# Patient Record
Sex: Male | Born: 1955
Health system: Southern US, Community
[De-identification: ages and names within clinical notes are randomized; demographics above are authoritative.]

## PROBLEM LIST (undated history)

## (undated) DIAGNOSIS — E785 Hyperlipidemia, unspecified: Secondary | ICD-10-CM

## (undated) DIAGNOSIS — I739 Peripheral vascular disease, unspecified: Secondary | ICD-10-CM

## (undated) DIAGNOSIS — E0590 Thyrotoxicosis, unspecified without thyrotoxic crisis or storm: Secondary | ICD-10-CM

## (undated) DIAGNOSIS — I4891 Unspecified atrial fibrillation: Secondary | ICD-10-CM

## (undated) DIAGNOSIS — N189 Chronic kidney disease, unspecified: Secondary | ICD-10-CM

## (undated) HISTORY — PX: OTHER SURGICAL HISTORY: SHX169

## (undated) HISTORY — PX: PERICARDIAL FLUID DRAINAGE: SHX5100

## (undated) HISTORY — PX: BOWEL RESECTION: SHX1257

## (undated) HISTORY — PX: COLOSTOMY: SHX63

## (undated) HISTORY — PX: ABDOMINAL AORTIC ANEURYSM REPAIR: SUR1152

## (undated) HISTORY — PX: GASTROSTOMY: SHX151

## (undated) HISTORY — PX: ABOVE KNEE LEG AMPUTATION: SUR20

---

## 2019-10-06 DIAGNOSIS — I714 Abdominal aortic aneurysm, without rupture, unspecified: Secondary | ICD-10-CM | POA: Insufficient documentation

## 2019-10-06 DIAGNOSIS — N184 Chronic kidney disease, stage 4 (severe): Secondary | ICD-10-CM | POA: Insufficient documentation

## 2019-10-10 DIAGNOSIS — I743 Embolism and thrombosis of arteries of the lower extremities: Secondary | ICD-10-CM | POA: Insufficient documentation

## 2019-10-23 DIAGNOSIS — Z89612 Acquired absence of left leg above knee: Secondary | ICD-10-CM | POA: Insufficient documentation

## 2020-01-30 ENCOUNTER — Other Ambulatory Visit: Payer: Self-pay

## 2020-01-30 ENCOUNTER — Encounter (HOSPITAL_COMMUNITY): Payer: Self-pay

## 2020-01-30 DIAGNOSIS — L97419 Non-pressure chronic ulcer of right heel and midfoot with unspecified severity: Secondary | ICD-10-CM | POA: Diagnosis present

## 2020-01-30 DIAGNOSIS — N184 Chronic kidney disease, stage 4 (severe): Secondary | ICD-10-CM | POA: Diagnosis present

## 2020-01-30 DIAGNOSIS — I9589 Other hypotension: Secondary | ICD-10-CM | POA: Diagnosis present

## 2020-01-30 DIAGNOSIS — N170 Acute kidney failure with tubular necrosis: Principal | ICD-10-CM | POA: Diagnosis present

## 2020-01-30 DIAGNOSIS — Z87891 Personal history of nicotine dependence: Secondary | ICD-10-CM

## 2020-01-30 DIAGNOSIS — I4891 Unspecified atrial fibrillation: Secondary | ICD-10-CM | POA: Diagnosis present

## 2020-01-30 DIAGNOSIS — I96 Gangrene, not elsewhere classified: Secondary | ICD-10-CM | POA: Diagnosis present

## 2020-01-30 DIAGNOSIS — E875 Hyperkalemia: Secondary | ICD-10-CM | POA: Diagnosis present

## 2020-01-30 DIAGNOSIS — Y846 Urinary catheterization as the cause of abnormal reaction of the patient, or of later complication, without mention of misadventure at the time of the procedure: Secondary | ICD-10-CM | POA: Diagnosis present

## 2020-01-30 DIAGNOSIS — E860 Dehydration: Secondary | ICD-10-CM | POA: Diagnosis present

## 2020-01-30 DIAGNOSIS — Z20822 Contact with and (suspected) exposure to covid-19: Secondary | ICD-10-CM | POA: Diagnosis present

## 2020-01-30 DIAGNOSIS — Z66 Do not resuscitate: Secondary | ICD-10-CM | POA: Diagnosis present

## 2020-01-30 DIAGNOSIS — T83518A Infection and inflammatory reaction due to other urinary catheter, initial encounter: Secondary | ICD-10-CM | POA: Diagnosis present

## 2020-01-30 DIAGNOSIS — E785 Hyperlipidemia, unspecified: Secondary | ICD-10-CM | POA: Diagnosis present

## 2020-01-30 DIAGNOSIS — Z79899 Other long term (current) drug therapy: Secondary | ICD-10-CM

## 2020-01-30 DIAGNOSIS — Z933 Colostomy status: Secondary | ICD-10-CM

## 2020-01-30 DIAGNOSIS — R64 Cachexia: Secondary | ICD-10-CM | POA: Diagnosis present

## 2020-01-30 DIAGNOSIS — D638 Anemia in other chronic diseases classified elsewhere: Secondary | ICD-10-CM | POA: Diagnosis present

## 2020-01-30 DIAGNOSIS — E871 Hypo-osmolality and hyponatremia: Secondary | ICD-10-CM | POA: Diagnosis present

## 2020-01-30 DIAGNOSIS — L89322 Pressure ulcer of left buttock, stage 2: Secondary | ICD-10-CM | POA: Diagnosis present

## 2020-01-30 DIAGNOSIS — E43 Unspecified severe protein-calorie malnutrition: Secondary | ICD-10-CM | POA: Diagnosis present

## 2020-01-30 DIAGNOSIS — E872 Acidosis: Secondary | ICD-10-CM | POA: Diagnosis present

## 2020-01-30 DIAGNOSIS — L89312 Pressure ulcer of right buttock, stage 2: Secondary | ICD-10-CM | POA: Diagnosis present

## 2020-01-30 DIAGNOSIS — I739 Peripheral vascular disease, unspecified: Secondary | ICD-10-CM | POA: Diagnosis present

## 2020-01-30 DIAGNOSIS — I129 Hypertensive chronic kidney disease with stage 1 through stage 4 chronic kidney disease, or unspecified chronic kidney disease: Secondary | ICD-10-CM | POA: Diagnosis present

## 2020-01-30 DIAGNOSIS — Z681 Body mass index (BMI) 19 or less, adult: Secondary | ICD-10-CM

## 2020-01-30 DIAGNOSIS — N390 Urinary tract infection, site not specified: Secondary | ICD-10-CM | POA: Diagnosis present

## 2020-01-30 DIAGNOSIS — Z89612 Acquired absence of left leg above knee: Secondary | ICD-10-CM

## 2020-01-30 NOTE — ED Triage Notes (Signed)
Pt BIB EMS from [facility]. Pt had a urinary catheter x4 months which was removed today buy urologist. Per facility pt has not voided since the catheter was removed. Pt denies pain and no obvious distention.

## 2020-01-31 ENCOUNTER — Inpatient Hospital Stay (HOSPITAL_COMMUNITY)
Admission: EM | Admit: 2020-01-31 | Discharge: 2020-02-06 | DRG: 682 | Disposition: A | Discharge status: Skilled Nursing Facility | Payer: BLUE CROSS/BLUE SHIELD | Source: Skilled Nursing Facility | Attending: Internal Medicine | Admitting: Internal Medicine

## 2020-01-31 ENCOUNTER — Encounter (HOSPITAL_COMMUNITY): Payer: Self-pay | Admitting: Emergency Medicine

## 2020-01-31 ENCOUNTER — Emergency Department (HOSPITAL_COMMUNITY): Payer: BLUE CROSS/BLUE SHIELD

## 2020-01-31 DIAGNOSIS — N170 Acute kidney failure with tubular necrosis: Secondary | ICD-10-CM | POA: Diagnosis present

## 2020-01-31 DIAGNOSIS — S31109D Unspecified open wound of abdominal wall, unspecified quadrant without penetration into peritoneal cavity, subsequent encounter: Secondary | ICD-10-CM | POA: Diagnosis not present

## 2020-01-31 DIAGNOSIS — Y846 Urinary catheterization as the cause of abnormal reaction of the patient, or of later complication, without mention of misadventure at the time of the procedure: Secondary | ICD-10-CM | POA: Diagnosis present

## 2020-01-31 DIAGNOSIS — Z79899 Other long term (current) drug therapy: Secondary | ICD-10-CM | POA: Diagnosis not present

## 2020-01-31 DIAGNOSIS — I4891 Unspecified atrial fibrillation: Secondary | ICD-10-CM | POA: Diagnosis present

## 2020-01-31 DIAGNOSIS — Z89612 Acquired absence of left leg above knee: Secondary | ICD-10-CM | POA: Diagnosis not present

## 2020-01-31 DIAGNOSIS — E43 Unspecified severe protein-calorie malnutrition: Secondary | ICD-10-CM | POA: Insufficient documentation

## 2020-01-31 DIAGNOSIS — N184 Chronic kidney disease, stage 4 (severe): Secondary | ICD-10-CM | POA: Diagnosis present

## 2020-01-31 DIAGNOSIS — R64 Cachexia: Secondary | ICD-10-CM | POA: Diagnosis present

## 2020-01-31 DIAGNOSIS — N179 Acute kidney failure, unspecified: Secondary | ICD-10-CM | POA: Diagnosis not present

## 2020-01-31 DIAGNOSIS — E872 Acidosis: Secondary | ICD-10-CM | POA: Diagnosis present

## 2020-01-31 DIAGNOSIS — Z933 Colostomy status: Secondary | ICD-10-CM | POA: Diagnosis not present

## 2020-01-31 DIAGNOSIS — R319 Hematuria, unspecified: Secondary | ICD-10-CM | POA: Diagnosis not present

## 2020-01-31 DIAGNOSIS — Z681 Body mass index (BMI) 19 or less, adult: Secondary | ICD-10-CM | POA: Diagnosis not present

## 2020-01-31 DIAGNOSIS — S3120XD Unspecified open wound of penis, subsequent encounter: Secondary | ICD-10-CM

## 2020-01-31 DIAGNOSIS — E871 Hypo-osmolality and hyponatremia: Secondary | ICD-10-CM | POA: Diagnosis present

## 2020-01-31 DIAGNOSIS — E860 Dehydration: Secondary | ICD-10-CM | POA: Diagnosis present

## 2020-01-31 DIAGNOSIS — Z932 Ileostomy status: Secondary | ICD-10-CM

## 2020-01-31 DIAGNOSIS — N19 Unspecified kidney failure: Secondary | ICD-10-CM

## 2020-01-31 DIAGNOSIS — E875 Hyperkalemia: Secondary | ICD-10-CM

## 2020-01-31 DIAGNOSIS — N390 Urinary tract infection, site not specified: Secondary | ICD-10-CM

## 2020-01-31 DIAGNOSIS — Z66 Do not resuscitate: Secondary | ICD-10-CM | POA: Diagnosis present

## 2020-01-31 DIAGNOSIS — I9589 Other hypotension: Secondary | ICD-10-CM | POA: Diagnosis present

## 2020-01-31 DIAGNOSIS — Z87891 Personal history of nicotine dependence: Secondary | ICD-10-CM | POA: Diagnosis not present

## 2020-01-31 DIAGNOSIS — T83518A Infection and inflammatory reaction due to other urinary catheter, initial encounter: Secondary | ICD-10-CM | POA: Diagnosis present

## 2020-01-31 DIAGNOSIS — L97419 Non-pressure chronic ulcer of right heel and midfoot with unspecified severity: Secondary | ICD-10-CM | POA: Diagnosis present

## 2020-01-31 DIAGNOSIS — Z20822 Contact with and (suspected) exposure to covid-19: Secondary | ICD-10-CM | POA: Diagnosis present

## 2020-01-31 DIAGNOSIS — L8990 Pressure ulcer of unspecified site, unspecified stage: Secondary | ICD-10-CM | POA: Insufficient documentation

## 2020-01-31 DIAGNOSIS — D649 Anemia, unspecified: Secondary | ICD-10-CM

## 2020-01-31 DIAGNOSIS — E785 Hyperlipidemia, unspecified: Secondary | ICD-10-CM | POA: Diagnosis present

## 2020-01-31 DIAGNOSIS — I96 Gangrene, not elsewhere classified: Secondary | ICD-10-CM

## 2020-01-31 DIAGNOSIS — I129 Hypertensive chronic kidney disease with stage 1 through stage 4 chronic kidney disease, or unspecified chronic kidney disease: Secondary | ICD-10-CM | POA: Diagnosis present

## 2020-01-31 DIAGNOSIS — R34 Anuria and oliguria: Secondary | ICD-10-CM | POA: Diagnosis not present

## 2020-01-31 DIAGNOSIS — I739 Peripheral vascular disease, unspecified: Secondary | ICD-10-CM | POA: Diagnosis present

## 2020-01-31 DIAGNOSIS — S3120XA Unspecified open wound of penis, initial encounter: Secondary | ICD-10-CM | POA: Diagnosis present

## 2020-01-31 HISTORY — DX: Thyrotoxicosis, unspecified without thyrotoxic crisis or storm: E05.90

## 2020-01-31 HISTORY — DX: Chronic kidney disease, unspecified: N18.9

## 2020-01-31 HISTORY — DX: Peripheral vascular disease, unspecified: I73.9

## 2020-01-31 HISTORY — DX: Hyperlipidemia, unspecified: E78.5

## 2020-01-31 HISTORY — DX: Unspecified atrial fibrillation: I48.91

## 2020-01-31 LAB — URINALYSIS, ROUTINE W REFLEX MICROSCOPIC
Bilirubin Urine: NEGATIVE
Glucose, UA: NEGATIVE mg/dL
Ketones, ur: NEGATIVE mg/dL
Nitrite: NEGATIVE
Protein, ur: 100 mg/dL — AB
RBC / HPF: >50 RBC/hpf — ABNORMAL HIGH (ref 0–5)
Specific Gravity, Urine: 1.012 (ref 1.005–1.030)
WBC, UA: >50 WBC/hpf — ABNORMAL HIGH (ref 0–5)
pH: 7 (ref 5.0–8.0)

## 2020-01-31 LAB — CBC WITH DIFFERENTIAL/PLATELET
Abs Immature Granulocytes: 0.11 10*3/uL — ABNORMAL HIGH (ref 0.00–0.07)
Basophils Absolute: 0.1 10*3/uL (ref 0.0–0.1)
Basophils Relative: 1 %
Eosinophils Absolute: 0.4 10*3/uL (ref 0.0–0.5)
Eosinophils Relative: 4 %
HCT: 33.4 % — ABNORMAL LOW (ref 39.0–52.0)
Hemoglobin: 10.2 g/dL — ABNORMAL LOW (ref 13.0–17.0)
Immature Granulocytes: 1 %
Lymphocytes Relative: 12 %
Lymphs Abs: 1.1 10*3/uL (ref 0.7–4.0)
MCH: 27.2 pg (ref 26.0–34.0)
MCHC: 30.5 g/dL (ref 30.0–36.0)
MCV: 89.1 fL (ref 80.0–100.0)
Monocytes Absolute: 0.5 10*3/uL (ref 0.1–1.0)
Monocytes Relative: 6 %
Neutro Abs: 6.7 10*3/uL (ref 1.7–7.7)
Neutrophils Relative %: 76 %
Platelets: 238 10*3/uL (ref 150–400)
RBC: 3.75 MIL/uL — ABNORMAL LOW (ref 4.22–5.81)
RDW: 15.9 % — ABNORMAL HIGH (ref 11.5–15.5)
WBC: 8.8 10*3/uL (ref 4.0–10.5)
nRBC: 0 % (ref 0.0–0.2)

## 2020-01-31 LAB — BASIC METABOLIC PANEL
Anion gap: 16 — ABNORMAL HIGH (ref 5–15)
Anion gap: 16 — ABNORMAL HIGH (ref 5–15)
BUN: 108 mg/dL — ABNORMAL HIGH (ref 8–23)
BUN: 123 mg/dL — ABNORMAL HIGH (ref 8–23)
CO2: 18 mmol/L — ABNORMAL LOW (ref 22–32)
CO2: 20 mmol/L — ABNORMAL LOW (ref 22–32)
Calcium: 10.5 mg/dL — ABNORMAL HIGH (ref 8.9–10.3)
Calcium: 10.8 mg/dL — ABNORMAL HIGH (ref 8.9–10.3)
Chloride: 93 mmol/L — ABNORMAL LOW (ref 98–111)
Chloride: 93 mmol/L — ABNORMAL LOW (ref 98–111)
Creatinine, Ser: 10.99 mg/dL — ABNORMAL HIGH (ref 0.61–1.24)
Creatinine, Ser: 10.34 mg/dL — ABNORMAL HIGH (ref 0.61–1.24)
GFR, Estimated: 5 mL/min — ABNORMAL LOW (ref >60)
GFR, Estimated: 5 mL/min — ABNORMAL LOW (ref >60)
Glucose, Bld: 76 mg/dL (ref 70–99)
Glucose, Bld: 59 mg/dL — ABNORMAL LOW (ref 70–99)
Potassium: 6.8 mmol/L (ref 3.5–5.1)
Potassium: 6.1 mmol/L — ABNORMAL HIGH (ref 3.5–5.1)
Sodium: 127 mmol/L — ABNORMAL LOW (ref 135–145)
Sodium: 129 mmol/L — ABNORMAL LOW (ref 135–145)

## 2020-01-31 LAB — CBG MONITORING, ED: Glucose-Capillary: 67 mg/dL — ABNORMAL LOW (ref 70–99)

## 2020-01-31 LAB — RENAL FUNCTION PANEL
Albumin: 2.9 g/dL — ABNORMAL LOW (ref 3.5–5.0)
Anion gap: 14 (ref 5–15)
BUN: 119 mg/dL — ABNORMAL HIGH (ref 8–23)
CO2: 20 mmol/L — ABNORMAL LOW (ref 22–32)
Calcium: 10.1 mg/dL (ref 8.9–10.3)
Chloride: 95 mmol/L — ABNORMAL LOW (ref 98–111)
Creatinine, Ser: 10.34 mg/dL — ABNORMAL HIGH (ref 0.61–1.24)
GFR, Estimated: 5 mL/min — ABNORMAL LOW (ref >60)
Glucose, Bld: 60 mg/dL — ABNORMAL LOW (ref 70–99)
Phosphorus: 5.6 mg/dL — ABNORMAL HIGH (ref 2.5–4.6)
Potassium: 6.5 mmol/L (ref 3.5–5.1)
Sodium: 129 mmol/L — ABNORMAL LOW (ref 135–145)

## 2020-01-31 LAB — HIV ANTIBODY (ROUTINE TESTING W REFLEX): HIV Screen 4th Generation wRfx: NONREACTIVE

## 2020-01-31 LAB — CK: Total CK: 38 U/L — ABNORMAL LOW (ref 49–397)

## 2020-01-31 LAB — SARS CORONAVIRUS 2 BY RT PCR (HOSPITAL ORDER, PERFORMED IN ~~LOC~~ HOSPITAL LAB): SARS Coronavirus 2: NEGATIVE

## 2020-01-31 MED ORDER — ONDANSETRON HCL 4 MG PO TABS
4.0000 mg | ORAL_TABLET | Freq: Four times a day (QID) | ORAL | Status: DC | PRN
  Administered 2020-02-01 – 2020-02-02 (×2): 4 mg via ORAL
  Filled 2020-01-31 (×2): qty 1

## 2020-01-31 MED ORDER — SODIUM CHLORIDE 0.9 % IV SOLN: INTRAVENOUS | Status: DC

## 2020-01-31 MED ORDER — SODIUM CHLORIDE 0.9 % IV SOLN
1.0000 g | Freq: Once | INTRAVENOUS | Status: AC
  Administered 2020-01-31: 1 g via INTRAVENOUS
  Filled 2020-01-31: qty 1

## 2020-01-31 MED ORDER — ZINC OXIDE 40 % EX OINT
TOPICAL_OINTMENT | Freq: Two times a day (BID) | CUTANEOUS | Status: DC
  Filled 2020-01-31: qty 57

## 2020-01-31 MED ORDER — INSULIN ASPART 100 UNIT/ML IV SOLN
5.0000 [IU] | Freq: Once | INTRAVENOUS | Status: AC
  Administered 2020-01-31: 5 [IU] via INTRAVENOUS

## 2020-01-31 MED ORDER — SODIUM CHLORIDE 0.9 % IV SOLN: 1.0000 g | INTRAVENOUS | Status: DC

## 2020-01-31 MED ORDER — DEXTROSE 50 % IV SOLN
50.0000 mL | Freq: Once | INTRAVENOUS | Status: AC
  Administered 2020-01-31: 50 mL via INTRAVENOUS
  Filled 2020-01-31: qty 50

## 2020-01-31 MED ORDER — ONDANSETRON HCL 4 MG/2ML IJ SOLN
4.0000 mg | Freq: Four times a day (QID) | INTRAMUSCULAR | Status: DC | PRN
  Administered 2020-02-01 – 2020-02-04 (×3): 4 mg via INTRAVENOUS
  Filled 2020-01-31 (×3): qty 2

## 2020-01-31 MED ORDER — ACETAMINOPHEN 325 MG PO TABS
650.0000 mg | ORAL_TABLET | Freq: Four times a day (QID) | ORAL | Status: DC | PRN
  Administered 2020-01-31 – 2020-02-06 (×6): 650 mg via ORAL
  Filled 2020-01-31 (×6): qty 2

## 2020-01-31 MED ORDER — ACETAMINOPHEN 650 MG RE SUPP
650.0000 mg | Freq: Four times a day (QID) | RECTAL | Status: DC | PRN
  Filled 2020-01-31: qty 1

## 2020-01-31 MED ORDER — INSULIN ASPART 100 UNIT/ML IV SOLN: 10.0000 [IU] | Freq: Once | INTRAVENOUS | Status: DC

## 2020-01-31 MED ORDER — INSULIN ASPART 100 UNIT/ML IV SOLN
10.0000 [IU] | Freq: Once | INTRAVENOUS | Status: AC
  Administered 2020-01-31: 10 [IU] via INTRAVENOUS
  Filled 2020-01-31: qty 0.1

## 2020-01-31 MED ORDER — SODIUM ZIRCONIUM CYCLOSILICATE 10 G PO PACK
10.0000 g | PACK | Freq: Once | ORAL | Status: AC
  Administered 2020-01-31: 10 g via ORAL
  Filled 2020-01-31: qty 1

## 2020-01-31 MED ORDER — DEXTROSE 5 % IV SOLN
500.0000 mg | INTRAVENOUS | Status: DC
  Administered 2020-02-01 – 2020-02-02 (×2): 500 mg via INTRAVENOUS
  Filled 2020-01-31 (×4): qty 0.5

## 2020-01-31 MED ORDER — CHLORHEXIDINE GLUCONATE CLOTH 2 % EX PADS
6.0000 | MEDICATED_PAD | Freq: Every day | CUTANEOUS | Status: DC
  Administered 2020-01-31 – 2020-02-06 (×7): 6 via TOPICAL

## 2020-01-31 MED ORDER — SODIUM CHLORIDE 0.9 % IV BOLUS
1500.0000 mL | Freq: Once | INTRAVENOUS | Status: AC
  Administered 2020-01-31: 1500 mL via INTRAVENOUS

## 2020-01-31 MED ORDER — CALCIUM GLUCONATE-NACL 1-0.675 GM/50ML-% IV SOLN
1.0000 g | Freq: Once | INTRAVENOUS | Status: AC
  Administered 2020-01-31: 1000 mg via INTRAVENOUS
  Filled 2020-01-31: qty 50

## 2020-01-31 MED ORDER — SODIUM ZIRCONIUM CYCLOSILICATE 10 G PO PACK
10.0000 g | PACK | Freq: Two times a day (BID) | ORAL | Status: AC
  Administered 2020-01-31 – 2020-02-01 (×3): 10 g via ORAL
  Filled 2020-01-31 (×3): qty 1

## 2020-01-31 MED ORDER — SODIUM BICARBONATE 8.4 % IV SOLN
50.0000 meq | Freq: Once | INTRAVENOUS | Status: AC
  Administered 2020-01-31: 50 meq via INTRAVENOUS
  Filled 2020-01-31: qty 50

## 2020-01-31 NOTE — ED Notes (Addendum)
MD Paged for lab values, [place] also notified

## 2020-01-31 NOTE — ED Notes (Signed)
Pt son "[name]" at [phone]. Would like update with results and a call at discharge. Pt gives permission.

## 2020-01-31 NOTE — Progress Notes (Addendum)
Patient seen and examined, admitted earlier this morning by Dr. Alcario Drought, please see his H&P for details, briefly patient is a 65 year old male with history of hypertension, PAD, tobacco abuse who was admitted to [facility] in mid September 2021 for repair of abdominal aortic aneurysm 9/17 this led to numerous complications including, embolization of aortic thrombus to the left lower extremity eventually requiring left above-knee amputation. -Also complicated by acute kidney injury, rhabdomyolysis requiring CRRT from 9/18 started intermittent hemodialysis on 9/23. -10/12 had cardiac arrest due to tamponade requiring pericardial drain placement -Developed ischemic bowel, emergent OR underwent small bowel resection with open abdomen, went back to the OR on 10/17 had ileostomy placed. -On 10/19 had a Foley placed developed necrosis of foreskin with slough, required blunt dissection to place Foley catheter -On 10/24 was extubated -11/1 had tunneled HD catheter placement -Then had gradual improvement in his kidney function and was transitioned off hemodialysis, creatinine was in the 3 range at discharge Eventually discharged to [facility] for rehab on 11/15 with a Foley catheter -Seen by urology in follow-up 1/20 and had Foley catheter removed after he passed a voiding trial and was discharged back to [facility], patient was unable to void 6 hours later and was sent to our ED. -In the emergency room he was noted to have BUN of 108 with a creatinine of 10.9, potassium of 6.8, renal ultrasound did not show hydronephrosis,, did have a Foley catheter placed with 200 cc urine drained, also noted to have necrotic changes in his right foot/toes. -Nephrology was consulted and he was transferred to [facility] from [place], ER.  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 Hyperkalemia -Baseline creatinine around 3 at the time of discharge -Creatinine 10.9 on admission with K of 6.8 -Lokelma ordered, repeat labs pending,  will give insulin and dextrose as well -Clinically appears dry, start normal saline at 100 mL/h, monitor urine output closely, I wonder if this is due to high output ostomy, monitor output -Renal ultrasound without hydronephrosis, CK is 93 -Nephrology consulting -Foley catheter placed in the ED overnight, will continue this, monitor I/o closely  Necrotic toes with wounds, serosanguineous discharge -weak DP pulse noted -Followed by vascular surgery at [facility], will request to VVS input  Abdominal wall wound Penis wound -Underwent small bowel resection for ischemia, R iIeostomy -Wound care consult, follow-up with VVS  History of A. Fib -Noted during recent hospitalization -In sinus rhythm now, not on anticoagulation at discharge I wonder if this was secondary to tamponade or some bleeding complications -Follow-up with cardiology at [place] associated UTI -Catheter was just removed prior to this admission, UA is abnormal, continue cefepime, follow-up urine culture  History of cardiac arrest, pericardial tamponade -Requiring pericardial window and drain  DNR  Domenic Polite, MD

## 2020-01-31 NOTE — Progress Notes (Signed)
Pharmacy Antibiotic Note  Curtis Solis is a 65 y.o. male admitted on 01/31/2020 with CAUTI; catheter was removed yesterday after being placed 22moprior.  Pharmacy has been consulted for cefepime dosing.  Of note, pt w/ acute on CKD; had been on temporary HD during previous admission but not yet on permanent HD.  Plan: Cefepime 1g IV x1 followed by '500mg'$  IV Q24H; f/u whether HD to start during admission.  Height: '5\' 11"'$  (180.3 cm) Weight: 68 kg (150 lb) IBW/kg (Calculated) : 75.3  Temp (24hrs), Avg:97.6 F (36.4 C), Min:97.5 F (36.4 C), Max:97.6 F (36.4 C)  Recent Labs  Lab 01/31/20 0236  WBC 8.8  CREATININE 10.99*    Estimated Creatinine Clearance: 6.7 mL/min (A) (by C-G formula based on SCr of 10.99 mg/dL (H)).    No Known Allergies   Thank you for allowing pharmacy to be a part of this patient's care.  VWynona Neat PharmD, BCPS  01/31/2020 6:44 AM

## 2020-01-31 NOTE — Consult Note (Addendum)
Hospital Consult    Reason for Consult: Evaluation of right foot Requesting Physician:  ED MRN #:  [id number]  History of Present Illness: This is a 65 y.o. male status post open AAA and bilateral common iliac artery aneurysm repair with aorta bi-iliac bypass graft in September of last year at [facility].  Postoperative course was complicated by acute renal failure, mesenteric ischemia, sepsis, and distal embolization to the left lower extremity ultimately requiring left above-knee amputation.  He is being seen in consultation for evaluation of right foot wounds.  He denies any pain to the toes of his right foot.  He does have pain on his right heel and has been working on avoiding pressure.  He states the toes and heel are slowly improving.  He has been admitted to the hospital due to renal failure and hyperkalemia.  Past Medical History:  Diagnosis Date  . A-fib [facility])    During hospital stay  . CKD (chronic kidney disease)   . Hyperlipidemia   . Hyperthyroidism   . PVD (peripheral vascular disease) ([place])     Past Surgical History:  Procedure Laterality Date  . ABDOMINAL AORTIC ANEURYSM REPAIR    . ABOVE KNEE LEG AMPUTATION Left   . BOWEL RESECTION     for dead bowel, s/p open abdomen  . COLOSTOMY    . GASTROSTOMY    . left leg amputation    . PERICARDIAL FLUID DRAINAGE     Placed for tamponade and removed during the [facility] hospitalization    No Known Allergies  Prior to Admission medications   Medication Sig Start Date End Date Taking? Authorizing Provider  atorvastatin (LIPITOR) 40 MG tablet Take 40 mg by mouth daily.   Yes [provider]  calcium carbonate (TUMS - DOSED IN MG ELEMENTAL CALCIUM) 500 MG chewable tablet Chew 1 tablet by mouth every 8 (eight) hours as needed for indigestion or heartburn.   Yes [provider]  diphenoxylate-atropine (LOMOTIL) 2.5-0.025 MG tablet Take 1 tablet by mouth every 6 (six) hours. 6 AM, 12 noon, 6 PM and 12 AM    Yes [provider]  gabapentin (NEURONTIN) 100 MG capsule Take 100 mg by mouth at bedtime.   Yes [provider]  hydrOXYzine (ATARAX/VISTARIL) 10 MG tablet Take 10 mg by mouth every 6 (six) hours as needed for anxiety.   Yes [provider]  ipratropium-albuterol (DUONEB) 0.5-2.5 (3) MG/3ML SOLN Take 3 mLs by nebulization every 6 (six) hours as needed (wheezing/ cough).   Yes [provider]  loperamide (IMODIUM A-D) 2 MG tablet Take 2 mg by mouth 4 (four) times daily as needed for diarrhea or loose stools.   Yes [provider]  metoprolol tartrate (LOPRESSOR) 25 MG tablet Take 12.5 mg by mouth 2 (two) times daily. 9 AM and 9 PM   Yes [provider]  OXYCODONE HCL PO Take 5 mg by mouth every 4 (four) hours as needed (pain).   Yes [provider]  pantoprazole (PROTONIX) 20 MG tablet Take 20 mg by mouth daily.   Yes [provider]  QUEtiapine (SEROQUEL) 25 MG tablet Take 12.5 mg by mouth at bedtime.   Yes [provider]  simethicone (MYLICON) 80 MG chewable tablet Chew 80 mg by mouth every 8 (eight) hours as needed for flatulence.   Yes [provider]  sodium bicarbonate 650 MG tablet Take 650 mg by mouth daily.   Yes [provider]  traMADol Veatrice Bourbon) 50  MG tablet Take 50 mg by mouth every 8 (eight) hours. 6 AM, 2 PM, 10 PM   Yes [provider]    Social History   Socioeconomic History  . Marital status: Married    Spouse name: Not on file  . Number of children: Not on file  . Years of education: Not on file  . Highest education level: Not on file  Occupational History  . Not on file  Tobacco Use  . Smoking status: Former Smoker    Quit date: 09/26/2019    Years since quitting: 0.3  . Smokeless tobacco: Not on file  Substance and Sexual Activity  . Alcohol use: Yes  . Drug use: Not on file  . Sexual activity: Not on file  Other Topics Concern  . Not on file  Social  History Narrative  . Not on file   Social Determinants of Health   Financial Resource Strain: Not on file  Food Insecurity: Not on file  Transportation Needs: Not on file  Physical Activity: Not on file  Stress: Not on file  Social Connections: Not on file  Intimate Partner Violence: Not on file     No family history on file.  ROS: Otherwise negative unless mentioned in HPI  Physical Examination  Vitals:   01/31/20 1215 01/31/20 1245  BP: (!) 91/59 102/60  Pulse:  76  Resp: 13 11  Temp:    SpO2:  100%   Body mass index is 20.92 kg/m.  General:  WDWN in NAD Gait: Not observed HENT: WNL, normocephalic Pulmonary: normal non-labored breathing, without Rales, rhonchi,  wheezing Cardiac: regular Abdomen:  soft, NT/ND, no masses Skin: without rashes Vascular Exam/Pulses: Palpable right DP and PT pulse Extremities: Left AKA well-healed; right thigh skin graft donor sites appear clean with healthy wound bed; gangrenous toes right foot and right heel ulceration with thick drainage Musculoskeletal: no muscle wasting or atrophy  Neurologic: A&O X 3;  No focal weakness or paresthesias are detected; speech is fluent/normal Psychiatric:  The pt has Normal affect. Lymph:  Unremarkable  CBC    Component Value Date/Time   WBC 8.8 01/31/2020 0236   RBC 3.75 (L) 01/31/2020 0236   HGB 10.2 (L) 01/31/2020 0236   HCT 33.4 (L) 01/31/2020 0236   PLT 238 01/31/2020 0236   MCV 89.1 01/31/2020 0236   MCH 27.2 01/31/2020 0236   MCHC 30.5 01/31/2020 0236   RDW 15.9 (H) 01/31/2020 0236   LYMPHSABS 1.1 01/31/2020 0236   MONOABS 0.5 01/31/2020 0236   EOSABS 0.4 01/31/2020 0236   BASOSABS 0.1 01/31/2020 0236    BMET    Component Value Date/Time   NA 129 (L) 01/31/2020 0923   K 6.1 (H) 01/31/2020 0923   CL 93 (L) 01/31/2020 0923   CO2 20 (L) 01/31/2020 0923   GLUCOSE 59 (L) 01/31/2020 0923   BUN 123 (H) 01/31/2020 0923   CREATININE 10.34 (H) 01/31/2020 0923   CALCIUM 10.8 (H)  01/31/2020 0923   GFRNONAA 5 (L) 01/31/2020 0923    COAGS: No results found for: INR, PROTIME   ASSESSMENT/PLAN: This is a 65 y.o. male is 4 months status post open AAA and bilateral common iliac artery aneurysm repair with complicated postoperative course.  Vascular was consulted for evaluation of right foot wounds  -Right lower extremity is well-perfused with palpable DP and PT pulses -Patient will require aggressive wound care.  Wound care will include offloading right heel; twice daily cleansing with soap and water of  right foot and toes of right foot followed by Betadine paint and gauze separating each toe which should be changed daily -Patient is aware he is at risk for partial foot amputation however we will attempt to get through current hospitalization with aggressive wound care and have patient follow up with his surgeon in [facility] as an outpatient.  Dagoberto Ligas PA-C [facility] [phone]  I have interviewed the patient and examined the patient. I agree with the findings by the PA.  He has palpable pedal pulses in the right foot.  As above we just need to keep the wounds dry and the toes separated.  Will allow this to demarcate and he will ultimately likely require transmetatarsal amputation.  He should follow-up in [facility] once he is discharged here.  Vascular surgery will be available as needed.  Gae Gallop, MD [phone]

## 2020-01-31 NOTE — ED Notes (Signed)
500 NS bag done 1000 now switched over

## 2020-01-31 NOTE — Consult Note (Addendum)
[facility] Nurse Consult Note: Patient receiving care in [place] [facility] today from [facility] Reason for Consult: Multiple wounds Wound type:  #1 Right ischium MASD/Friction/Sheer Pink, moist measures 1 cm x 1.2 cm  #2 Left ischium MASD/Friction/Sheer pink, moist 1 cm x 2.6 cm  #3 Right Heel Stage 3 related to pressure and ischemia, dry scaly skin, bleeding measures 3 cm x 4 cm. #4 Abdominal surgical wound has had skin grafts, brown, with scattered pink friable areas and brownish/yellow tissue.  #5 Right thigh skin graft donor site, pink moist #6 Right foot and toes, thickened scaly skin with necrotic toes,open wounds pink red with serosanguinous drainage, likely related to inadequate arterial blood flow to the foot. Vascular consult recommended.  #7 Healing wound on the tip of the urethral meatus of the penis Pressure Injury POA: Yes Dressing procedure/placement/frequency: #1 Clean the entire sacral region with no rinse cleaner, pat dry then apply Desitin to the area. Apply each shift or PRN soiling #2 Clean right foot thoroughly with soap and water, rinse, pat dry. Apply Aquacel Advantage Kellie Simmering # [phone]) to the toes by weaving in and out of each toe, place one piece of Aquacel on top of the toes and one piece under the toes, secure with Kerlix. Change daily. Place right foot in Prevalon heel lift boot Kellie Simmering # [phone]) #3 Apply Xeroform gauze Kellie Simmering # [phone]) over the heel, secure with Kerlix. Change daily #4 Gently clean the right thigh wound with soap and water, pat dry. Place Xeroform gauze over the wound, secure with ABD pad and Medipore tape. Change daily #5 Gently clean the abdominal wound with NS, pat dry. Apply Xeroform over the wound, secure with ABD pad and Medipore tape. Change daily.  Order and place patient on standard size air mattress.  [place] Nurse ostomy consult note This is not a new ostomy.  Stoma type/location: Right ileostomy Stomal assessment/size: 1 1/8" stoma just above skin  level.  Peristomal assessment: Pink, moist Output: thin brown Ostomy pouching: 1pc. 2 1/4" flat Kellie Simmering # [phone]) with barrier ring Kellie Simmering # [phone])  Education provided: None  Monitor the wound area(s) for worsening of condition such as: Signs/symptoms of infection, increase in size, development of or worsening of odor, development of pain, or increased pain at the affected locations.   Notify the medical team if any of these develop.  Thank you for the consult. [place] nurse will not follow at this time.   Please re-consult the [place] team if needed.  Cathlean Marseilles Tamala Julian, MSN, RN, [place], Lysle Pearl, [facility] Wound Treatment Associate Pager [phone]

## 2020-01-31 NOTE — ED Notes (Signed)
Pt given urinal and encouraged to try to void

## 2020-01-31 NOTE — ED Provider Notes (Signed)
[facility] DEPT Provider Note: Georgena Spurling, MD, FACEP  CSN: [id number] MRN: [id number] ARRIVAL: 01/30/20 at 2241 ROOM: 019C/019C   CHIEF COMPLAINT  Urinary Retention and Acute Renal Failure   HISTORY OF PRESENT ILLNESS  01/31/20 1:25 AM Curtis Solis is a 65 y.o. male who had an indwelling catheter for 4 months which was removed by his urologist in [facility] yesterday.  He was able to spontaneously void 150 mL after removal of the catheter.  Per his living facility he has not voided for 6 hours since the catheter was removed, and his urologist note indicates that if this occurred he was to be sent to the ED.  The patient denies pain or need to void and he has no obvious bladder distention.  Urologist note indicates he has a chronic open wound of the penis.   Apparently urology was consulted during hospitalization (beginning September 16, 2019) for repair of abdominal aortic aneurysm with bilateral common iliac aneurysms.  His hospital course included multiple complications including placement of colostomy, gastrostomy, and skin graft over the abdominal wound (right thigh donor site).  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  He was on temporary dialysis but is not currently on dialysis.  His only acute complaint is moderate pain in his right foot, which is bandaged; pain is worse with movement or palpation:    His son states he has had decreased appetite and oral intake since his hospital course and has lost weight.   Past Medical History:  Diagnosis Date   A-fib [facility])    During hospital stay   CKD (chronic kidney disease)    Hyperlipidemia    Hyperthyroidism    PVD (peripheral vascular disease) ([place])     Past Surgical History:  Procedure Laterality Date   ABDOMINAL AORTIC ANEURYSM REPAIR     ABOVE KNEE LEG AMPUTATION Left    BOWEL RESECTION     for dead bowel, s/p open abdomen   COLOSTOMY     GASTROSTOMY     left leg  amputation     PERICARDIAL FLUID DRAINAGE     Placed for tamponade and removed during the [facility] hospitalization    No family history on file.  Social History   Tobacco Use   Smoking status: Former Smoker    Quit date: 09/26/2019    Years since quitting: 0.3  Substance Use Topics   Alcohol use: Yes    Prior to Admission medications   Medication Sig Start Date End Date Taking? Authorizing Provider  atorvastatin (LIPITOR) 40 MG tablet Take 40 mg by mouth daily.   Yes [provider]  calcium carbonate (TUMS - DOSED IN MG ELEMENTAL CALCIUM) 500 MG chewable tablet Chew 1 tablet by mouth every 8 (eight) hours as needed for indigestion or heartburn.   Yes [provider]  diphenoxylate-atropine (LOMOTIL) 2.5-0.025 MG tablet Take 1 tablet by mouth every 6 (six) hours. 6 AM, 12 noon, 6 PM and 12 AM   Yes [provider]  gabapentin (NEURONTIN) 100 MG capsule Take 100 mg by mouth at bedtime.   Yes [provider]  hydrOXYzine (ATARAX/VISTARIL) 10 MG tablet Take 10 mg by mouth every 6 (six) hours as needed for anxiety.   Yes [provider]  ipratropium-albuterol (DUONEB) 0.5-2.5 (3) MG/3ML SOLN Take 3 mLs by nebulization every 6 (six) hours as needed (wheezing/ cough).   Yes [provider]  loperamide (IMODIUM A-D) 2 MG  tablet Take 2 mg by mouth 4 (four) times daily as needed for diarrhea or loose stools.   Yes [provider]  metoprolol tartrate (LOPRESSOR) 25 MG tablet Take 12.5 mg by mouth 2 (two) times daily. 9 AM and 9 PM   Yes [provider]  OXYCODONE HCL PO Take 5 mg by mouth every 4 (four) hours as needed (pain).   Yes [provider]  pantoprazole (PROTONIX) 20 MG tablet Take 20 mg by mouth daily.   Yes [provider]  QUEtiapine (SEROQUEL) 25 MG tablet Take 12.5 mg by mouth at bedtime.   Yes [provider]  simethicone (MYLICON) 80 MG chewable tablet Chew 80 mg by mouth every 8  (eight) hours as needed for flatulence.   Yes [provider]  sodium bicarbonate 650 MG tablet Take 650 mg by mouth daily.   Yes [provider]  traMADol (ULTRAM) 50 MG tablet Take 50 mg by mouth every 8 (eight) hours. 6 AM, 2 PM, 10 PM   Yes [provider]    Allergies Patient has no known allergies.   REVIEW OF SYSTEMS  Negative except as noted here or in the History of Present Illness.   PHYSICAL EXAMINATION  Initial Vital Signs Blood pressure 124/63, pulse 84, temperature 97.6 F (36.4 C), temperature source Oral, resp. rate 16, SpO2 100 %.  Examination General: Well-developed, cachectic male in no acute distress; appearance consistent with age of record HENT: normocephalic; atraumatic Eyes: pupils equal, round and reactive to light; extraocular muscles intact; arcus senilis Neck: supple Heart: regular rate and rhythm Lungs: clear to auscultation bilaterally Abdomen: soft; nondistended; bowel sounds present; gastrostomy present in the left upper quadrant; colostomy present in the right lower quadrant; chronically healing wound of mid abdomen with skin graft:    GU: Tanner V male, uncircumcised; healing wound of urethral meatus and glans penis; no tenderness or distention of bladder, bedside bladder scan shows about 115 mL of retained urine Extremities: Left AKA; healing skin graft donor site right anterior thigh without signs of infection:    Necrosis of toes of right foot with thickened, scaly skin and superficial ulcer of heel, DP pulse +1:      Neurologic: Awake, alert and oriented; motor function intact in all extremities and symmetric; no facial droop Skin: Warm and dry Psychiatric: Normal mood and affect   RESULTS  Summary of this visit's results, reviewed and interpreted by myself:   EKG Interpretation  Date/Time:  Friday January 31 2020 04:01:38 EST Ventricular Rate:  67 PR Interval:    QRS Duration: 135 QT  Interval:  406 QTC Calculation: 429 R Axis:   -32 Text Interpretation: Sinus rhythm Left bundle branch block Artifact in lead(s) I II III aVR aVL aVF V1 V5 No previous ECGs available Confirmed by Shanon Rosser [phone]) on 01/31/2020 4:15:37 AM      Laboratory Studies: Results for orders placed or performed during the hospital encounter of 01/31/20 (from the past 24 hour(s))  CBC with Differential/Platelet     Status: Abnormal   Collection Time: 01/31/20  2:36 AM  Result Value Ref Range   WBC 8.8 4.0 - 10.5 K/uL   RBC 3.75 (L) 4.22 - 5.81 MIL/uL   Hemoglobin 10.2 (L) 13.0 - 17.0 g/dL   HCT 33.4 (L) 39.0 - 52.0 %   MCV 89.1 80.0 - 100.0 fL   MCH 27.2 26.0 - 34.0 pg   MCHC 30.5 30.0 - 36.0 g/dL   RDW 15.9 (  H) 11.5 - 15.5 %   Platelets 238 150 - 400 K/uL   nRBC 0.0 0.0 - 0.2 %   Neutrophils Relative % 76 %   Neutro Abs 6.7 1.7 - 7.7 K/uL   Lymphocytes Relative 12 %   Lymphs Abs 1.1 0.7 - 4.0 K/uL   Monocytes Relative 6 %   Monocytes Absolute 0.5 0.1 - 1.0 K/uL   Eosinophils Relative 4 %   Eosinophils Absolute 0.4 0.0 - 0.5 K/uL   Basophils Relative 1 %   Basophils Absolute 0.1 0.0 - 0.1 K/uL   Immature Granulocytes 1 %   Abs Immature Granulocytes 0.11 (H) 0.00 - 0.07 K/uL  Basic metabolic panel     Status: Abnormal   Collection Time: 01/31/20  2:36 AM  Result Value Ref Range   Sodium 127 (L) 135 - 145 mmol/L   Potassium 6.8 (HH) 3.5 - 5.1 mmol/L   Chloride 93 (L) 98 - 111 mmol/L   CO2 18 (L) 22 - 32 mmol/L   Glucose, Bld 76 70 - 99 mg/dL   BUN 108 (H) 8 - 23 mg/dL   Creatinine, Ser 10.99 (H) 0.61 - 1.24 mg/dL   Calcium 10.5 (H) 8.9 - 10.3 mg/dL   GFR, Estimated 5 (L) >60 mL/min   Anion gap 16 (H) 5 - 15  CK     Status: Abnormal   Collection Time: 01/31/20  2:36 AM  Result Value Ref Range   Total CK 38 (L) 49 - 397 U/L  SARS Coronavirus 2 by RT PCR (hospital order, performed in [place] hospital lab) Nasopharyngeal Nasopharyngeal Swab     Status: None   Collection Time:  01/31/20  3:45 AM   Specimen: Nasopharyngeal Swab  Result Value Ref Range   SARS Coronavirus 2 NEGATIVE NEGATIVE  Urinalysis, Routine w reflex microscopic Urine, Catheterized     Status: Abnormal   Collection Time: 01/31/20  5:39 AM  Result Value Ref Range   Color, Urine YELLOW YELLOW   APPearance CLOUDY (A) CLEAR   Specific Gravity, Urine 1.012 1.005 - 1.030   pH 7.0 5.0 - 8.0   Glucose, UA NEGATIVE NEGATIVE mg/dL   Hgb urine dipstick LARGE (A) NEGATIVE   Bilirubin Urine NEGATIVE NEGATIVE   Ketones, ur NEGATIVE NEGATIVE mg/dL   Protein, ur 100 (A) NEGATIVE mg/dL   Nitrite NEGATIVE NEGATIVE   Leukocytes,Ua LARGE (A) NEGATIVE   RBC / HPF >50 (H) 0 - 5 RBC/hpf   WBC, UA >50 (H) 0 - 5 WBC/hpf   Bacteria, UA MANY (A) NONE SEEN   WBC Clumps PRESENT    Hyaline Casts, UA PRESENT    Imaging Studies: US RENAL  Result Date: 01/31/2020 CLINICAL DATA:  Initial evaluation for a KI, evaluate for obstructive uropathy/urinary retention. EXAM: RENAL / URINARY TRACT ULTRASOUND COMPLETE COMPARISON:  None available. FINDINGS: Right Kidney: Renal measurements: 10.6 x 4.6 x 5.2 cm = volume: 134 mL. Mildly increased echogenicity within the renal parenchyma. No nephrolithiasis or hydronephrosis. 3.2 cm simple cyst present at the upper pole. Left Kidney: Renal measurements: 11.5 x 4.5 x 4.4 cm = volume: 117 mL. Increased echogenicity within the renal parenchyma. No nephrolithiasis or hydronephrosis. 2.3 cm benign appearing cyst present at the upper pole. Additional 2.6 cm simple cyst present at the lower pole. Bladder: Diffuse irregularity of the bladder wall is seen. While this could reflect trabeculation related incomplete distension, a possible focal bladder mass is difficult to exclude (image 56). Prevoid volume measures 181 cc. Bilateral ureteral  jets are visualized. Other: None. IMPRESSION: 1. Increased echogenicity within the renal parenchyma, consistent with medical renal disease. 2. No hydronephrosis. 3.  Multifocal irregularity of the bladder wall. While this finding may reflect trabeculation related to incomplete distension, a possible focal bladder mass is difficult to exclude based on this appearance. Further assessment with dedicated cross-sectional imaging/CT urogram suggested for further evaluation. 4. Few scattered benign appearing renal cysts as above. Electronically Signed   By: Jeannine Boga M.D.   On: 01/31/2020 06:05   DG Foot Complete Right  Result Date: 01/31/2020 CLINICAL DATA:  Necrosis EXAM: RIGHT FOOT COMPLETE - 3+ VIEW COMPARISON:  None. FINDINGS: Hallux valgus. Degenerative change in the first MTP joint. Prior bunionectomy. Negative for fracture. No acute skeletal abnormality. Mild calcaneal spurring at the Achilles insertion. IMPRESSION: No acute abnormality. Electronically Signed   By: Franchot Gallo M.D.   On: 01/31/2020 02:47    ED COURSE and MDM  Nursing notes, initial and subsequent vitals signs, including pulse oximetry, reviewed and interpreted by myself.  Vitals:   01/31/20 0300 01/31/20 0408 01/31/20 0500 01/31/20 0635  BP: (!) 120/94 (!) 127/94 105/69   Pulse: 86 88 88   Resp: '16 11 14   '$ Temp:    (!) 97.5 F (36.4 C)  TempSrc:    Oral  SpO2: 98% 100% 100% 100%   Medications  acetaminophen (TYLENOL) tablet 650 mg (has no administration in time range)    Or  acetaminophen (TYLENOL) suppository 650 mg (has no administration in time range)  ondansetron (ZOFRAN) tablet 4 mg (has no administration in time range)    Or  ondansetron (ZOFRAN) injection 4 mg (has no administration in time range)  sodium zirconium cyclosilicate (LOKELMA) packet 10 g (10 g Oral Given 01/31/20 0411)  calcium gluconate 1 g/ 50 mL sodium chloride IVPB (0 g Intravenous Stopped 01/31/20 0502)  insulin aspart (novoLOG) injection 10 Units (10 Units Intravenous Given 01/31/20 0402)  dextrose 50 % solution 50 mL (50 mLs Intravenous Given 01/31/20 0403)  sodium bicarbonate injection 50 mEq  (50 mEq Intravenous Given 01/31/20 0351)   3:44 AM The patient is clearly in renal failure. This is likely the cause of his anuria as opposed to obstruction as his bladder is decompressed and he has no sensation of urgency to urinate. We will give Lokelma as well as other IV medications for acute treatment of hyperkalemia and have admitted with anticipation of dialysis.  5:22 AM Dr. Alcario Drought in to see patient.  Renal ultrasound obtained showing no hydronephrosis and only about 185 mL of urine in the bladder.  I do not believe his acute renal failure is due to obstruction.  Foley catheter placed by nursing staff.  Only about 50 mL of urine obtained.  This was sent for urinalysis.   5:36 AM Dr. Dayna Barker, EDP at [facility] accepts for ED to ED transfer.  Dr. Marval Regal consulted for nephrology as patient will need emergent dialysis.  6:33 AM Urinalysis shows hematuria and pyuria consistent with a urinary tract infection.  Dr. Alcario Drought will order Rocephin.  Urine sent for culture.  [facility] en [facility] to [facility].  CK is only 38, not consistent with rhabdomyolysis despite necrotic-appearing foot.  PROCEDURES  Procedures   CRITICAL CARE Performed by: Karen Chafe Kidus Delman Total critical care time: 60 minutes Critical care time was exclusive of separately billable procedures and treating other patients. Critical care was necessary to treat or prevent imminent or life-threatening deterioration. Critical care was time spent  personally by me on the following activities: development of treatment plan with patient and/or surrogate as well as nursing, discussions with consultants, evaluation of patient's response to treatment, examination of patient, obtaining history from patient or surrogate, ordering and performing treatments and interventions, ordering and review of laboratory studies, ordering and review of radiographic studies, pulse oximetry and re-evaluation of patient's condition.   ED DIAGNOSES      ICD-10-CM   1. Acute renal failure with oliguria (HCC)  N17.9    R34   2. Hyperkalemia  E87.5   3. Hyponatremia  E87.1   4. Necrotic toes ([place])  I96   5. Normocytic anemia  D64.9   6. Acute kidney failure (HCC)  N17.9 US RENAL    US RENAL  7. Urinary tract infection with hematuria, site unspecified  N39.0    R31.9        Ramona Ruark, Jenny Reichmann, MD 01/31/20 [phone]

## 2020-01-31 NOTE — ED Triage Notes (Addendum)
Pt arrived via [facility] from [facility] with complaints from a triple a repair. This surgery has caused the pt to have circulatory issues. The circulatory issue has cause the pt to have a necrotic right foot that is actively bleeding. Pt has a left bka.Pt has a ostomy and g tube, but can take medications orally. Pt also has several grafts to his stomach.  Pt is alert and orientated x 4. Pt is a new dialysis pt, never has been dialyzed.Pt has had a high potassium of 6.8,creatinine of 10.Karen Chafe reports pts vitals have been stable.

## 2020-01-31 NOTE — ED Notes (Addendum)
Nephrologist in room. Mouth swabbed with sponge

## 2020-01-31 NOTE — ED Notes (Signed)
Date and time results received: 01/31/20 0326 (use smartphrase ".now" to insert current time)  Test: Potassium  Critical Value: 6.8  Name of Provider Notified: [place], MD   Orders Received? Or Actions Taken?: Orders Received - See Orders for details

## 2020-01-31 NOTE — ED Notes (Addendum)
Plced in hospital; bed. Foley bag replaced and urine charted

## 2020-01-31 NOTE — ED Notes (Signed)
[facility] present for pt transfer.

## 2020-01-31 NOTE — ED Notes (Signed)
Wound care team  In room now

## 2020-01-31 NOTE — ED Notes (Signed)
200 cc return from foley insertion

## 2020-01-31 NOTE — ED Notes (Signed)
Visually checked on Pt. He awoke when I came into the room, Normal rbretating pattern. Vitals WDl

## 2020-01-31 NOTE — ED Notes (Signed)
Portable Equipment paged @ 0817-per MD paged by Thomasine Klutts-ordered air mattress bed.

## 2020-01-31 NOTE — ED Notes (Signed)
Sent msg and paged attending related to new lab values

## 2020-01-31 NOTE — ED Notes (Signed)
Spoke to attending on phone and she is aware of lab values

## 2020-01-31 NOTE — ED Notes (Signed)
Supplies ordered @ 0842-per MD ordered by Lysle Yero-Aquacel (5), Rings(5), Ostomy Pouch(5), Prevalon Boot (1).

## 2020-01-31 NOTE — H&P (Addendum)
History and Physical    Curtis Solis [id number] DOB: [birth year] DOA: 01/31/2020  PCP: [name], MD  Patient coming from: Home  I have personally briefly reviewed patient's old medical records in [place]  Chief Complaint: Poor urine output  HPI: Curtis Solis is a 65 y.o. male with medical history significant of HTN, prior smoking.  Pt recently had admission to [facility] in mid Sept 2021 for repair of 5.8cm AAA.  Unfortunately this ended up being a long 2 month admission with numerous complications including: 1) Ischemic L limb s/p L AKA 2) Ischemic bowel s/p resection(s) and ostomy creation-> open abdomen -> skin graft 3) cardiac arrest due to tamponade, pericardial drain placement and removal during admission. 4) acute kidney failure -> CRRT -> iHD -> renal recovery somewhat with discharge creat of ~3. 5) urinary retention with foley catheter in place up until removal yesterday. 6) ischemia of R foot   Rundown of admission is as follows:  9/17 - Open AAA repair c/b ischemic limb/AKI 2/2 rhabdo 9/18 - MRI for decreased BLE motor- negative for acute process; Nephrology consult for AKI--CRRT started 9/20 - afib w/ RVR 9/23 - started on iHD 9/30 - OR for L AKA 10/3 - suspected sepsis, hemodynamic instability, hypoxia, on pressors, covid swab, CRRT, intubated, cardiology consult. Bronch cultures sent- positive for MRSA 10/07: C. Diff detected on sample- rectal treatment initiated. 10/11 - back in a flutter 10/12 - cardiac arrest due to tamponade, pericardial drain placed,  Emergent OR with general surgery for small bowel resection for ischemic bowel, open abdomen  10/14 - OR takeback, abdomen left open, palliative consulted 10/15: pericardial drain removed 10/17 - OR with general surgery, ileostomy, abthera placement  10/19: Urology to bedside for foley placement, foreskin with necrosis and slough, blunt dissection to place foley and remove necrotic tissue,  purulent drainage expelled. CT completed to r/o necrotizing infection- negative, no further interventions needed. 10/20 - abdominal fascial closure with mesh and wound vac, G tube placement 10/23- DNR/DNI per family 10/24 - extubated 10/27 - Started iHD 11/01 - Successful placement of tunneled iHD line. 11/4-fever to 38.5x2, started empirically on daptomycin, ceftazidime, and Flagyl. Chest x-ray negative, UA negative, blood cultures drawn no growth to date. Venous duplex negative for DVT. Anti-E was identified by blood bank immunohematoloy and all blood products MUST be crossmatched for E-antigen negative units 11/5: new RUQ pain, RUQUS showing biliary sludge w/ gallbladder wall thickening, CT w/out contrast leak. Gen Surg consulted 11/6: Had a scan ordered given equivocal right upper quadrant ultrasound, negative for acute cholecystitis. Abd pain resolved 11/7: Stopped broad-spectrum antibiotics 11/11: fever x 1, workup again negative for acute process including repeat respiratory panel.  11/12 CT AP completed,to eval bowel with contrast through G-tube and rectal tube: findings Of no evidence of extraluminal leakage of contrast from the bowel loops. Gallbladder thickening with pericholecystic fluid is likely reactive. There is no evidence of cholelithiasis. Prophylactic antibiotics x 5 days completed 11/15  At time of recent clinic follow up on 12/29, pt eating by mouth, no longer using the feeding tube, had sacral wound but this was improving, penile wound was healing though foley remained in place, was having R foot pain though.  Yesterday pt went to urologist at [facility] and finally had foley catheter removed.  He was able to urinate out 150cc today.  Doesn't feel any abd pain, distention, or need to use the bathroom but due to low UOP he was told he should present to  the ED.  Pt denies fevers, chills, abd pain, N/V/D.  Open wound of penis is healing.  Does have moderate pain of R foot,  worse with movement or palpation.  ED Course: Unfortunately pt is in AKF with creat of 10.99 (up from 2-3 range in mid Nov on discharge from [facility], visible in care everywhere), BUN 108, K 6.8.  Does NOT appear to be obstructive uropathy: Bladder scan just 110cc Renal US: 180cc, no hydro Finally: put back in foley, just 200cc UOP.  Also has necrotic toes of foot (see below pictures).  AKF does NOT appear to be rhabdo either this time around (CK only 38).  Pt given calcium, dextrose, insulin, bicarb, lokelma.  Nephro consulted and pt being transferred emergently to [facility].   Review of Systems: As per HPI, otherwise all review of systems negative.  Past Medical History:  Diagnosis Date  . A-fib [facility])    During hospital stay  . CKD (chronic kidney disease)   . Hyperlipidemia   . Hyperthyroidism   . PVD (peripheral vascular disease) ([place])     Past Surgical History:  Procedure Laterality Date  . ABDOMINAL AORTIC ANEURYSM REPAIR    . ABOVE KNEE LEG AMPUTATION Left   . BOWEL RESECTION     for dead bowel, s/p open abdomen  . COLOSTOMY    . GASTROSTOMY    . left leg amputation    . PERICARDIAL FLUID DRAINAGE     Placed for tamponade and removed during the [facility] hospitalization     reports that he quit smoking about 4 months ago. He does not have any smokeless tobacco history on file. He reports current alcohol use. No history on file for drug use.  No Known Allergies  No family history on file. 2 sons are alive and healthy.  No sick contacts.  Prior to Admission medications   Medication Sig Start Date End Date Taking? Authorizing Provider  atorvastatin (LIPITOR) 40 MG tablet Take 40 mg by mouth daily.   Yes [provider]  calcium carbonate (TUMS - DOSED IN MG ELEMENTAL CALCIUM) 500 MG chewable tablet Chew 1 tablet by mouth every 8 (eight) hours as needed for indigestion or heartburn.   Yes [provider]  diphenoxylate-atropine (LOMOTIL) 2.5-0.025 MG tablet  Take 1 tablet by mouth every 6 (six) hours. 6 AM, 12 noon, 6 PM and 12 AM   Yes [provider]  gabapentin (NEURONTIN) 100 MG capsule Take 100 mg by mouth at bedtime.   Yes [provider]  hydrOXYzine (ATARAX/VISTARIL) 10 MG tablet Take 10 mg by mouth every 6 (six) hours as needed for anxiety.   Yes [provider]  ipratropium-albuterol (DUONEB) 0.5-2.5 (3) MG/3ML SOLN Take 3 mLs by nebulization every 6 (six) hours as needed (wheezing/ cough).   Yes [provider]  loperamide (IMODIUM A-D) 2 MG tablet Take 2 mg by mouth 4 (four) times daily as needed for diarrhea or loose stools.   Yes [provider]  metoprolol tartrate (LOPRESSOR) 25 MG tablet Take 12.5 mg by mouth 2 (two) times daily. 9 AM and 9 PM   Yes [provider]  OXYCODONE HCL PO Take 5 mg by mouth every 4 (four) hours as needed (pain).   Yes [provider]  pantoprazole (PROTONIX) 20 MG tablet Take 20 mg by mouth daily.   Yes [provider]  QUEtiapine (SEROQUEL) 25 MG tablet Take 12.5 mg by mouth at bedtime.   Yes [provider]  simethicone (MYLICON) 80 MG chewable tablet Chew 80 mg by mouth every 8 (eight) hours as needed for flatulence.   Yes [provider]  sodium bicarbonate 650 MG tablet Take 650 mg by mouth daily.   Yes [provider]  traMADol (ULTRAM) 50 MG tablet Take 50 mg by mouth every 8 (eight) hours. 6 AM, 2 PM, 10 PM   Yes [provider]    Physical Exam: Vitals:   01/31/20 0136 01/31/20 0300 01/31/20 0408 01/31/20 0500  BP: 103/69 (!) 120/94 (!) 127/94 105/69  Pulse: 85 86 88 88  Resp: '16 16 11 14  '$ Temp:      TempSrc:      SpO2: 100% 98% 100% 100%    Constitutional: NAD, calm, comfortable Eyes: PERRL, lids and conjunctivae normal ENMT: Mucous membranes are moist. Posterior pharynx clear of any exudate or lesions.Normal dentition.  Neck: normal, supple, no masses, no thyromegaly Respiratory:  clear to auscultation bilaterally, no wheezing, no crackles. Normal respiratory effort. No accessory muscle use.  Cardiovascular: Regular rate and rhythm, no murmurs / rubs / gallops. No extremity edema. 2+ pedal pulses. No carotid bruits.  Abdomen: no tenderness, no masses palpated. No hepatosplenomegaly. Bowel sounds positive. See pictures below Musculoskeletal: Left AKA; healing skin graft donor site right anterior thigh without signs of infection Skin:    Necrosis of toes of right foot with thickened, scaly skin and superficial ulcer of heel, DP pulse +1:      Neurologic: CN 2-12 grossly intact. Sensation intact, DTR normal. Strength 5/5 in all 4.  Psychiatric: Normal judgment and insight. Alert and oriented x 3. Normal mood.  GU: Tanner V male, uncircumcised; healing wound of urethral meatus and glans penis; no tenderness or distention of bladder, bedside bladder scan shows about 115 mL of retained urine.  Renal US shows no hydronephrosis of either kidney and ~163m retained urine.    Labs on Admission: I have personally reviewed following labs and imaging studies  CBC: Recent Labs  Lab 01/31/20 0236  WBC 8.8  NEUTROABS 6.7  HGB 10.2*  HCT 33.4*  MCV 89.1  PLT 299991111  Basic Metabolic Panel: Recent Labs  Lab 01/31/20 0236  NA 127*  K 6.8*  CL 93*  CO2 18*  GLUCOSE 76  BUN 108*  CREATININE 10.99*  CALCIUM 10.5*   GFR: CrCl cannot be calculated (Unknown ideal weight.). Liver Function Tests: No results for input(s): AST, ALT, ALKPHOS, BILITOT, PROT, ALBUMIN in the last 168 hours. No results for input(s): LIPASE, AMYLASE in the last 168 hours. No results for input(s): AMMONIA in the last 168 hours. Coagulation Profile: No results for input(s): INR, PROTIME in the last 168 hours. Cardiac Enzymes: Recent Labs  Lab 01/31/20 0236  CKTOTAL 38*   BNP (last 3 results) No results for input(s): PROBNP in the last 8760 hours. HbA1C: No results for input(s): HGBA1C  in the last 72 hours. CBG: No results for input(s): GLUCAP in the last 168 hours. Lipid Profile: No results for input(s): CHOL, HDL, LDLCALC, TRIG, CHOLHDL, LDLDIRECT in the last 72 hours. Thyroid Function Tests: No results for input(s): TSH, T4TOTAL, FREET4, T3FREE, THYROIDAB in the last 72 hours. Anemia Panel: No results for input(s): VITAMINB12, FOLATE, FERRITIN, TIBC, IRON, RETICCTPCT in the last 72 hours. Urine analysis:    Component Value Date/Time   COLORURINE YELLOW 01/31/2020 0539   APPEARANCEUR CLOUDY (A) 01/31/2020 0539   LABSPEC 1.012 01/31/2020 0539   PHURINE 7.0 01/31/2020 0539   GLUCOSEU NEGATIVE  01/31/2020 0539   HGBUR LARGE (A) 01/31/2020 0539   BILIRUBINUR NEGATIVE 01/31/2020 [place] 01/31/2020 0539   PROTEINUR 100 (A) 01/31/2020 0539   NITRITE NEGATIVE 01/31/2020 0539   LEUKOCYTESUR LARGE (A) 01/31/2020 0539    Radiological Exams on Admission: US RENAL  Result Date: 01/31/2020 CLINICAL DATA:  Initial evaluation for a KI, evaluate for obstructive uropathy/urinary retention. EXAM: RENAL / URINARY TRACT ULTRASOUND COMPLETE COMPARISON:  None available. FINDINGS: Right Kidney: Renal measurements: 10.6 x 4.6 x 5.2 cm = volume: 134 mL. Mildly increased echogenicity within the renal parenchyma. No nephrolithiasis or hydronephrosis. 3.2 cm simple cyst present at the upper pole. Left Kidney: Renal measurements: 11.5 x 4.5 x 4.4 cm = volume: 117 mL. Increased echogenicity within the renal parenchyma. No nephrolithiasis or hydronephrosis. 2.3 cm benign appearing cyst present at the upper pole. Additional 2.6 cm simple cyst present at the lower pole. Bladder: Diffuse irregularity of the bladder wall is seen. While this could reflect trabeculation related incomplete distension, a possible focal bladder mass is difficult to exclude (image 56). Prevoid volume measures 181 cc. Bilateral ureteral jets are visualized. Other: None. IMPRESSION: 1. Increased echogenicity  within the renal parenchyma, consistent with medical renal disease. 2. No hydronephrosis. 3. Multifocal irregularity of the bladder wall. While this finding may reflect trabeculation related to incomplete distension, a possible focal bladder mass is difficult to exclude based on this appearance. Further assessment with dedicated cross-sectional imaging/CT urogram suggested for further evaluation. 4. Few scattered benign appearing renal cysts as above. Electronically Signed   By: Jeannine Boga M.D.   On: 01/31/2020 06:05   DG Foot Complete Right  Result Date: 01/31/2020 CLINICAL DATA:  Necrosis EXAM: RIGHT FOOT COMPLETE - 3+ VIEW COMPARISON:  None. FINDINGS: Hallux valgus. Degenerative change in the first MTP joint. Prior bunionectomy. Negative for fracture. No acute skeletal abnormality. Mild calcaneal spurring at the Achilles insertion. IMPRESSION: No acute abnormality. Electronically Signed   By: Franchot Gallo M.D.   On: 01/31/2020 02:47    EKG: Independently reviewed.  Assessment/Plan Principal Problem:   Acute kidney failure (HCC) Active Problems:   Hyperkalemia   Uremia   Necrotic toes (HCC)   Colostomy present (HCC)   Open abdominal wall wound, subsequent encounter   Open wound of penis    1. AKF - with hyperkalemia and uremia 1. Does not appear to be obstructive uropathy: 1. Foley only 200cc UOP after placement 2. No hydro on [place] prior to foley placement and only ~180 estimated in bladder prior to placement 3. US shows "medical renal disease" 2. Has gotten temporarizing measures for K here in ED 1. Repeat BMP ordered for 0700 2. Tele monitor 3. Pt being transferred emergently to [facility] for nephro consult and possible dialysis. 4. EDP spoke with Dr. Arty Baumgartner who will let his day team know. 5. Let pt and family know that he was going to [facility], and why. 6. Keeping pt NPO and no anticoagulants for possible vas-cath placement. 2. Necrotic toes - 1. Does have 1+ DP pulse in  that foot 2. Call vascular surgery / ortho surgery in AM 3. Wound care consult 4. Issue on the "back-burner" for the moment though as issue #1 takes priority. 3. Abd wall wound, penis wound - 1. Wound care consult 2. May or may not want to get plastic involved, but above issues take precedence obviously. 4. H/o A.Fib - during hospital stay 1. Sounds like he was supposed to be started on eliquis based  on Vasc surg office note 12/29 2. But doesn't look like he is right now based on home meds. 3. Still needs to f/u with cards 4. In NSR at the moment though 5. CAUTI - 1. UA suggestive of UTI 2. Empiric cefepime 3. Culture pending.  DVT prophylaxis: None for the moment - Needs vas cath, LLE amputation, and RLE doesn't look like its got great vascular status Code Status: DNR - confirmed with son [name] Family Communication: Spoke with son [name] on phone: [phone] Disposition Plan: Home after: 1) renal issues / dialysis need addressed, 2) foot necrosis addressed, 3) abdominal wound addressed Consults called: Dr. Arty Baumgartner  Admission status: Admit to inpatient  Severity of Illness: The appropriate patient status for this patient is INPATIENT. Inpatient status is judged to be reasonable and necessary in order to provide the required intensity of service to ensure the patient's safety. The patient's presenting symptoms, physical exam findings, and initial radiographic and laboratory data in the context of their chronic comorbidities is felt to place them at high risk for further clinical deterioration. Furthermore, it is not anticipated that the patient will be medically stable for discharge from the hospital within 2 midnights of admission. The following factors support the patient status of inpatient.   IP status due to AKF with creat of 10.99! BUN 108, life threatening hyperkalemia of 6.8.   * I certify that at the point of admission it is my clinical judgment that the patient will  require inpatient hospital care spanning beyond 2 midnights from the point of admission due to high intensity of service, high risk for further deterioration and high frequency of surveillance required.*    Berthe Oley M. DO [facility]  How to contact the [facility] Attending or Consulting provider [place] or covering provider during after hours [place], for this patient?  1. Check the care team in [facility] and look for a) attending/consulting [facility] provider listed and b) the [facility] team listed 2. Log into [email or web address]  Am[employer] Physician Scheduling and messaging for groups and whole hospitals  On call and physician scheduling software for group practices, residents, hospitalists and other medical providers for call, clinic, rotation and shift schedules. On[employer] is a hospital-wide system for scheduling doctors and paging doctors on call. EasyPlot is for scientific plotting and data analysis.  [email or web address]  and use [place]'s universal password to access. If you do not have the password, please contact the hospital operator.  3. Locate the [facility] provider you are looking for under [facility] and page to a number that you can be directly reached. 4. If you still have difficulty reaching the provider, please page the [facility] (Director on Call) for the Hospitalists listed on amion for assistance.  01/31/2020, 6:10 AM

## 2020-01-31 NOTE — Consult Note (Addendum)
[name]  HISTORY AND PHYSICAL  Curtis Solis is an 65 y.o. male.    Chief Complaint: Poor urine output HPI: Curtis Solis is a 65 year old man living with HLD, HTN, urinary retention and penile wound with removal of indwelling catheter 1/20, left ischemic limb s/p left AKA since 09/2019, recent admission at neighboring healthcare system for repair of 5.8 cm AAA and had multiple complications including acute kidney failure, and now living with CKD stage 4. Possibly mild CKD leading up to aforementioned admission.  He was on HD and iHD during his stay and Cr at discharge was 2-3. Patient was discharged to [facility] He report not eating or drinking well the last 2 weeks. He has been taking Zofran daily to help with nausea. He endorses some burning with urination the past week.  He medication is managed for him at facility. He was told at his urology appointment to come to ED if he had low urine output. He has an ostomy bag and denies any recent problems with it.  He denies any fevers, chills, or use of NSAIDS.  PMH: Past Medical History:  Diagnosis Date  . A-fib [facility])    During hospital stay  . CKD (chronic kidney disease)   . Hyperlipidemia   . Hyperthyroidism   . PVD (peripheral vascular disease) (HCC)    PSH: Past Surgical History:  Procedure Laterality Date  . ABDOMINAL AORTIC ANEURYSM REPAIR    . ABOVE KNEE LEG AMPUTATION Left   . BOWEL RESECTION     for dead bowel, s/p open abdomen  . COLOSTOMY    . GASTROSTOMY    . left leg amputation    . PERICARDIAL FLUID DRAINAGE     Placed for tamponade and removed during the [facility] hospitalization    Past Medical History:  Diagnosis Date  . A-fib [facility])    During hospital stay  . CKD (chronic kidney disease)   . Hyperlipidemia   . Hyperthyroidism   . PVD (peripheral vascular disease) (HCC)     Medications:  I have reviewed the patient's current medications.  (Not in a hospital  admission)   ALLERGIES:  No Known Allergies  FAM HX: No family history on file.  Social History:   reports that he quit smoking about 4 months ago. He does not have any smokeless tobacco history on file. He reports current alcohol use. No history on file for drug use.  ROS: Complete ROS negative if not mentioned in HPI.  Blood pressure 112/66, pulse (!) 103, temperature (!) 97.5 F (36.4 C), temperature source Oral, resp. rate 13, height '5\' 11"'$  (1.803 m), weight 68 kg, SpO2 100 %. PHYSICAL EXAM:   General: NAD, ill appearing and cachetic HE:  EOMI, Conjunctivae normal ENT: No congestion, no rhinorrhea, no exudate or erythema, dry oral mucosa   Cardiovascular: Normal rate, regular rhythm.  No murmurs, rubs, or gallops Pulmonary : Effort normal, breath sounds normal. No wheezes, rales, or rhonchi Abdominal: positive bowel sounds , ostomy bag, pink stoma     Musculoskeletal: no swelling , deformity, injury ,or tenderness in extremities, Skin: Dry flakey , see pictures in chart of multiple wounds and necrosis of right toe    Psychiatric/Behavioral:  normal mood, normal behavior   Results for orders placed or performed during the hospital encounter of 01/31/20 (from the past 48 hour(s))  CBC with Differential/Platelet     Status: Abnormal   Collection Time: 01/31/20  2:36 AM  Result Value Ref Range  WBC 8.8 4.0 - 10.5 K/uL   RBC 3.75 (L) 4.22 - 5.81 MIL/uL   Hemoglobin 10.2 (L) 13.0 - 17.0 g/dL   HCT 33.4 (L) 39.0 - 52.0 %   MCV 89.1 80.0 - 100.0 fL   MCH 27.2 26.0 - 34.0 pg   MCHC 30.5 30.0 - 36.0 g/dL   RDW 15.9 (H) 11.5 - 15.5 %   Platelets 238 150 - 400 K/uL   nRBC 0.0 0.0 - 0.2 %   Neutrophils Relative % 76 %   Neutro Abs 6.7 1.7 - 7.7 K/uL   Lymphocytes Relative 12 %   Lymphs Abs 1.1 0.7 - 4.0 K/uL   Monocytes Relative 6 %   Monocytes Absolute 0.5 0.1 - 1.0 K/uL   Eosinophils Relative 4 %   Eosinophils Absolute 0.4 0.0 - 0.5 K/uL   Basophils Relative 1 %    Basophils Absolute 0.1 0.0 - 0.1 K/uL   Immature Granulocytes 1 %   Abs Immature Granulocytes 0.11 (H) 0.00 - 0.07 K/uL    Comment: Performed at [facility], [place] [address] [place], [place] [id number]  Basic metabolic panel     Status: Abnormal   Collection Time: 01/31/20  2:36 AM  Result Value Ref Range   Sodium 127 (L) 135 - 145 mmol/L   Potassium 6.8 (HH) 3.5 - 5.1 mmol/L    Comment: NO VISIBLE HEMOLYSIS CRITICAL RESULT CALLED TO, READ BACK BY AND VERIFIED WITH: JEREMY LYNCH @ 0327 ON 01/31/20 C VARNER    Chloride 93 (L) 98 - 111 mmol/L   CO2 18 (L) 22 - 32 mmol/L   Glucose, Bld 76 70 - 99 mg/dL    Comment: Glucose reference range applies only to samples taken after fasting for at least 8 hours.   BUN 108 (H) 8 - 23 mg/dL    Comment: RESULTS CONFIRMED BY MANUAL DILUTION   Creatinine, Ser 10.99 (H) 0.61 - 1.24 mg/dL   Calcium 10.5 (H) 8.9 - 10.3 mg/dL   GFR, Estimated 5 (L) >60 mL/min    Comment: (NOTE) Calculated using the CKD-EPI Creatinine Equation (2021)    Anion gap 16 (H) 5 - 15    Comment: Performed at [facility], [place] [address], [place], [place] [address]  CK     Status: Abnormal   Collection Time: 01/31/20  2:36 AM  Result Value Ref Range   Total CK 38 (L) 49 - 397 U/L    Comment: Performed at [facility], [place] [address] [place], [place] [address]  SARS Coronavirus 2 by RT PCR (hospital order, performed in [facility] hospital lab) Nasopharyngeal Nasopharyngeal Swab     Status: None   Collection Time: 01/31/20  3:45 AM   Specimen: Nasopharyngeal Swab  Result Value Ref Range   SARS Coronavirus 2 NEGATIVE NEGATIVE    Comment: (NOTE) SARS-CoV-2 target nucleic acids are NOT DETECTED.  The SARS-CoV-2 RNA is generally detectable in upper and lower respiratory specimens during the acute phase of infection. The lowest concentration of SARS-CoV-2 viral copies this assay can detect is 250 copies / mL. A negative  result does not preclude SARS-CoV-2 infection and should not be used as the sole basis for treatment or other patient management decisions.  A negative result may occur with improper specimen collection / handling, submission of specimen other than nasopharyngeal swab, presence of viral mutation(s) within the areas targeted by this assay, and inadequate number of viral copies (<250 copies / mL). A  negative result must be combined with clinical observations, patient history, and epidemiological information.  Fact Sheet for Patients:   StrictlyIdeas.no  Fact Sheet for Healthcare Providers: BankingDealers.co.za  This test is not yet approved or  cleared by the [place] FDA and has been authorized for detection and/or diagnosis of SARS-CoV-2 by FDA under an Emergency Use Authorization (EUA).  This EUA will remain in effect (meaning this test can be used) for the duration of the COVID-19 declaration under Section 564(b)(1) of the Act, 21 U.S.C. section 360bbb-3(b)(1), unless the authorization is terminated or revoked sooner.  Performed at [facility], [place] [address], [place], [place] [address]   Urinalysis, Routine w reflex microscopic Urine, Catheterized     Status: Abnormal   Collection Time: 01/31/20  5:39 AM  Result Value Ref Range   Color, Urine YELLOW YELLOW   APPearance CLOUDY (A) CLEAR   Specific Gravity, Urine 1.012 1.005 - 1.030   pH 7.0 5.0 - 8.0   Glucose, UA NEGATIVE NEGATIVE mg/dL   Hgb urine dipstick LARGE (A) NEGATIVE   Bilirubin Urine NEGATIVE NEGATIVE   Ketones, ur NEGATIVE NEGATIVE mg/dL   Protein, ur 100 (A) NEGATIVE mg/dL   Nitrite NEGATIVE NEGATIVE   Leukocytes,Ua LARGE (A) NEGATIVE   RBC / HPF >50 (H) 0 - 5 RBC/hpf   WBC, UA >50 (H) 0 - 5 WBC/hpf   Bacteria, UA MANY (A) NONE SEEN   WBC Clumps PRESENT    Hyaline Casts, UA PRESENT     Comment: Performed at [facility], [place] [address] [place], [place] [id number]  Basic metabolic panel     Status: Abnormal   Collection Time: 01/31/20  9:23 AM  Result Value Ref Range   Sodium 129 (L) 135 - 145 mmol/L   Potassium 6.1 (H) 3.5 - 5.1 mmol/L   Chloride 93 (L) 98 - 111 mmol/L   CO2 20 (L) 22 - 32 mmol/L   Glucose, Bld 59 (L) 70 - 99 mg/dL    Comment: Glucose reference range applies only to samples taken after fasting for at least 8 hours.   BUN 123 (H) 8 - 23 mg/dL   Creatinine, Ser 10.34 (H) 0.61 - 1.24 mg/dL   Calcium 10.8 (H) 8.9 - 10.3 mg/dL   GFR, Estimated 5 (L) >60 mL/min    Comment: (NOTE) Calculated using the CKD-EPI Creatinine Equation (2021)    Anion gap 16 (H) 5 - 15    Comment: Performed at [place] [address], [place],  [address]    US RENAL  Result Date: 01/31/2020 CLINICAL DATA:  Initial evaluation for a KI, evaluate for obstructive uropathy/urinary retention. EXAM: RENAL / URINARY TRACT ULTRASOUND COMPLETE COMPARISON:  None available. FINDINGS: Right Kidney: Renal measurements: 10.6 x 4.6 x 5.2 cm = volume: 134 mL. Mildly increased echogenicity within the renal parenchyma. No nephrolithiasis or hydronephrosis. 3.2 cm simple cyst present at the upper pole. Left Kidney: Renal measurements: 11.5 x 4.5 x 4.4 cm = volume: 117 mL. Increased echogenicity within the renal parenchyma. No nephrolithiasis or hydronephrosis. 2.3 cm benign appearing cyst present at the upper pole. Additional 2.6 cm simple cyst present at the lower pole. Bladder: Diffuse irregularity of the bladder wall is seen. While this could reflect trabeculation related incomplete distension, a possible focal bladder mass is difficult to exclude (image 56). Prevoid volume measures 181 cc. Bilateral ureteral jets are visualized. Other: None. IMPRESSION: 1. Increased echogenicity within the renal parenchyma, consistent  with medical renal disease. 2. No hydronephrosis. 3. Multifocal irregularity of the bladder  wall. While this finding may reflect trabeculation related to incomplete distension, a possible focal bladder mass is difficult to exclude based on this appearance. Further assessment with dedicated cross-sectional imaging/CT urogram suggested for further evaluation. 4. Few scattered benign appearing renal cysts as above. Electronically Signed   By: Jeannine Boga M.D.   On: 01/31/2020 06:05   DG Foot Complete Right  Result Date: 01/31/2020 CLINICAL DATA:  Necrosis EXAM: RIGHT FOOT COMPLETE - 3+ VIEW COMPARISON:  None. FINDINGS: Hallux valgus. Degenerative change in the first MTP joint. Prior bunionectomy. Negative for fracture. No acute skeletal abnormality. Mild calcaneal spurring at the Achilles insertion. IMPRESSION: No acute abnormality. Electronically Signed   By: Franchot Gallo M.D.   On: 01/31/2020 02:47    Assessment/Plan  AKI on CKD stage 4: -nausea vomiting and decreased intake. Suggest prerenal injury on CKD recommend giving IV fluids and trending renal function for improvement. CK nl - No sign of obstruction on renal ultrasound, follow up imaging recommended for a possible bladder mass vs nl trabeculation.  -  new foley has  Been placed, 200 cc out on exam. - Avoid nephrotoxins including NSAIDs. Maintain strict Input and Output monitoring including daily standing weights if feasible. Will continue to monitor clinically with labs and daily exams and intervene as indicated. - No emergent HD at this time.   CAUTI - old catheter removed 1/20 - Agree with treatment of UTI given symptoms and UA.  - Cefepime being renally dosed by pharmacy  Hyponatremia Mild , chronic.  Suspect hypovolemic hyponatremia.  - If sodium decreases with initial fluids, hold off and reevaluate    Hyperkalemia - EKG reviewed , stable and has been given temporizing measures. One dose of Lokelma - recommend Lokelma  '10mg'$  TID  - Trend BMP   Anemia - Expect in setting of chronic illness - Hgb 10.2,  normocytic, 11/6 - iron low, TIBC low, Ferritin very high.  - Trend    See attending attestation for official recommendations  Lorene Dy  IM PGY2 01/31/2020, 11:07 AM

## 2020-02-01 ENCOUNTER — Encounter (HOSPITAL_COMMUNITY): Payer: Self-pay | Admitting: Internal Medicine

## 2020-02-01 LAB — COMPREHENSIVE METABOLIC PANEL
ALT: 12 U/L (ref 0–44)
AST: 12 U/L — ABNORMAL LOW (ref 15–41)
Albumin: 2.5 g/dL — ABNORMAL LOW (ref 3.5–5.0)
Alkaline Phosphatase: 75 U/L (ref 38–126)
Anion gap: 14 (ref 5–15)
BUN: 105 mg/dL — ABNORMAL HIGH (ref 8–23)
CO2: 18 mmol/L — ABNORMAL LOW (ref 22–32)
Calcium: 9.3 mg/dL (ref 8.9–10.3)
Chloride: 102 mmol/L (ref 98–111)
Creatinine, Ser: 8.95 mg/dL — ABNORMAL HIGH (ref 0.61–1.24)
GFR, Estimated: 6 mL/min — ABNORMAL LOW (ref >60)
Glucose, Bld: 69 mg/dL — ABNORMAL LOW (ref 70–99)
Potassium: 5.1 mmol/L (ref 3.5–5.1)
Sodium: 134 mmol/L — ABNORMAL LOW (ref 135–145)
Total Bilirubin: 0.9 mg/dL (ref 0.3–1.2)
Total Protein: 6.6 g/dL (ref 6.5–8.1)

## 2020-02-01 LAB — CBC
HCT: 27.2 % — ABNORMAL LOW (ref 39.0–52.0)
Hemoglobin: 8.4 g/dL — ABNORMAL LOW (ref 13.0–17.0)
MCH: 27.4 pg (ref 26.0–34.0)
MCHC: 30.9 g/dL (ref 30.0–36.0)
MCV: 88.6 fL (ref 80.0–100.0)
Platelets: 218 10*3/uL (ref 150–400)
RBC: 3.07 MIL/uL — ABNORMAL LOW (ref 4.22–5.81)
RDW: 15.9 % — ABNORMAL HIGH (ref 11.5–15.5)
WBC: 7.4 10*3/uL (ref 4.0–10.5)
nRBC: 0 % (ref 0.0–0.2)

## 2020-02-01 LAB — GLUCOSE, CAPILLARY: Glucose-Capillary: 78 mg/dL (ref 70–99)

## 2020-02-01 LAB — URINE CULTURE

## 2020-02-01 MED ORDER — VITAMIN C 250 MG PO TABS
250.0000 mg | ORAL_TABLET | Freq: Two times a day (BID) | ORAL | Status: DC
  Administered 2020-02-01 – 2020-02-06 (×9): 250 mg via ORAL
  Filled 2020-02-01 (×12): qty 1

## 2020-02-01 MED ORDER — DIPHENOXYLATE-ATROPINE 2.5-0.025 MG PO TABS
1.0000 | ORAL_TABLET | Freq: Four times a day (QID) | ORAL | Status: DC
  Administered 2020-02-01 – 2020-02-06 (×22): 1 via ORAL
  Filled 2020-02-01 (×22): qty 1

## 2020-02-01 MED ORDER — HEPARIN SODIUM (PORCINE) 5000 UNIT/ML IJ SOLN
5000.0000 [IU] | Freq: Three times a day (TID) | INTRAMUSCULAR | Status: DC
  Administered 2020-02-01 – 2020-02-06 (×16): 5000 [IU] via SUBCUTANEOUS
  Filled 2020-02-01 (×15): qty 1

## 2020-02-01 MED ORDER — ADULT MULTIVITAMIN W/MINERALS CH
1.0000 | ORAL_TABLET | Freq: Every day | ORAL | Status: DC
  Administered 2020-02-02 – 2020-02-06 (×5): 1 via ORAL
  Filled 2020-02-01 (×5): qty 1

## 2020-02-01 MED ORDER — SODIUM BICARBONATE 650 MG PO TABS: 650.0000 mg | ORAL_TABLET | Freq: Every day | ORAL | Status: DC

## 2020-02-01 MED ORDER — NEPRO/CARBSTEADY PO LIQD
237.0000 mL | Freq: Three times a day (TID) | ORAL | Status: DC
  Administered 2020-02-02 – 2020-02-03 (×4): 237 mL via ORAL
  Filled 2020-02-01: qty 237

## 2020-02-01 MED ORDER — DIPHENOXYLATE-ATROPINE 2.5-0.025 MG/5ML PO LIQD: 5.0000 mL | Freq: Four times a day (QID) | ORAL | Status: DC

## 2020-02-01 MED ORDER — SODIUM BICARBONATE 650 MG PO TABS
650.0000 mg | ORAL_TABLET | Freq: Two times a day (BID) | ORAL | Status: DC
  Administered 2020-02-01 – 2020-02-02 (×2): 650 mg via ORAL
  Filled 2020-02-01 (×2): qty 1

## 2020-02-01 NOTE — Progress Notes (Addendum)
PROGRESS NOTE    Curtis Solis  [id number] DOB: Jul 06, 1955 DOA: 01/31/2020 PCP: [name], MD  Brief Narrative: 65 year old male with history of hypertension, PAD, tobacco abuse who was admitted to [facility] in mid September 2021 for repair of abdominal aortic aneurysm 9/17 this led to numerous complications including, embolization of aortic thrombus to the left lower extremity eventually requiring left above-knee amputation. -Also complicated by acute kidney injury, rhabdomyolysis requiring CRRT from 9/18 started intermittent hemodialysis on 9/23. -10/12 had cardiac arrest due to tamponade requiring pericardial drain placement -then developed ischemic bowel, emergent OR 10/12 underwent small bowel resection with open abdomen, went back to the OR on 10/17 had ileostomy placed. -On 10/19 had a Foley placed developed necrosis of foreskin with slough, required blunt dissection to place Foley catheter -On 10/24 was extubated -11/1 had tunneled HD catheter placement -Then had gradual improvement in his kidney function and was transitioned off hemodialysis, creatinine was in the 3 range at discharge Eventually discharged to [place] on 11/15 with a Foley catheter. -In December went from [place] to [place] by urology in follow-up 1/20 and had Foley catheter removed after he passed a voiding trial and was discharged back to [facility], patient was unable to void 6 hours later and was sent to our ED 1/21. -In the emergency room he was noted to have BUN of 108 with a creatinine of 10.9, potassium of 6.8, renal ultrasound did not show hydronephrosis, did have a Foley catheter placed with 200 cc urine drained, also noted to have necrotic changes in his right foot/toes.  Assessment & Plan:   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 Hyperkalemia -Baseline creatinine around 3 at the time of discharge 2 months prior -Creatinine 10.9 on admission with K of 6.8 -Clinically suspected to be  prerenal from high output ileostomy, ultrasound negative for hydronephrosis, Foley catheter placed in the ED 1/21 -Improving with [facility] and aggressive hydration -Also started Lomotil, monitor ileostomy output closely -Good urine output, monitor strict I/Os -Appreciate nephrology input -Labs in a.m.  Necrotic toes with wounds, serosanguineous discharge -Has palpable dorsalis pedis and posterior tibial pulses, appreciate vascular input, recommended to keep the wounds dry and toes separated, ultimately this will demarcate and likely require transmetatarsal amputation -Recommended vascular follow-up in [facility] -Continue wound care  Small bowel ischemia, s/p small bowel resection Terminal ileostomy -Concern for high output and poor absorption -Continue Lomotil 4 times daily, add Imodium as needed  Abdominal wall wound Penis wound -Underwent small bowel resection for ischemia, R iIeostomy -Wound care consult  History of A. Fib -Noted during recent hospitalization -In sinus rhythm now, not on anticoagulation at discharge (chart summary reviewed) I wonder if this was secondary to tamponade or some bleeding complications -Follow-up with cardiology at [place] associated UTI -Catheter was just removed prior to this admission, UA is abnormal, continue cefepime day 2/3 -Urine culture with multiple species, discontinue antibiotics tomorrow  History of cardiac arrest, pericardial tamponade -Requiring pericardial window and drain  DVT prophylaxis: Add heparin subcutaneous Code Status: DNR Family Communication: Updated patient's son [name] 1/21 Disposition Plan:  Status is: Inpatient  Remains inpatient appropriate because:Inpatient level of care appropriate due to severity of illness   Dispo: The patient is from: SNF              Anticipated d/c is to: SNF              Anticipated d/c date is: > 3 days  Patient currently is not medically stable to  d/c.   Difficult to place patient No   Consultants:   Nephrology  Vascular surgery   Procedures:   Antimicrobials:    Subjective: -Feels better today, overall improving  Objective: Vitals:   02/01/20 0000 02/01/20 0436 02/01/20 0900 02/01/20 1205  BP: 130/77 120/70  125/74  Pulse: 94 90  86  Resp: '10 11  15  '$ Temp: 98.6 F (37 C) 98.3 F (36.8 C) 98.3 F (36.8 C) 98 F (36.7 C)  TempSrc: Oral Oral Oral Oral  SpO2: 100% 100%  100%  Weight: 54 kg     Height:        Intake/Output Summary (Last 24 hours) at 02/01/2020 1349 Last data filed at 02/01/2020 0940 Gross per 24 hour  Intake 1479.27 ml  Output 1260 ml  Net 219.27 ml   Filed Weights   01/31/20 0635 01/31/20 1700 02/01/20 0000  Weight: 68 kg 51.7 kg 54 kg    Examination:  General exam: Frail pleasant male appears much older than stated age, awake alert oriented to self and place, partly to time CVS: S1-S2, regular rate rhythm Lungs: Decreased breath sounds at the bases otherwise clear Abdomen: Soft, ileostomy with liquid stool noted, large abdominal scar healed with secondary intention, bowel sounds present Extremities: No edema Skin: No rashes on exposed skin Psychiatry: Mood & affect appropriate.   Data Reviewed:   CBC: Recent Labs  Lab 01/31/20 0236 02/01/20 0440  WBC 8.8 7.4  NEUTROABS 6.7  --   HGB 10.2* 8.4*  HCT 33.4* 27.2*  MCV 89.1 88.6  PLT 238 [id number]   Basic Metabolic Panel: Recent Labs  Lab 01/31/20 0236 01/31/20 0923 01/31/20 1451 02/01/20 0440  NA 127* 129* 129* 134*  K 6.8* 6.1* 6.5* 5.1  CL 93* 93* 95* 102  CO2 18* 20* 20* 18*  GLUCOSE 76 59* 60* 69*  BUN 108* 123* 119* 105*  CREATININE 10.99* 10.34* 10.34* 8.95*  CALCIUM 10.5* 10.8* 10.1 9.3  PHOS  --   --  5.6*  --    GFR: Estimated Creatinine Clearance: 6.5 mL/min (A) (by C-G formula based on SCr of 8.95 mg/dL (H)). Liver Function Tests: Recent Labs  Lab 01/31/20 1451 02/01/20 0440  AST  --  12*  ALT  --   12  ALKPHOS  --  75  BILITOT  --  0.9  PROT  --  6.6  ALBUMIN 2.9* 2.5*   No results for input(s): LIPASE, AMYLASE in the last 168 hours. No results for input(s): AMMONIA in the last 168 hours. Coagulation Profile: No results for input(s): INR, PROTIME in the last 168 hours. Cardiac Enzymes: Recent Labs  Lab 01/31/20 0236  CKTOTAL 38*   BNP (last 3 results) No results for input(s): PROBNP in the last 8760 hours. HbA1C: No results for input(s): HGBA1C in the last 72 hours. CBG: Recent Labs  Lab 01/31/20 1113  GLUCAP 67*   Lipid Profile: No results for input(s): CHOL, HDL, LDLCALC, TRIG, CHOLHDL, LDLDIRECT in the last 72 hours. Thyroid Function Tests: No results for input(s): TSH, T4TOTAL, FREET4, T3FREE, THYROIDAB in the last 72 hours. Anemia Panel: No results for input(s): VITAMINB12, FOLATE, FERRITIN, TIBC, IRON, RETICCTPCT in the last 72 hours. Urine analysis:    Component Value Date/Time   COLORURINE YELLOW 01/31/2020 0539   APPEARANCEUR CLOUDY (A) 01/31/2020 0539   LABSPEC 1.012 01/31/2020 0539   PHURINE 7.0 01/31/2020 0539   GLUCOSEU NEGATIVE 01/31/2020 0539   HGBUR  LARGE (A) 01/31/2020 0539   BILIRUBINUR NEGATIVE 01/31/2020 [place] 01/31/2020 0539   PROTEINUR 100 (A) 01/31/2020 0539   NITRITE NEGATIVE 01/31/2020 0539   LEUKOCYTESUR LARGE (A) 01/31/2020 0539   Sepsis Labs: '@LABRCNTIP'$ (procalcitonin:4,lacticidven:4)  ) Recent Results (from the past 240 hour(s))  SARS Coronavirus 2 by RT PCR (hospital order, performed in [facility] hospital lab) Nasopharyngeal Nasopharyngeal Swab     Status: None   Collection Time: 01/31/20  3:45 AM   Specimen: Nasopharyngeal Swab  Result Value Ref Range Status   SARS Coronavirus 2 NEGATIVE NEGATIVE Final    Comment: (NOTE) SARS-CoV-2 target nucleic acids are NOT DETECTED.  The SARS-CoV-2 RNA is generally detectable in upper and lower respiratory specimens during the acute phase of infection. The  lowest concentration of SARS-CoV-2 viral copies this assay can detect is 250 copies / mL. A negative result does not preclude SARS-CoV-2 infection and should not be used as the sole basis for treatment or other patient management decisions.  A negative result may occur with improper specimen collection / handling, submission of specimen other than nasopharyngeal swab, presence of viral mutation(s) within the areas targeted by this assay, and inadequate number of viral copies (<250 copies / mL). A negative result must be combined with clinical observations, patient history, and epidemiological information.  Fact Sheet for Patients:   StrictlyIdeas.no  Fact Sheet for Healthcare Providers: BankingDealers.co.za  This test is not yet approved or  cleared by the [place] FDA and has been authorized for detection and/or diagnosis of SARS-CoV-2 by FDA under an Emergency Use Authorization (EUA).  This EUA will remain in effect (meaning this test can be used) for the duration of the COVID-19 declaration under Section 564(b)(1) of the Act, 21 U.S.C. section 360bbb-3(b)(1), unless the authorization is terminated or revoked sooner.  Performed at [facility], [place] [address], [place], [place] [address]   Urine culture     Status: Abnormal   Collection Time: 01/31/20  6:33 AM   Specimen: Urine, Random  Result Value Ref Range Status   Specimen Description URINE, RANDOM  Final   Special Requests   Final    NONE Performed at [facility], [place] [address] [place], [place] [address]    Culture MULTIPLE SPECIES PRESENT, SUGGEST RECOLLECTION (A)  Final   Report Status 02/01/2020 FINAL  Final    Radiology Studies: US RENAL  Result Date: 01/31/2020 CLINICAL DATA:  Initial evaluation for a KI, evaluate for obstructive uropathy/urinary retention. EXAM: RENAL / URINARY TRACT ULTRASOUND COMPLETE COMPARISON:  None available.  FINDINGS: Right Kidney: Renal measurements: 10.6 x 4.6 x 5.2 cm = volume: 134 mL. Mildly increased echogenicity within the renal parenchyma. No nephrolithiasis or hydronephrosis. 3.2 cm simple cyst present at the upper pole. Left Kidney: Renal measurements: 11.5 x 4.5 x 4.4 cm = volume: 117 mL. Increased echogenicity within the renal parenchyma. No nephrolithiasis or hydronephrosis. 2.3 cm benign appearing cyst present at the upper pole. Additional 2.6 cm simple cyst present at the lower pole. Bladder: Diffuse irregularity of the bladder wall is seen. While this could reflect trabeculation related incomplete distension, a possible focal bladder mass is difficult to exclude (image 56). Prevoid volume measures 181 cc. Bilateral ureteral jets are visualized. Other: None. IMPRESSION: 1. Increased echogenicity within the renal parenchyma, consistent with medical renal disease. 2. No hydronephrosis. 3. Multifocal irregularity of the bladder wall. While this finding may reflect trabeculation related to incomplete distension, a possible focal bladder  mass is difficult to exclude based on this appearance. Further assessment with dedicated cross-sectional imaging/CT urogram suggested for further evaluation. 4. Few scattered benign appearing renal cysts as above. Electronically Signed   By: Jeannine Boga M.D.   On: 01/31/2020 06:05   DG Foot Complete Right  Result Date: 01/31/2020 CLINICAL DATA:  Necrosis EXAM: RIGHT FOOT COMPLETE - 3+ VIEW COMPARISON:  None. FINDINGS: Hallux valgus. Degenerative change in the first MTP joint. Prior bunionectomy. Negative for fracture. No acute skeletal abnormality. Mild calcaneal spurring at the Achilles insertion. IMPRESSION: No acute abnormality. Electronically Signed   By: Franchot Gallo M.D.   On: 01/31/2020 02:47        Scheduled Meds: . ceFEPime (MAXIPIME) IV  500 mg Intravenous Q24H  . Chlorhexidine Gluconate Cloth  6 each Topical Daily  . diphenoxylate-atropine  1  tablet Oral QID  . feeding supplement (NEPRO CARB STEADY)  237 mL Oral TID BM  . liver oil-zinc oxide   Topical BID  . [START ON 02/02/2020] multivitamin with minerals  1 tablet Oral Daily  . sodium bicarbonate  650 mg Oral BID  . vitamin C  250 mg Oral BID   Continuous Infusions: . sodium chloride 150 mL/hr at 02/01/20 0727     LOS: 1 day    Time spent: 29mn  PDomenic Polite MD [facility]  02/01/2020, 1:49 PM

## 2020-02-01 NOTE — Progress Notes (Addendum)
Initial Nutrition Assessment  DOCUMENTATION CODES:   Underweight  INTERVENTION:   Recommend supplemental bolus tube feeds in between meals to help pt meet his estimated needs:  Recommend Osmolite 1.5- 4 cans daily- Flush with 26m of water before and after each feed.   Recommend ProSource TF 43mTID via tube   Regimen provides 1540kcal/day, 93g/day protein and 112438may free water   Recommend Juven Fruit Punch BID via tube, each serving provides 95kcal and 2.5g of protein (amino acids glutamine and arginine)  Nepro Shake po TID, each supplement provides 425 kcal and 19 grams protein  MVI daily   Vitamin C '250mg'$  po BID   NUTRITION DIAGNOSIS:   Increased nutrient needs related to wound healing as evidenced by estimated needs.  GOAL:   Patient will meet greater than or equal to 90% of their needs  MONITOR:   PO intake,Supplement acceptance,Labs,Weight trends,Skin,I & O's  REASON FOR ASSESSMENT:   Malnutrition Screening Tool    ASSESSMENT:   64 88o. 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  Unable to reach pt by phone. [facility] with poor appetite and oral intake for the past several weeks. Pt with G-tube in place but RN reports that pt has not ever received any tube feeds at their facility (at least 1-2 months). Pt gets 200m73m free water flushes BID and  he is being offered Resource supplements. Per chart, pt has lost 80lbs(41%) since September; pt's UBW is ~199lbs. This is severe weight loss. Pt also with numerous wounds. Spoke with RN, pt ate ~50% of his breakfast this morning; pt documented to have eaten 100% of his dinner last night. RD highly suspects pt with severe malnutrition but unable to diagnose at this time as NFPE cannot be performed. Would recommend initiation of bolus tube feeds to supplement pt in between meals. Pt is not eating enough to meet his estimated needs and to support wound healing. RD will add supplements to help pt meet his estimated needs.  Medications reviewed and include: cefepime, Na bicarbonate, Na 150ml67m zofran   Labs reviewed: Na 134(L), BUN 105(H), creat 8.95(H) Hgb 8.4(L), Hct 27.2(L)  NUTRITION - FOCUSED PHYSICAL EXAM: Unable to perform at this time   Diet Order:   Diet Order            Diet renal with fluid restriction Fluid restriction: 1200 mL Fluid; Room service appropriate? Yes; Fluid consistency: Thin  Diet effective now                EDUCATION NEEDS:   Education needs have been addressed  Skin:  Skin Assessment: Reviewed RN Assessment  #1 Right ischium MASD/Friction/Sheer Pink, moist measures 1 cm x 1.2 cm  #2 Left ischium MASD/Friction/Sheer pink, moist 1 cm x 2.6 cm  #3 Right Heel Stage 3 related to pressure and ischemia, dry scaly skin, bleeding measures 3 cm x 4 cm. #4  Abdominal surgical wound has had skin grafts, brown, with scattered pink friable areas and brownish/yellow tissue.  #5 Right thigh skin graft donor site, pink moist #6 Right foot and toes, thickened scaly skin with necrotic toes,open wounds pink red with serosanguinous drainage, likely related to inadequate arterial blood flow to the foot. Vascular consult recommended.  #7 Healing wound on the tip of the urethral meatus of the penis  Last BM:  1/22- 154m via ostomy  Height:   Ht Readings from Last 1 Encounters:   01/31/20 '5\' 11"'$  (1.803 m)    Weight:   Wt Readings from Last 1 Encounters:  02/01/20 54 kg    Ideal Body Weight:  68.8 kg (adjusted for AKA)  BMI:  Body mass index is 16.6 kg/m.  Estimated Nutritional Needs:   Kcal:  1800-2100kcal/day  Protein:  90-105g/day  Fluid:  1.6-1.9L/day  CKoleen DistanceMS, RD, LDN Please [facility] and/or RD on-call/weekend/after hours pager

## 2020-02-01 NOTE — Progress Notes (Signed)
[facility] Progress Note   Assessment/ Plan:   1.  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  Renal function responding to IV fluids      - Notable labs sCr 10.3 >8.9 , BUN 119 >105, sodium 129 >134 , K 5.1, CO2 18      - Recommend continued hydration with IV fluids and trending renal function      - Avoid nephrotoxins including NSAIDs and iodinated intravenous contrast exposure unless the latter is absolutely indicated.  Prefer narcotic agents for pain control or hydromorphone, fentanyl, and methadone.  Morphine should not be used.  Avoid baclofen and avoid oral sodium phosphate and magnesium citrate based laxative/bowel preps.  Maintain strict Input and Output monitoring including daily standing weights if feasible. Will continue to monitor clinically with labs and daily exams and intervene as indicated. 2. Catheter related UTI - Symptomatically improving -On cefepime 3. Anemia of chronic illness - Hemoglobin 10.2, down to 8.4 today with IV fluids.  No signs of acute blood loss 4. Metabolic acidosis      - CO2 18 , now with PO intake     - restarted home dose of sodium bicarbonate   5. Nutrition:renal diet  6. Hyprtension: blood pressure improved with IV fluids , normotensive   Subjective:   Curtis Solis is a 65 y.o. with PMH of HTN, PAD, tobacco use disorder admited for low urine output after removal of Foley catheter and worsening kidney function on hospital day 1   Patient looks improved with IV fluids and nutrition today.  His major concern is his foot pain this morning.  He notes the bed is too short and he has ongoing foot pain after taking tylenol. He will talk to his primary team about pain on rounds and discuss different bed arrangements if possible. Denies any nausea and dysuria has improved.  We discussed improving renal function.  All questions and concerns addressed.   Objective:   BP 120/70 (BP  Location: Left Arm)   Pulse 90   Temp 98.3 F (36.8 C) (Oral)   Resp 11   Ht '5\' 11"'$  (1.803 m)   Wt 54 kg   SpO2 100%   BMI 16.60 kg/m   Physical Exam: Gen:NAD, pleasant , thin man, cachectic  CVS: Irregular regular pulse, no murmurs Resp: Clear to auscultation bilaterally, no wheezes rales Abd: Positive bowel sounds, ostomy right quadrant  Labs: BMET Recent Labs  Lab 01/31/20 0236 01/31/20 0923 01/31/20 1451 02/01/20 0440  NA 127* 129* 129* 134*  K 6.8* 6.1* 6.5* 5.1  CL 93* 93* 95* 102  CO2 18* 20* 20* 18*  GLUCOSE 76 59* 60* 69*  BUN 108* 123* 119* 105*  CREATININE 10.99* 10.34* 10.34* 8.95*  CALCIUM 10.5* 10.8* 10.1 9.3  PHOS  --   --  5.6*  --    CBC Recent Labs  Lab 01/31/20 0236 02/01/20 0440  WBC 8.8 7.4  NEUTROABS 6.7  --   HGB 10.2* 8.4*  HCT 33.4* 27.2*  MCV 89.1 88.6  PLT 238 218      Medications:    . ceFEPime (MAXIPIME) IV  500 mg Intravenous Q24H  . Chlorhexidine Gluconate Cloth  6 each Topical Daily  . diphenoxylate-atropine  1 tablet Oral QID  . liver oil-zinc oxide   Topical BID  . sodium zirconium cyclosilicate  10 g Oral BID     Tamsen Snider,  MD PGY 2, IM 02/01/2020, 7:51 AM

## 2020-02-02 LAB — CBC
HCT: 25.9 % — ABNORMAL LOW (ref 39.0–52.0)
Hemoglobin: 7.9 g/dL — ABNORMAL LOW (ref 13.0–17.0)
MCH: 27.1 pg (ref 26.0–34.0)
MCHC: 30.5 g/dL (ref 30.0–36.0)
MCV: 89 fL (ref 80.0–100.0)
Platelets: 204 10*3/uL (ref 150–400)
RBC: 2.91 MIL/uL — ABNORMAL LOW (ref 4.22–5.81)
RDW: 16 % — ABNORMAL HIGH (ref 11.5–15.5)
WBC: 6.5 10*3/uL (ref 4.0–10.5)
nRBC: 0 % (ref 0.0–0.2)

## 2020-02-02 LAB — RENAL FUNCTION PANEL
Albumin: 2.3 g/dL — ABNORMAL LOW (ref 3.5–5.0)
Anion gap: 10 (ref 5–15)
BUN: 84 mg/dL — ABNORMAL HIGH (ref 8–23)
CO2: 18 mmol/L — ABNORMAL LOW (ref 22–32)
Calcium: 9.1 mg/dL (ref 8.9–10.3)
Chloride: 108 mmol/L (ref 98–111)
Creatinine, Ser: 6.9 mg/dL — ABNORMAL HIGH (ref 0.61–1.24)
GFR, Estimated: 8 mL/min — ABNORMAL LOW (ref >60)
Glucose, Bld: 80 mg/dL (ref 70–99)
Phosphorus: 4.7 mg/dL — ABNORMAL HIGH (ref 2.5–4.6)
Potassium: 4.7 mmol/L (ref 3.5–5.1)
Sodium: 136 mmol/L (ref 135–145)

## 2020-02-02 LAB — RETICULOCYTES
Immature Retic Fract: 18.9 % — ABNORMAL HIGH (ref 2.3–15.9)
RBC.: 2.94 MIL/uL — ABNORMAL LOW (ref 4.22–5.81)
Retic Count, Absolute: 62 10*3/uL (ref 19.0–186.0)
Retic Ct Pct: 2.1 % (ref 0.4–3.1)

## 2020-02-02 LAB — FERRITIN: Ferritin: 1124 ng/mL — ABNORMAL HIGH (ref 24–336)

## 2020-02-02 LAB — IRON AND TIBC
Iron: 78 ug/dL (ref 45–182)
Saturation Ratios: 47 % — ABNORMAL HIGH (ref 17.9–39.5)
TIBC: 165 ug/dL — ABNORMAL LOW (ref 250–450)
UIBC: 87 ug/dL

## 2020-02-02 LAB — FOLATE: Folate: 8.1 ng/mL (ref >5.9)

## 2020-02-02 LAB — VITAMIN B12: Vitamin B-12: 1708 pg/mL — ABNORMAL HIGH (ref 180–914)

## 2020-02-02 MED ORDER — DEXTROSE 5 % IV SOLN
500.0000 mg | INTRAVENOUS | Status: AC
  Administered 2020-02-03: 500 mg via INTRAVENOUS
  Filled 2020-02-02 (×2): qty 0.5

## 2020-02-02 MED ORDER — SODIUM BICARBONATE 650 MG PO TABS
1300.0000 mg | ORAL_TABLET | Freq: Two times a day (BID) | ORAL | Status: DC
Start: 2020-02-02 — End: 2020-02-06
  Administered 2020-02-02 – 2020-02-06 (×8): 1300 mg via ORAL
  Filled 2020-02-02 (×8): qty 2

## 2020-02-02 MED ORDER — METOPROLOL TARTRATE 25 MG PO TABS
25.0000 mg | ORAL_TABLET | Freq: Two times a day (BID) | ORAL | Status: DC
  Administered 2020-02-02 – 2020-02-06 (×8): 25 mg via ORAL
  Filled 2020-02-02 (×9): qty 1

## 2020-02-02 NOTE — Progress Notes (Addendum)
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 -Also complicated by acute kidney injury, rhabdomyolysis requiring CRRT from 9/18 started intermittent hemodialysis on 9/23. -10/12 had cardiac arrest due to tamponade requiring pericardial drain placement -then developed ischemic bowel, emergent OR 10/12 underwent small bowel resection with open abdomen, went back to the OR on 10/17 had ileostomy placed. -On 10/19 had a Foley placed developed necrosis of foreskin with slough, required blunt dissection to place Foley catheter -On 10/24 was extubated -11/1 had tunneled HD catheter placement -Then had gradual improvement in his kidney function and was transitioned off hemodialysis, creatinine was in the 3 range at discharge Eventually discharged to [place] on 11/15 with a Foley catheter. -In December went from [place] to [place] by urology in follow-up 1/20 and had Foley catheter removed after he passed a voiding trial and was discharged back to [facility], patient was unable to void 6 hours later and was sent to our ED 1/21. -In the emergency room he was noted to have BUN of 108 with a creatinine of 10.9, potassium of 6.8, renal ultrasound did not show hydronephrosis, did have a Foley catheter placed with 200 cc urine drained, also noted to have necrotic changes in his right foot/toes.  Assessment & Plan:   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 Hyperkalemia -Baseline creatinine around 3, from 11/21, creatinine 10.9 on admission with K of 6.8 -This is prerenal from high output ileostomy, ultrasound  negative for hydronephrosis, Foley catheter placed in the ED 1/21 -Improving with [facility] and aggressive hydration -Creatinine down to 6.9 today, cut down IV fluids -Also started Lomotil, monitor ileostomy output closely -Appreciate nephrology input -Labs in a.m. -Ambulate, PT/OT  Necrotic toes with wounds, serosanguineous discharge -Has palpable dorsalis pedis and posterior tibial pulses, appreciate vascular input, recommended to keep the wounds dry and toes separated, ultimately this will demarcate and likely require transmetatarsal amputation -Recommended vascular follow-up in [facility] -Continue wound care  Small bowel ischemia, s/p small bowel resection Terminal ileostomy -Concern for high output and poor absorption, monitor ostomy output daily -Continue Lomotil 4 times daily, add Imodium as needed  Abdominal wall wound Penis wound -Underwent small bowel resection for ischemia, R iIeostomy -Wound care consult  History of A. Fib -Noted during recent hospitalization -In sinus rhythm now, not on anticoagulation at discharge (chart summary reviewed) I wonder if this was secondary to tamponade or some bleeding complications -Follow-up with cardiology at [place] associated UTI -Catheter was just removed prior to this admission, UA is abnormal, continue cefepime day 3 -Urine culture with multiple species, discontinue antibiotics today  History of cardiac arrest, pericardial tamponade -Requiring pericardial window and drain  DVT prophylaxis: heparin subcutaneous Code Status: DNR Family Communication: Updated patient's son [name] 1/21 Disposition Plan:  Status is: Inpatient  Remains inpatient appropriate because:Inpatient level of care appropriate due to severity of illness   Dispo: The patient is from: SNF              Anticipated d/c is to: SNF              Anticipated d/c date is: > 3 days  Patient currently is not medically stable to  d/c.   Difficult to place patient No   Consultants:   Nephrology  Vascular surgery   Procedures:   Antimicrobials:    Subjective: -Feels well overall, denies any complaints, oral intake is somewhat poor  Objective: Vitals:   02/01/20 2106 02/02/20 0500 02/02/20 0800 02/02/20 0904  BP:  121/67 140/79 123/82  Pulse:  91 (!) 108 (!) 104  Resp: 14 (!) 9 (!) 22   Temp: 98.1 F (36.7 C) 97.8 F (36.6 C) 98.2 F (36.8 C)   TempSrc: Oral  Oral   SpO2:  100% 98% 100%  Weight:  54.9 kg    Height:        Intake/Output Summary (Last 24 hours) at 02/02/2020 1146 Last data filed at 02/02/2020 0547 Gross per 24 hour  Intake 2661.96 ml  Output 1775 ml  Net 886.96 ml   Filed Weights   01/31/20 1700 02/01/20 0000 02/02/20 0500  Weight: 51.7 kg 54 kg 54.9 kg    Examination:  General exam: Frail pleasant male appears much older than stated age, awake alert oriented to self place and partly to time CVS: S1-S2, regular rate rhythm Lungs: Clear bilaterally Abdomen: Soft, ileostomy with liquid stool, large abdominal scar healed with secondary intention, bowel sounds present Extremities: No edema Skin: No rashes on exposed skin Psychiatry: Mood & affect appropriate.   Data Reviewed:   CBC: Recent Labs  Lab 01/31/20 0236 02/01/20 0440 02/02/20 0336  WBC 8.8 7.4 6.5  NEUTROABS 6.7  --   --   HGB 10.2* 8.4* 7.9*  HCT 33.4* 27.2* 25.9*  MCV 89.1 88.6 89.0  PLT 238 218 0000000   Basic Metabolic Panel: Recent Labs  Lab 01/31/20 0236 01/31/20 0923 01/31/20 1451 02/01/20 0440 02/02/20 0336  NA 127* 129* 129* 134* 136  K 6.8* 6.1* 6.5* 5.1 4.7  CL 93* 93* 95* 102 108  CO2 18* 20* 20* 18* 18*  GLUCOSE 76 59* 60* 69* 80  BUN 108* 123* 119* 105* 84*  CREATININE 10.99* 10.34* 10.34* 8.95* 6.90*  CALCIUM 10.5* 10.8* 10.1 9.3 9.1  PHOS  --   --  5.6*  --  4.7*   GFR: Estimated Creatinine Clearance: 8.6 mL/min (A) (by C-G formula based on SCr of 6.9 mg/dL (H)). Liver  Function Tests: Recent Labs  Lab 01/31/20 1451 02/01/20 0440 02/02/20 0336  AST  --  12*  --   ALT  --  12  --   ALKPHOS  --  75  --   BILITOT  --  0.9  --   PROT  --  6.6  --   ALBUMIN 2.9* 2.5* 2.3*   No results for input(s): LIPASE, AMYLASE in the last 168 hours. No results for input(s): AMMONIA in the last 168 hours. Coagulation Profile: No results for input(s): INR, PROTIME in the last 168 hours. Cardiac Enzymes: Recent Labs  Lab 01/31/20 0236  CKTOTAL 38*   BNP (last 3 results) No results for input(s): PROBNP in the last 8760 hours. HbA1C: No results for input(s): HGBA1C in the last 72 hours. CBG: Recent Labs  Lab 01/31/20 1113 02/01/20 2057  GLUCAP 67* 78   Lipid Profile: No results for input(s): CHOL, HDL, LDLCALC, TRIG, CHOLHDL, LDLDIRECT in the last 72 hours. Thyroid Function Tests: No results for input(s): TSH, T4TOTAL, FREET4, T3FREE, THYROIDAB in the last 72 hours. Anemia Panel: Recent Labs    02/02/20 0336 02/02/20 0809  VITAMINB12  --  1,708*  FOLATE  --  8.1  FERRITIN  --  1,124*  TIBC  --  165*  IRON  --  78  RETICCTPCT 2.1  --    Urine analysis:    Component Value Date/Time   COLORURINE YELLOW 01/31/2020 0539   APPEARANCEUR CLOUDY (A) 01/31/2020 0539   LABSPEC 1.012 01/31/2020 0539   PHURINE 7.0 01/31/2020 0539   GLUCOSEU NEGATIVE 01/31/2020 0539   HGBUR LARGE (A) 01/31/2020 0539   BILIRUBINUR NEGATIVE 01/31/2020 [place] 01/31/2020 0539   PROTEINUR 100 (A) 01/31/2020 0539   NITRITE NEGATIVE 01/31/2020 0539   LEUKOCYTESUR LARGE (A) 01/31/2020 0539   Sepsis Labs: '@LABRCNTIP'$ (procalcitonin:4,lacticidven:4)  ) Recent Results (from the past 240 hour(s))  SARS Coronavirus 2 by RT PCR (hospital order, performed in [place] hospital lab) Nasopharyngeal Nasopharyngeal Swab     Status: None   Collection Time: 01/31/20  3:45 AM   Specimen: Nasopharyngeal Swab  Result Value Ref Range Status   SARS Coronavirus 2  NEGATIVE NEGATIVE Final    Comment: (NOTE) SARS-CoV-2 target nucleic acids are NOT DETECTED.  The SARS-CoV-2 RNA is generally detectable in upper and lower respiratory specimens during the acute phase of infection. The lowest concentration of SARS-CoV-2 viral copies this assay can detect is 250 copies / mL. A negative result does not preclude SARS-CoV-2 infection and should not be used as the sole basis for treatment or other patient management decisions.  A negative result may occur with improper specimen collection / handling, submission of specimen other than nasopharyngeal swab, presence of viral mutation(s) within the areas targeted by this assay, and inadequate number of viral copies (<250 copies / mL). A negative result must be combined with clinical observations, patient history, and epidemiological information.  Fact Sheet for Patients:   StrictlyIdeas.no  Fact Sheet for Healthcare Providers: BankingDealers.co.za  This test is not yet approved or  cleared by the [place] FDA and has been authorized for detection and/or diagnosis of SARS-CoV-2 by FDA under an Emergency Use Authorization (EUA).  This EUA will remain in effect (meaning this test can be used) for the duration of the COVID-19 declaration under Section 564(b)(1) of the Act, 21 U.S.C. section 360bbb-3(b)(1), unless the authorization is terminated or revoked sooner.  Performed at [facility], [place] [address] [place], [place] [address]   Urine culture     Status: Abnormal   Collection Time: 01/31/20  6:33 AM   Specimen: Urine, Random  Result Value Ref Range Status   Specimen Description URINE, RANDOM  Final   Special Requests   Final    NONE Performed at [facility], [place] [address], [place],  [address]    Culture MULTIPLE SPECIES PRESENT, SUGGEST RECOLLECTION (A)  Final   Report Status 02/01/2020 FINAL  Final    Radiology  Studies: No results found.      Scheduled Meds: . ceFEPime (MAXIPIME) IV  500 mg Intravenous Q24H  . Chlorhexidine Gluconate Cloth  6 each Topical Daily  . diphenoxylate-atropine  1 tablet Oral QID  . feeding supplement (NEPRO CARB STEADY)  237 mL Oral TID BM  . heparin injection (subcutaneous)  5,000 Units Subcutaneous Q8H  . liver oil-zinc oxide   Topical BID  . metoprolol tartrate  25 mg Oral BID  . multivitamin with minerals  1 tablet Oral Daily  . sodium bicarbonate  1,300 mg Oral BID  . vitamin C  250 mg Oral BID   Continuous Infusions: . sodium chloride  100 mL/hr at 02/02/20 0850     LOS: 2 days    Time spent: 84mn  PDomenic Polite MD [facility]  02/02/2020, 11:46 AM

## 2020-02-02 NOTE — Progress Notes (Signed)
   VASCULAR SURGERY ASSESSMENT & PLAN:   DRY GANGRENE TOES RIGHT FOOT: The patient has a palpable dorsalis pedis and posterior tibial pulse.  With continued dry dressing changes and allow the toes to demarcate.  I suspect he will ultimately require transmetatarsal amputation.  This can be followed as an outpatient once he is discharged and he can follow-up in [facility] where his surgery was done.  Vascular surgery is following peripherally.  SUBJECTIVE:   No complaints  PHYSICAL EXAM:   Vitals:   02/01/20 1500 02/01/20 1540 02/01/20 2106 02/02/20 0500  BP: 131/69 116/81  121/67  Pulse: 88 (!) 101  91  Resp: 13 (!) 22 14 (!) 9  Temp:  98.3 F (36.8 C) 98.1 F (36.7 C) 97.8 F (36.6 C)  TempSrc:  Oral Oral   SpO2: 100% 100%  100%  Weight:    54.9 kg  Height:       The dressing on the right foot is dry. Palpable posterior tibial pulse and dorsalis pedis pulse  LABS:   Lab Results  Component Value Date   WBC 6.5 02/02/2020   HGB 7.9 (L) 02/02/2020   HCT 25.9 (L) 02/02/2020   MCV 89.0 02/02/2020   PLT 204 02/02/2020   Lab Results  Component Value Date   CREATININE 6.90 (H) 02/02/2020   CBG (last 3)  Recent Labs    01/31/20 1113 02/01/20 2057  GLUCAP 67* 78    PROBLEM LIST:    Principal Problem:   Acute kidney failure (HCC) Active Problems:   Hyperkalemia   Uremia   Necrotic toes (HCC)   Colostomy present (HCC)   Open abdominal wall wound, subsequent encounter   Open wound of penis   Acute lower UTI   Pressure injury of skin   CURRENT MEDS:   . ceFEPime (MAXIPIME) IV  500 mg Intravenous Q24H  . Chlorhexidine Gluconate Cloth  6 each Topical Daily  . diphenoxylate-atropine  1 tablet Oral QID  . feeding supplement (NEPRO CARB STEADY)  237 mL Oral TID BM  . heparin injection (subcutaneous)  5,000 Units Subcutaneous Q8H  . liver oil-zinc oxide   Topical BID  . multivitamin with minerals  1 tablet Oral Daily  . sodium bicarbonate  650 mg Oral BID  .  vitamin C  250 mg Oral BID    Deitra Mayo Office: [phone] 02/02/2020

## 2020-02-02 NOTE — Progress Notes (Signed)
[facility] NEPHROLOGY PROGRESS NOTE  Assessment/ Plan: Pt is a 65 y.o. yo male  with history of hypertension, PAD, smoker, admitted to [facility] in 09/2019 for AAA surgery complicated by embolization of aortic thrombus leading to left above-knee amputation, AKI requiring CRRT, leading to CKD with baseline creatinine level around 3, had cardiac arrest, pericardial effusion requiring pericardial drain placement, ischemic bowel taken to the OR emergently for small bowel resection, bladder obstruction requiring Foley catheter insertion which was removed by urologist on 1/20, living in rehab facility.  The patient was unable to pass urine after removal of Foley catheter therefore he was sent to the ER. In the ER he was found to have potassium level of 6.8, elevated BUN and creatinine level almost 11. Patient was transferred from [facility], ER to [facility] for further evaluation.   #Hyperkalemia: Improved after medical treatment.  On IV fluid.  Monitor lab.    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  Clinically much better.  He is nonoliguric and creatinine level trending down to 6.9 today.  Lower maintenance IV fluid.  UA with UTI on antibiotics  Kidney ultrasound ruled out obstruction however has CKD.  Strict ins and out, daily lab.  No urgent need for dialysis. Watch for renal recovery.    #Hyponatremia due to dehydration: Improved with IV fluid.  Monitor lab.  #Catheter related UTI: Follow-up culture results. Currently on cefepime.    #Metabolic acidosis: Increased oral sodium bicarbonate.   #Hypotension: BP improved with IV fluid.   Subjective: Seen and examined at bedside.  Denies nausea vomiting chest pain shortness of breath.  Urine output 1.4 L.  No new event. Objective Vital signs in last 24 hours: Vitals:   02/01/20 2106 02/02/20 0500  02/02/20 0800 02/02/20 0904  BP:  121/67 140/79 123/82  Pulse:  91 (!) 108 (!) 104  Resp: 14 (!) 9 (!) 22   Temp: 98.1 F (36.7 C) 97.8 F (36.6 C) 98.2 F (36.8 C)   TempSrc: Oral  Oral   SpO2:  100% 98% 100%  Weight:  54.9 kg    Height:       Weight change: 3.175 kg  Intake/Output Summary (Last 24 hours) at 02/02/2020 1116 Last data filed at 02/02/2020 0547 Gross per 24 hour  Intake 2661.96 ml  Output 1775 ml  Net 886.96 ml       Labs: Basic Metabolic Panel: Recent Labs  Lab 01/31/20 1451 02/01/20 0440 02/02/20 0336  NA 129* 134* 136  K 6.5* 5.1 4.7  CL 95* 102 108  CO2 20* 18* 18*  GLUCOSE 60* 69* 80  BUN 119* 105* 84*  CREATININE 10.34* 8.95* 6.90*  CALCIUM 10.1 9.3 9.1  PHOS 5.6*  --  4.7*   Liver Function Tests: Recent Labs  Lab 01/31/20 1451 02/01/20 0440 02/02/20 0336  AST  --  12*  --   ALT  --  12  --   ALKPHOS  --  75  --   BILITOT  --  0.9  --   PROT  --  6.6  --   ALBUMIN 2.9* 2.5* 2.3*   No results for input(s): LIPASE, AMYLASE in the last 168 hours. No results for input(s): AMMONIA in the last 168 hours. CBC: Recent Labs  Lab 01/31/20 0236 02/01/20 0440 02/02/20 0336  WBC 8.8 7.4 6.5  NEUTROABS 6.7  --   --  HGB 10.2* 8.4* 7.9*  HCT 33.4* 27.2* 25.9*  MCV 89.1 88.6 89.0  PLT 238 218 204   Cardiac Enzymes: Recent Labs  Lab 01/31/20 0236  CKTOTAL 38*   CBG: Recent Labs  Lab 01/31/20 1113 02/01/20 2057  GLUCAP 67* 78    Iron Studies:  Recent Labs    02/02/20 0809  IRON 78  TIBC 165*  FERRITIN 1,124*   Studies/Results: No results found.  Medications: Infusions: . sodium chloride 100 mL/hr at 02/02/20 0850    Scheduled Medications: . ceFEPime (MAXIPIME) IV  500 mg Intravenous Q24H  . Chlorhexidine Gluconate Cloth  6 each Topical Daily  . diphenoxylate-atropine  1 tablet Oral QID  . feeding supplement (NEPRO CARB STEADY)  237 mL Oral TID BM  . heparin injection (subcutaneous)  5,000 Units Subcutaneous  Q8H  . liver oil-zinc oxide   Topical BID  . metoprolol tartrate  25 mg Oral BID  . multivitamin with minerals  1 tablet Oral Daily  . sodium bicarbonate  1,300 mg Oral BID  . vitamin C  250 mg Oral BID    have reviewed scheduled and prn medications.  Physical Exam: General:NAD, comfortable Heart:RRR, s1s2 nl Lungs:clear b/l, no crackle Abdomen:soft, Non-tender, ileostomy bag present Extremities:No edema Neurology: Alert awake and following commands  Mihir Flanigan Tanna Furry 02/02/2020,11:16 AM  LOS: 2 days

## 2020-02-03 LAB — PHOSPHORUS: Phosphorus: 3.2 mg/dL (ref 2.5–4.6)

## 2020-02-03 LAB — CBC
HCT: 28 % — ABNORMAL LOW (ref 39.0–52.0)
Hemoglobin: 8.5 g/dL — ABNORMAL LOW (ref 13.0–17.0)
MCH: 27.2 pg (ref 26.0–34.0)
MCHC: 30.4 g/dL (ref 30.0–36.0)
MCV: 89.5 fL (ref 80.0–100.0)
Platelets: 212 10*3/uL (ref 150–400)
RBC: 3.13 MIL/uL — ABNORMAL LOW (ref 4.22–5.81)
RDW: 16.3 % — ABNORMAL HIGH (ref 11.5–15.5)
WBC: 8.1 10*3/uL (ref 4.0–10.5)
nRBC: 0 % (ref 0.0–0.2)

## 2020-02-03 LAB — GLUCOSE, CAPILLARY: Glucose-Capillary: 100 mg/dL — ABNORMAL HIGH (ref 70–99)

## 2020-02-03 LAB — RENAL FUNCTION PANEL
Albumin: 2.4 g/dL — ABNORMAL LOW (ref 3.5–5.0)
Anion gap: 10 (ref 5–15)
BUN: 64 mg/dL — ABNORMAL HIGH (ref 8–23)
CO2: 18 mmol/L — ABNORMAL LOW (ref 22–32)
Calcium: 9.1 mg/dL (ref 8.9–10.3)
Chloride: 110 mmol/L (ref 98–111)
Creatinine, Ser: 5.23 mg/dL — ABNORMAL HIGH (ref 0.61–1.24)
GFR, Estimated: 12 mL/min — ABNORMAL LOW (ref >60)
Glucose, Bld: 89 mg/dL (ref 70–99)
Phosphorus: 3.8 mg/dL (ref 2.5–4.6)
Potassium: 4.5 mmol/L (ref 3.5–5.1)
Sodium: 138 mmol/L (ref 135–145)

## 2020-02-03 LAB — MAGNESIUM: Magnesium: 1.1 mg/dL — ABNORMAL LOW (ref 1.7–2.4)

## 2020-02-03 MED ORDER — VITAL HIGH PROTEIN PO LIQD: 1000.0000 mL | ORAL | Status: DC

## 2020-02-03 MED ORDER — PROSOURCE TF PO LIQD
45.0000 mL | Freq: Three times a day (TID) | ORAL | Status: DC
  Administered 2020-02-03 – 2020-02-05 (×8): 45 mL
  Filled 2020-02-03 (×11): qty 45

## 2020-02-03 MED ORDER — PROSOURCE TF PO LIQD: 45.0000 mL | Freq: Two times a day (BID) | ORAL | Status: DC

## 2020-02-03 MED ORDER — OSMOLITE 1.5 CAL PO LIQD
780.0000 mL | ORAL | Status: DC
  Administered 2020-02-03 – 2020-02-05 (×3): 780 mL
  Filled 2020-02-03 (×6): qty 948

## 2020-02-03 NOTE — Progress Notes (Signed)
[facility] NEPHROLOGY PROGRESS NOTE  Assessment/ Plan: Pt is a 65 y.o. 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  The patient was unable to pass urine after removal of Foley catheter therefore he was sent to the ER. In the ER he was found to have potassium level of 6.8, elevated BUN and creatinine level almost 11. Patient was transferred from [facility], ER to [facility] for further evaluation.   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  Clinically much better.  He is nonoliguric and creatinine level trending down to 5.2 today.   D/C maintenance IV fluid and see if he can maintain hydration . Encourged him to drink, seems quite weak but can drink if cup nearby. UA with UTI s/p abx Kidney ultrasound ruled out obstruction however has CKD.  Strict ins and out, daily lab.  Watch for continued renal recovery. I think he should remain inpt for now so we can prove he can maintain hydration.  #Catheter related UTI: s/p therapy.   #Metabolic acidosis: Cont oral sodium bicarbonate.   #Hypotension: BP improved with IV fluid.   Subjective: Seen and examined at bedside.  Denies nausea vomiting chest pain shortness of breath. C/o dry mouth.  Urine output 1.15 L, net I/Os NR / 1.5.  No new event.  Objective Vital signs in last 24 hours: Vitals:   02/02/20 1130 02/02/20 1942 02/03/20  0000 02/03/20 0346  BP: 123/80 138/77 118/76 (!) 148/68  Pulse: 97 74 73 68  Resp: '16 17 13 18  '$ Temp: 98.4 F (36.9 C) 97.7 F (36.5 C) 98.5 F (36.9 C) 98.6 F (37 C)  TempSrc: Oral Oral Oral Oral  SpO2: 100% 100% 100% 100%  Weight:   53.5 kg   Height:       Weight change: -1.361 kg  Intake/Output Summary (Last 24 hours) at 02/03/2020 1107 Last data filed at 02/03/2020 0800 Gross per 24 hour  Intake 120 ml  Output 2600 ml  Net -2480 ml       Labs: Basic Metabolic Panel: Recent Labs  Lab 01/31/20 1451 02/01/20 0440 02/02/20 0336 02/03/20 0326  NA 129* 134* 136 138  K 6.5* 5.1 4.7 4.5  CL 95* 102 108 110  CO2 20* 18* 18* 18*  GLUCOSE 60* 69* 80 89  BUN 119* 105* 84* 64*  CREATININE 10.34* 8.95* 6.90* 5.23*  CALCIUM 10.1 9.3 9.1 9.1  PHOS 5.6*  --  4.7* 3.8   Liver Function Tests: Recent Labs  Lab 02/01/20 0440 02/02/20 0336 02/03/20 0326  AST 12*  --   --   ALT 12  --   --   ALKPHOS 75  --   --   BILITOT 0.9  --   --   PROT 6.6  --   --   ALBUMIN 2.5* 2.3* 2.4*   No results for input(s): LIPASE, AMYLASE in the last 168 hours. No results for input(s): AMMONIA in the last 168 hours. CBC: Recent  Labs  Lab 01/31/20 0236 02/01/20 0440 02/02/20 0336 02/03/20 0326  WBC 8.8 7.4 6.5 8.1  NEUTROABS 6.7  --   --   --   HGB 10.2* 8.4* 7.9* 8.5*  HCT 33.4* 27.2* 25.9* 28.0*  MCV 89.1 88.6 89.0 89.5  PLT 238 218 204 212   Cardiac Enzymes: Recent Labs  Lab 01/31/20 0236  CKTOTAL 38*   CBG: Recent Labs  Lab 01/31/20 1113 02/01/20 2057  GLUCAP 67* 78    Iron Studies:  Recent Labs    02/02/20 0809  IRON 78  TIBC 165*  FERRITIN 1,124*   Studies/Results: No results found.  Medications: Infusions: . sodium chloride 100 mL/hr at 02/02/20 2356    Scheduled Medications: . Chlorhexidine Gluconate Cloth  6 each Topical Daily  . diphenoxylate-atropine  1 tablet Oral QID  . feeding supplement (NEPRO CARB STEADY)  237 mL Oral TID BM  .  heparin injection (subcutaneous)  5,000 Units Subcutaneous Q8H  . liver oil-zinc oxide   Topical BID  . metoprolol tartrate  25 mg Oral BID  . multivitamin with minerals  1 tablet Oral Daily  . sodium bicarbonate  1,300 mg Oral BID  . vitamin C  250 mg Oral BID    have reviewed scheduled and prn medications.  Physical Exam: General:NAD, comfortable, sleepy ENT:  Mouth dry Heart:RRR, s1s2 nl Lungs:clear b/l, no crackle Abdomen:soft, Non-tender, ileostomy bag present Extremities:No edema Neurology: Alert awake and following commands  Justin Mend 02/03/2020,11:07 AM  LOS: 3 days

## 2020-02-03 NOTE — TOC Progression Note (Signed)
Transition of Care [facility]) - Progression Note    Patient Details  Name: Curtis Solis MRN: [id number] Date of Birth: 1955-06-04  [facility] CM/SW Contact  Zenon Mayo, RN Phone Number: 02/03/2020, 6:01 PM  Clinical Narrative:    from [facility] SNF,  On 5 liters, cret elevaqted,  s/p open AAA and bilateral common iliac artery aneurysm repair with complicated postoperative course.  Vascular consulted for evaluation of right foot wounds. TOC to follow.        Expected Discharge Plan and Services                                                 Social Determinants of Health (SDOH) Interventions    Readmission Risk Interventions No flowsheet data found.

## 2020-02-03 NOTE — Plan of Care (Signed)
°  Problem: Education: °Goal: Ability to demonstrate management of disease process will improve °Outcome: Progressing °Goal: Ability to verbalize understanding of medication therapies will improve °Outcome: Progressing °Goal: Individualized Educational Video(s) °Outcome: Progressing °  °

## 2020-02-03 NOTE — Progress Notes (Signed)
PROGRESS NOTE    Curtis Solis  [id number] DOB: 17-Sep-1955 DOA: 01/31/2020 PCP: [name], MD  Brief Narrative: 65 year old male with history of hypertension, PAD, tobacco abuse who was admitted to [facility] in mid September 2021 for repair of abdominal aortic aneurysm 9/17 this led to numerous complications including, embolization of aortic thrombus to the left lower extremity eventually requiring left above-knee amputation. -Also complicated by acute kidney injury, rhabdomyolysis requiring CRRT from 9/18 started intermittent hemodialysis on 9/23. -10/12 had cardiac arrest due to tamponade requiring pericardial drain placement -then developed ischemic bowel, emergent OR 10/12 underwent small bowel resection with open abdomen, went back to the OR on 10/17 had ileostomy placed. -On 10/19 had a Foley placed developed necrosis of foreskin with slough, required blunt dissection to place Foley catheter -On 10/24 was extubated -11/1 had tunneled HD catheter placement -Then had gradual improvement in his kidney function and was transitioned off hemodialysis, creatinine was in the 3 range at discharge Eventually discharged to [facility] on 11/15 with a Foley catheter. -In December went from [place] to [place] by urology in follow-up 1/20 and had Foley catheter removed after he passed a voiding trial and was discharged back to [facility], patient was unable to void 6 hours later and was sent to our ED 1/21. -In the emergency room he was noted to have BUN of 108 with a creatinine of 10.9, potassium of 6.8, renal ultrasound did not show hydronephrosis, did have a Foley catheter placed with 200 cc urine drained, also noted to have necrotic changes in his right foot/toes.  Assessment & Plan:   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 Hyperkalemia -Baseline creatinine -3, from 11/21, creatinine 10.9 on admission with K of 6.8 -This is prerenal from high output ileostomy, ultrasound negative for  hydronephrosis, Foley catheter placed in the ED 1/21 -Improving with [facility] and aggressive hydration -Creatinine down to 5.2 today IV fluids 1-2 more days -Also started Lomotil, monitor ileostomy output closely -Appreciate nephrology input -Ambulate, PT OT -Labs in a.m. -TOC consult for discharge planning  Necrotic toes with wounds, serosanguineous discharge -Has palpable dorsalis pedis and posterior tibial pulses, appreciate vascular input, recommended to keep the wounds dry and toes separated, ultimately this will demarcate and likely require transmetatarsal amputation -Recommended vascular follow-up in [facility] -Continue wound care  Small bowel ischemia, s/p small bowel resection Terminal ileostomy -Concern for high output and poor absorption, monitor ostomy output daily -Continue Lomotil 4 times daily, add Imodium as needed -recorded output 625 and 350 over the last 2 days, suspect this may be.  Abdominal wall wound Penis wound -Underwent small bowel resection for ischemia, R iIeostomy -Wound care consult  History of A. Fib -Noted during recent hospitalization -In sinus rhythm now, not on anticoagulation at discharge (chart summary reviewed) I wonder if this was secondary to tamponade or some bleeding complications -Follow-up with cardiology at [place] associated UTI -Catheter was just removed prior to this admission, UA is abnormal, treated with 3 days of cefepime -Urine culture with multiple species, discontinue antibiotics  History of cardiac arrest, pericardial tamponade -Requiring pericardial window and drain 2 months prior  DVT prophylaxis: heparin subcutaneous Code Status: DNR Family Communication: Updated patient's son [name] Disposition Plan:  Status is: Inpatient  Remains inpatient appropriate because:Inpatient level of care appropriate due to severity of illness   Dispo: The patient is from: SNF              Anticipated d/c is to:  SNF              Anticipated d/c date is: 48 hours              Patient currently is not medically stable to d/c.   Difficult to place patient No   Consultants:   Nephrology  Vascular surgery   Procedures:   Antimicrobials:    Subjective: -Feels well overall, denies any complaints, oral intake is somewhat poor  Objective: Vitals:   02/02/20 1130 02/02/20 1942 02/03/20 0000 02/03/20 0346  BP: 123/80 138/77 118/76 (!) 148/68  Pulse: 97 74 73 68  Resp: '16 17 13 18  '$ Temp: 98.4 F (36.9 C) 97.7 F (36.5 C) 98.5 F (36.9 C) 98.6 F (37 C)  TempSrc: Oral Oral Oral Oral  SpO2: 100% 100% 100% 100%  Weight:   53.5 kg   Height:        Intake/Output Summary (Last 24 hours) at 02/03/2020 [id number] Last data filed at 02/03/2020 0800 Gross per 24 hour  Intake 120 ml  Output 2600 ml  Net -2480 ml   Filed Weights   02/01/20 0000 02/02/20 0500 02/03/20 0000  Weight: 54 kg 54.9 kg 53.5 kg    Examination:  General exam: Frail pleasant male appears much older than stated age, awake alert oriented to self place and partly to time CVS: S1-S2, regular rate rhythm Lungs: Clear bilaterally Abdomen: Soft, ileostomy with liquid stool, large abdominal scar healed with secondary intention, bowel sounds present Extremities: No edema Skin: No rashes on exposed skin Psychiatry: Mood & affect appropriate.   Data Reviewed:   CBC: Recent Labs  Lab 01/31/20 0236 02/01/20 0440 02/02/20 0336 02/03/20 0326  WBC 8.8 7.4 6.5 8.1  NEUTROABS 6.7  --   --   --   HGB 10.2* 8.4* 7.9* 8.5*  HCT 33.4* 27.2* 25.9* 28.0*  MCV 89.1 88.6 89.0 89.5  PLT 238 218 204 [id number]   Basic Metabolic Panel: Recent Labs  Lab 01/31/20 0923 01/31/20 1451 02/01/20 0440 02/02/20 0336 02/03/20 0326  NA 129* 129* 134* 136 138  K 6.1* 6.5* 5.1 4.7 4.5  CL 93* 95* 102 108 110  CO2 20* 20* 18* 18* 18*  GLUCOSE 59* 60* 69* 80 89  BUN 123* 119* 105* 84* 64*  CREATININE 10.34* 10.34* 8.95* 6.90* 5.23*  CALCIUM  10.8* 10.1 9.3 9.1 9.1  PHOS  --  5.6*  --  4.7* 3.8   GFR: Estimated Creatinine Clearance: 11.1 mL/min (A) (by C-G formula based on SCr of 5.23 mg/dL (H)). Liver Function Tests: Recent Labs  Lab 01/31/20 1451 02/01/20 0440 02/02/20 0336 02/03/20 0326  AST  --  12*  --   --   ALT  --  12  --   --   ALKPHOS  --  75  --   --   BILITOT  --  0.9  --   --   PROT  --  6.6  --   --   ALBUMIN 2.9* 2.5* 2.3* 2.4*   No results for input(s): LIPASE, AMYLASE in the last 168 hours. No results for input(s): AMMONIA in the last 168 hours. Coagulation Profile: No results for input(s): INR, PROTIME in the last 168 hours. Cardiac Enzymes: Recent Labs  Lab 01/31/20 0236  CKTOTAL 38*   BNP (last 3 results) No results for input(s): PROBNP in the last 8760 hours. HbA1C: No results for input(s): HGBA1C in the last 72 hours. CBG: Recent Labs  Lab 01/31/20 1113 02/01/20  2057  GLUCAP 67* 78   Lipid Profile: No results for input(s): CHOL, HDL, LDLCALC, TRIG, CHOLHDL, LDLDIRECT in the last 72 hours. Thyroid Function Tests: No results for input(s): TSH, T4TOTAL, FREET4, T3FREE, THYROIDAB in the last 72 hours. Anemia Panel: Recent Labs    02/02/20 0336 02/02/20 0809  VITAMINB12  --  1,708*  FOLATE  --  8.1  FERRITIN  --  1,124*  TIBC  --  165*  IRON  --  78  RETICCTPCT 2.1  --    Urine analysis:    Component Value Date/Time   COLORURINE YELLOW 01/31/2020 0539   APPEARANCEUR CLOUDY (A) 01/31/2020 0539   LABSPEC 1.012 01/31/2020 0539   PHURINE 7.0 01/31/2020 0539   GLUCOSEU NEGATIVE 01/31/2020 0539   HGBUR LARGE (A) 01/31/2020 0539   BILIRUBINUR NEGATIVE 01/31/2020 [place] 01/31/2020 0539   PROTEINUR 100 (A) 01/31/2020 0539   NITRITE NEGATIVE 01/31/2020 0539   LEUKOCYTESUR LARGE (A) 01/31/2020 0539   Sepsis Labs: '@LABRCNTIP'$ (procalcitonin:4,lacticidven:4)  ) Recent Results (from the past 240 hour(s))  SARS Coronavirus 2 by RT PCR (hospital order, performed  in [place] hospital lab) Nasopharyngeal Nasopharyngeal Swab     Status: None   Collection Time: 01/31/20  3:45 AM   Specimen: Nasopharyngeal Swab  Result Value Ref Range Status   SARS Coronavirus 2 NEGATIVE NEGATIVE Final    Comment: (NOTE) SARS-CoV-2 target nucleic acids are NOT DETECTED.  The SARS-CoV-2 RNA is generally detectable in upper and lower respiratory specimens during the acute phase of infection. The lowest concentration of SARS-CoV-2 viral copies this assay can detect is 250 copies / mL. A negative result does not preclude SARS-CoV-2 infection and should not be used as the sole basis for treatment or other patient management decisions.  A negative result may occur with improper specimen collection / handling, submission of specimen other than nasopharyngeal swab, presence of viral mutation(s) within the areas targeted by this assay, and inadequate number of viral copies (<250 copies / mL). A negative result must be combined with clinical observations, patient history, and epidemiological information.  Fact Sheet for Patients:   StrictlyIdeas.no  Fact Sheet for Healthcare Providers: BankingDealers.co.za  This test is not yet approved or  cleared by the [place] FDA and has been authorized for detection and/or diagnosis of SARS-CoV-2 by FDA under an Emergency Use Authorization (EUA).  This EUA will remain in effect (meaning this test can be used) for the duration of the COVID-19 declaration under Section 564(b)(1) of the Act, 21 U.S.C. section 360bbb-3(b)(1), unless the authorization is terminated or revoked sooner.  Performed at [facility], [place] [address] [place], [place] [address]   Urine culture     Status: Abnormal   Collection Time: 01/31/20  6:33 AM   Specimen: Urine, Random  Result Value Ref Range Status   Specimen Description URINE, RANDOM  Final   Special Requests   Final     NONE Performed at [facility], [place] [address] [place], [place] [address]    Culture MULTIPLE SPECIES PRESENT, SUGGEST RECOLLECTION (A)  Final   Report Status 02/01/2020 FINAL  Final    Radiology Studies: No results found.      Scheduled Meds: . Chlorhexidine Gluconate Cloth  6 each Topical Daily  . diphenoxylate-atropine  1 tablet Oral QID  . feeding supplement (NEPRO CARB STEADY)  237 mL Oral TID BM  . heparin injection (subcutaneous)  5,000 Units Subcutaneous Q8H  . liver oil-zinc  oxide   Topical BID  . metoprolol tartrate  25 mg Oral BID  . multivitamin with minerals  1 tablet Oral Daily  . sodium bicarbonate  1,300 mg Oral BID  . vitamin C  250 mg Oral BID   Continuous Infusions: . sodium chloride 100 mL/hr at 02/02/20 2356     LOS: 3 days    Time spent: 31mn  PDomenic Polite MD [facility]  02/03/2020, 9:59 AM

## 2020-02-03 NOTE — Progress Notes (Signed)
Nutrition Follow-up  DOCUMENTATION CODES:   Severe malnutrition in context of chronic illness,Underweight  INTERVENTION:   -Continue MVI with minerals daily -D/c Nepro -Initiate nocturnal feedings:   Osmolite 1.5 @ 65 ml/hr via PEG x 12 hours  45 ml Prosource TF TID   175 ml free water flush every 4 hours  Tube feeding regimen provides 1290 kcal (66% of needs), 82 grams of protein, and 594 ml of H2O. Total free water: 1644 ml daily   NUTRITION DIAGNOSIS:   Severe Malnutrition related to chronic illness (ischemic bowel) as evidenced by energy intake < or equal to 50% for > or equal to 1 month,moderate fat depletion,severe fat depletion,severe muscle depletion.  Ongoing  GOAL:   Patient will meet greater than or equal to 90% of their needs  Progressing  MONITOR:   PO intake,Labs,Weight trends,TF tolerance,Skin,I & O's  REASON FOR ASSESSMENT:   Consult Assessment of nutrition requirement/status,Enteral/tube feeding initiation and management  ASSESSMENT:   65 y.o. 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, ischemic bowel s/p small bowel resection(s) and ileostomy creation with open abdomen and skin graft, G-tube placement 10/20, cardiac arrest due to tamponade requiring pericardial drain placement and removal during admission, acute kidney failure requiring CRRT then iHD, C-diff, MRSA bacteremia, aspiration PNA, urinary retention with foley catheter in place up until removal 1/20 and ischemia of R foot who is now admitted with AKI and UTI  Reviewed I/O's: -1.5 L x 24 hours and -404 ml since admission  UOP: 1.2 L x 24 hours  Ileostomy output: 350 ml x 24 hours  Case discussed with RN, who reports pt with high output from ileostomy. Pt also with poor appetite, only consuming bites and sips with meals.   Spoke with pt at bedside, who reports a  general decline in health over the past 4 months, after an extended hospitalization at [facility]. Pt reports that his appetite is extremely poor secondary to nausea. Per pt, PTA he was consuming 3 meals per day, but only consuming bites and sips at meals due to poor oral intake. He acknowledges that he still has PEG tube, but has not been using it since being at [facility].   Per pt, he has had increased ileostomy output since admission ("they've changed it 3 or 4 times already"). PTA pt reports having to change ileostomy bag TID.   Pt reports UBW is around 190#. He estimates he has lost about 50-60# over the past 4 months.    RD dicussed ways to improve intake, including ordering cold foods with less odors to help combat nausea. [place] sandwich at bedside- pt reports he intends to try this later. Discussed plan for nocturnal feedings; explained to pt that he could still eat and drink, but would get further support from TF at night. Pt agreeable to plan and appreciative of RD visit.   Labs reviewed: CBGS: 78 (inpatient orders for glycemic control are none).   NUTRITION - FOCUSED PHYSICAL EXAM:  Flowsheet Row Most Recent Value  Orbital Region Moderate depletion  Upper Arm Region Severe depletion  Thoracic and Lumbar Region Severe depletion  Buccal Region Moderate depletion  Temple Region Severe depletion  Clavicle Bone Region Severe depletion  Clavicle and Acromion Bone Region Severe depletion  Scapular Bone Region Severe depletion  Dorsal Hand Severe depletion  Patellar Region Severe depletion  Anterior Thigh Region  Severe depletion  Posterior Calf Region Severe depletion  Edema (RD Assessment) None  Hair Reviewed  Eyes Reviewed  Mouth Reviewed  Skin Reviewed  Nails Reviewed       Diet Order:   Diet Order            Diet Heart Room service appropriate? Yes; Fluid consistency: Thin  Diet effective now                 EDUCATION NEEDS:   Education needs have been addressed  Skin:   Skin Assessment: Skin Integrity Issues: Skin Integrity Issues:: Other (Comment),Incisions,Stage II Stage II: bilateral buttocks Incisions: dehisced abdomen, rt thigh Other: non pressure wound lt thigh, rt anterior toe wound,  Last BM:  02/03/20 (750 ml via ileostomy)  Height:   Ht Readings from Last 1 Encounters:  01/31/20 '5\' 11"'$  (1.803 m)    Weight:   Wt Readings from Last 1 Encounters:  02/03/20 53.5 kg    Ideal Body Weight:  71.9 kg (adjusted for lt AKA)  BMI:  Body mass index is 16.46 kg/m.  Estimated Nutritional Needs:   Kcal:  1950-2150  Protein:  105-120 grams  Fluid:  > 2 L    Loistine Chance, RD, LDN, [place] Registered Dietitian II Certified Diabetes Care and Education Specialist Please refer to [facility] for RD and/or RD on-call/weekend/after hours pager

## 2020-02-04 DIAGNOSIS — E43 Unspecified severe protein-calorie malnutrition: Secondary | ICD-10-CM | POA: Insufficient documentation

## 2020-02-04 LAB — CBC
HCT: 27.4 % — ABNORMAL LOW (ref 39.0–52.0)
Hemoglobin: 8.5 g/dL — ABNORMAL LOW (ref 13.0–17.0)
MCH: 27.8 pg (ref 26.0–34.0)
MCHC: 31 g/dL (ref 30.0–36.0)
MCV: 89.5 fL (ref 80.0–100.0)
Platelets: 187 10*3/uL (ref 150–400)
RBC: 3.06 MIL/uL — ABNORMAL LOW (ref 4.22–5.81)
RDW: 16.5 % — ABNORMAL HIGH (ref 11.5–15.5)
WBC: 7.9 10*3/uL (ref 4.0–10.5)
nRBC: 0 % (ref 0.0–0.2)

## 2020-02-04 LAB — PHOSPHORUS
Phosphorus: 2.9 mg/dL (ref 2.5–4.6)
Phosphorus: 2.1 mg/dL — ABNORMAL LOW (ref 2.5–4.6)

## 2020-02-04 LAB — BASIC METABOLIC PANEL
Anion gap: 9 (ref 5–15)
BUN: 55 mg/dL — ABNORMAL HIGH (ref 8–23)
CO2: 22 mmol/L (ref 22–32)
Calcium: 9.3 mg/dL (ref 8.9–10.3)
Chloride: 107 mmol/L (ref 98–111)
Creatinine, Ser: 4.27 mg/dL — ABNORMAL HIGH (ref 0.61–1.24)
GFR, Estimated: 15 mL/min — ABNORMAL LOW (ref >60)
Glucose, Bld: 136 mg/dL — ABNORMAL HIGH (ref 70–99)
Potassium: 4.2 mmol/L (ref 3.5–5.1)
Sodium: 138 mmol/L (ref 135–145)

## 2020-02-04 LAB — MAGNESIUM
Magnesium: 1.1 mg/dL — ABNORMAL LOW (ref 1.7–2.4)
Magnesium: 1 mg/dL — ABNORMAL LOW (ref 1.7–2.4)

## 2020-02-04 LAB — GLUCOSE, CAPILLARY
Glucose-Capillary: 102 mg/dL — ABNORMAL HIGH (ref 70–99)
Glucose-Capillary: 117 mg/dL — ABNORMAL HIGH (ref 70–99)
Glucose-Capillary: 142 mg/dL — ABNORMAL HIGH (ref 70–99)
Glucose-Capillary: 158 mg/dL — ABNORMAL HIGH (ref 70–99)

## 2020-02-04 MED ORDER — ONDANSETRON HCL 4 MG/2ML IJ SOLN
4.0000 mg | Freq: Three times a day (TID) | INTRAMUSCULAR | Status: DC
  Administered 2020-02-04 – 2020-02-06 (×5): 4 mg via INTRAVENOUS
  Filled 2020-02-04 (×6): qty 2

## 2020-02-04 MED ORDER — LOPERAMIDE HCL 2 MG PO CAPS
2.0000 mg | ORAL_CAPSULE | Freq: Two times a day (BID) | ORAL | Status: DC
  Administered 2020-02-04 – 2020-02-06 (×6): 2 mg via ORAL
  Filled 2020-02-04 (×6): qty 1

## 2020-02-04 MED ORDER — FREE WATER
300.0000 mL | Status: DC
  Administered 2020-02-04 – 2020-02-06 (×4): 300 mL

## 2020-02-04 MED ORDER — MAGNESIUM SULFATE 2 GM/50ML IV SOLN
2.0000 g | Freq: Two times a day (BID) | INTRAVENOUS | Status: AC
  Administered 2020-02-04 – 2020-02-05 (×2): 2 g via INTRAVENOUS
  Filled 2020-02-04 (×2): qty 50

## 2020-02-04 MED ORDER — PROMETHAZINE HCL 25 MG/ML IJ SOLN: 12.5000 mg | Freq: Four times a day (QID) | INTRAMUSCULAR | Status: DC | PRN

## 2020-02-04 NOTE — NC FL2 (Addendum)
[place] LEVEL OF CARE SCREENING TOOL     IDENTIFICATION  Patient Name: Curtis Solis Birthdate: 01-17-1955 Sex: male Admission Date (Current Location): 01/31/2020  [facility] and [place] Number:      Facility and Address:  [facility], [place] [address], [place],  [address]      Provider Number: [id number]  Attending Physician Name and Address:  Domenic Polite, MD  Relative Name and Phone Number:  Dieter, Huyck     [phone]    Current Level of Care: Hospital Recommended Level of Care: [place] Prior Approval Number:    Date Approved/Denied:   PASRR Number: [id number]  Discharge Plan:      Current Diagnoses: Patient Active Problem List   Diagnosis Date Noted  . Protein-calorie malnutrition, severe 02/04/2020  . Acute kidney failure ([place]) 01/31/2020  . Hyperkalemia 01/31/2020  . Uremia 01/31/2020  . Necrotic toes ([place]) 01/31/2020  . Colostomy present ([place]) 01/31/2020  . Open abdominal wall wound, subsequent encounter 01/31/2020  . Open wound of penis 01/31/2020  . Acute lower UTI 01/31/2020  . Pressure injury of skin 01/31/2020    Orientation RESPIRATION BLADDER Height & Weight     Self,Time,Situation,Place  Normal Indwelling catheter Weight: 118 lb (53.5 kg) Height:  '5\' 11"'$  (180.3 cm)  BEHAVIORAL SYMPTOMS/MOOD NEUROLOGICAL BOWEL NUTRITION STATUS      Continent Feeding tube (J tube)  AMBULATORY STATUS COMMUNICATION OF NEEDS Skin   Extensive Assist Verbally Surgical wounds,Other (Comment) (Incision thigh, abdomen, and toe; non pressure wound left thigh posterior)                       Personal Care Assistance Level of Assistance  Bathing,Feeding,Dressing Bathing Assistance: Limited assistance Feeding assistance: Independent Dressing Assistance: Limited assistance     Functional Limitations Info  Sight,Hearing,Speech Sight Info: Adequate Hearing Info: Adequate Speech Info:  Adequate    SPECIAL CARE FACTORS FREQUENCY  PT (By licensed PT),OT (By licensed OT)     PT Frequency: 5x/week OT Frequency: 5x/week            Contractures Contractures Info: Not present    Additional Factors Info  Code Status,Allergies Code Status Info: DNR Allergies Info: No known allergies           Current Medications (02/04/2020):  This is the current hospital active medication list Current Facility-Administered Medications  Medication Dose Route Frequency Provider Last Rate Last Admin  . acetaminophen (TYLENOL) tablet 650 mg  650 mg Oral Q6H PRN Etta Quill, DO   650 mg at 02/02/20 1526   Or  . acetaminophen (TYLENOL) suppository 650 mg  650 mg Rectal Q6H PRN Etta Quill, DO      . Chlorhexidine Gluconate Cloth 2 % PADS 6 each  6 each Topical Daily Domenic Polite, MD   6 each at 02/04/20 0900  . diphenoxylate-atropine (LOMOTIL) 2.5-0.025 MG per tablet 1 tablet  1 tablet Oral QID Domenic Polite, MD   1 tablet at 02/04/20 1150  . feeding supplement (OSMOLITE 1.5 CAL) liquid 780 mL  780 mL Per Tube Continuous Domenic Polite, MD 65 mL/hr at 02/03/20 2007 780 mL at 02/03/20 2007  . feeding supplement (PROSource TF) liquid 45 mL  45 mL Per Tube TID Domenic Polite, MD   45 mL at 02/04/20 1153  . free water 300 mL  300 mL Per Tube 2 times per day Domenic Polite, MD      . heparin  injection 5,000 Units  5,000 Units Subcutaneous Q8H Domenic Polite, MD   5,000 Units at 02/04/20 0631  . liver oil-zinc oxide (DESITIN) 40 % ointment   Topical BID Domenic Polite, MD   Given at 02/04/20 1153  . loperamide (IMODIUM) capsule 2 mg  2 mg Oral BID Domenic Polite, MD   2 mg at 02/04/20 1156  . magnesium sulfate IVPB 2 g 50 mL  2 g Intravenous BID Domenic Polite, MD      . metoprolol tartrate (LOPRESSOR) tablet 25 mg  25 mg Oral BID Domenic Polite, MD   25 mg at 02/04/20 1149  . multivitamin with minerals tablet 1 tablet  1 tablet Oral Daily Domenic Polite, MD   1 tablet  at 02/04/20 1150  . ondansetron (ZOFRAN) injection 4 mg  4 mg Intravenous TID Brooke Bonito, MD      . promethazine (PHENERGAN) injection 12.5 mg  12.5 mg Intravenous Q6H PRN Domenic Polite, MD      . sodium bicarbonate tablet 1,300 mg  1,300 mg Oral BID Rosita Fire, MD   1,300 mg at 02/04/20 1149  . vitamin C (ASCORBIC ACID) tablet 250 mg  250 mg Oral BID Domenic Polite, MD   250 mg at 02/04/20 1154     Discharge Medications: Please see discharge summary for a list of discharge medications.  Relevant Imaging Results:  Relevant Lab Results:   Additional Information SSN [place], [place]

## 2020-02-04 NOTE — TOC Initial Note (Signed)
Transition of Care [facility]) - Initial/Assessment Note    Patient Details  Name: Curtis Solis MRN: [id number] Date of Birth: 1955/09/04  [facility]) CM/SW Contact:    Bethann Berkshire, [place] Phone Number: 02/04/2020, 1:34 PM  Clinical Narrative:                 CSW met with pt responding to SNF consult. Consult stated that pt was from [facility] but wanted different SNF. Pt confirmed he's been at [facility]. He explains he was interested in seeking additional facilities that do more intensive PT. Pt explained he would like to return to [facility] if he isn't able to get any preferable offers. Fl2 complete and CSW faxed bed requests in hub.   Expected Discharge Plan: [place] Barriers to Discharge: Continued Medical Work up   Patient Goals and CMS Choice Patient states their goals for this hospitalization and ongoing recovery are:: [name], Curtis Solis (Spouse)   [phone] (Mobile) CMS Medicare.gov Compare Post Acute Care list provided to:: Patient Choice offered to / list presented to : Patient  Expected Discharge Plan and Services Expected Discharge Plan: [place] arrangements for the past 2 months: [place]                                      Prior Living Arrangements/Services Living arrangements for the past 2 months: [place] Lives with:: Facility Resident          Need for Family Participation in Patient Care: Yes (Comment) Care giver support system in place?: Yes (comment)   Criminal Activity/Legal Involvement Pertinent to Current Situation/Hospitalization: No - Comment as needed  Activities of Daily Living Home Assistive Devices/Equipment: Wheelchair ADL Screening (condition at time of admission) Patient's cognitive ability adequate to safely complete daily activities?: No Is the patient deaf or have difficulty hearing?: No Does the patient have difficulty seeing,  even when wearing glasses/contacts?: No Does the patient have difficulty concentrating, remembering, or making decisions?: No Patient able to express need for assistance with ADLs?: Yes Does the patient have difficulty dressing or bathing?: Yes Independently performs ADLs?: No Communication: Independent Dressing (OT): Needs assistance Is this a change from baseline?: Pre-admission baseline Grooming: Independent Is this a change from baseline?: Pre-admission baseline Feeding: Independent Bathing: Needs assistance Is this a change from baseline?: Pre-admission baseline Toileting: Needs assistance Is this a change from baseline?: Pre-admission baseline In/Out Bed: Needs assistance Is this a change from baseline?: Pre-admission baseline Walks in Home: Needs assistance Is this a change from baseline?: Pre-admission baseline Does the patient have difficulty walking or climbing stairs?: Yes Weakness of Legs: Both Weakness of Arms/Hands: Both  Permission Sought/Granted   Permission granted to share information with : Yes, Verbal Permission Granted  Share Information with NAME: Son's Curtis Solis and Curtis Solis           Emotional Assessment Appearance:: Appears stated age Attitude/Demeanor/Rapport: Engaged Affect (typically observed): Accepting Orientation: : Oriented to Self,Oriented to Place,Oriented to  Time,Oriented to Situation Alcohol / Substance Use: Not Applicable Psych Involvement: No (comment)  Admission diagnosis:  Hyperkalemia [E87.5] Hyponatremia [E87.1] Normocytic anemia [D64.9] Necrotic toes (HCC) [I96] Acute kidney failure (HCC) [N17.9] Urinary tract infection with hematuria, site unspecified [N39.0, R31.9] Acute renal failure with oliguria ([place]) [N17.9, R34] Patient Active Problem List   Diagnosis Date Noted  . Protein-calorie  malnutrition, severe 02/04/2020  . Acute kidney failure ([place]) 01/31/2020  . Hyperkalemia 01/31/2020  . Uremia 01/31/2020  . Necrotic toes  ([place]) 01/31/2020  . Colostomy present ([place]) 01/31/2020  . Open abdominal wall wound, subsequent encounter 01/31/2020  . Open wound of penis 01/31/2020  . Acute lower UTI 01/31/2020  . Pressure injury of skin 01/31/2020   PCP:  [name], MD Pharmacy:  No Pharmacies Listed    Social Determinants of Health (SDOH) Interventions    Readmission Risk Interventions No flowsheet data found.

## 2020-02-04 NOTE — Progress Notes (Signed)
PROGRESS NOTE    Curtis Solis  [id number] DOB: [birth year] DOA: 01/31/2020 PCP: [name], MD  Brief Narrative: 65 year old male with history of hypertension, PAD, tobacco abuse who was admitted to 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 -Also complicated by acute kidney injury, rhabdomyolysis requiring CRRT from 9/18 started intermittent hemodialysis on 9/23. -10/12 had cardiac arrest due to tamponade requiring pericardial drain placement -then developed ischemic bowel, emergent OR 10/12 underwent small bowel resection with open abdomen, went back to the OR on 10/17 had ileostomy. -On 10/19 had a Foley placed developed necrosis of foreskin of penis with slough, required blunt dissection to place Foley catheter -On 10/24 was extubated -11/1 had tunneled HD catheter placement -Then had gradual improvement in his kidney function and was transitioned off hemodialysis, creatinine was in the 3 range at discharge Eventually discharged to [facility] on 11/15 with a Foley catheter. -In December went from [place] to [place] by urology in follow-up 1/20 and had Foley catheter removed after he passed a voiding trial and was discharged back to [facility], patient was unable to void 6 hours later and was sent to our ED 1/21. -In the emergency room he was noted to have BUN of 108 with a creatinine of 10.9, potassium of 6.8, renal ultrasound did not show hydronephrosis, did have a Foley catheter placed with 200 cc urine drained, also noted to have necrotic changes in his right foot/toes.  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 -TOC consult for discharge planning  Necrotic toes with wounds, serosanguineous discharge -Has palpable dorsalis pedis and posterior tibial pulses, appreciate vascular input, recommended to keep the wounds dry and toes separated, ultimately this will demarcate and likely require transmetatarsal amputation -Recommended vascular follow-up in [facility] -Continue wound care  Small bowel ischemia, s/p small bowel resection Terminal ileostomy -Concern for high output and poor absorption, monitor ostomy output daily -Continue Lomotil 4 times daily, add Imodium BID -750 mL stool recorded in the last 24 hours, per nursing staff likely much more than that  Abdominal wall wound Penis wound -Underwent small bowel resection for ischemia, R iIeostomy -Wound care consult appreciated  History of A. Fib -Noted during recent hospitalization -In sinus rhythm now, not on anticoagulation at discharge (chart summary reviewed) I wonder if this was secondary to tamponade or some bleeding complications -Follow-up with cardiology at [place] associated UTI -Catheter was just removed prior to this admission, UA is abnormal, treated with 3 days of cefepime -Catheter was replaced in the ED on the day of admission due to urinary retention -Urine culture with multiple species, discontinue antibiotics -Follow-up with urology at [facility]  History of cardiac arrest, pericardial tamponade -Requiring pericardial window and drain 2 months prior  DVT prophylaxis: heparin subcutaneous Code Status: DNR Family Communication: Updated patient's son [name]  1/24 Disposition Plan:  Status is: Inpatient  Remains inpatient appropriate because:Inpatient  level of care appropriate due to severity of illness   Dispo: The patient is from: SNF              Anticipated d/c is to: SNF              Anticipated d/c date is: 48 hours              Patient currently is not medically stable to d/c.   Difficult to place patient No   Consultants:   Nephrology  Vascular surgery   Procedures:   Antimicrobials:    Subjective: -Feels okay overall, complains of nausea, oral intake remains poor, started tube feeds at night  Objective: Vitals:   02/03/20 2000 02/03/20 2354 02/04/20 0500 02/04/20 0630  BP: 120/70 105/69 137/70   Pulse: 70 66 66   Resp: '16 17 16   '$ Temp: 98.6 F (37 C) 98.8 F (37.1 C) 98 F (36.7 C)   TempSrc: Oral Oral Oral   SpO2: 100% 100% 100%   Weight:    53.5 kg  Height:        Intake/Output Summary (Last 24 hours) at 02/04/2020 1142 Last data filed at 02/04/2020 1000 Gross per 24 hour  Intake 600 ml  Output 775 ml  Net -175 ml   Filed Weights   02/02/20 0500 02/03/20 0000 02/04/20 0630  Weight: 54.9 kg 53.5 kg 53.5 kg    Examination:  General exam: Frail pleasant male appears much older than stated age, AAO x3, no distress CVS: S1-S2, regular rate rhythm Lungs: Clear bilaterally Abdomen: Soft, ileostomy with large amount of liquid stool, large abdominal scar healed with secondary intention, bowel sounds present Extremities: No edema Skin: No rashes on exposed skin Psychiatry: Mood & affect appropriate.   Data Reviewed:   CBC: Recent Labs  Lab 01/31/20 0236 02/01/20 0440 02/02/20 0336 02/03/20 0326 02/04/20 0455  WBC 8.8 7.4 6.5 8.1 7.9  NEUTROABS 6.7  --   --   --   --   HGB 10.2* 8.4* 7.9* 8.5* 8.5*  HCT 33.4* 27.2* 25.9* 28.0* 27.4*  MCV 89.1 88.6 89.0 89.5 89.5  PLT 238 218 204 212 [id number]   Basic Metabolic Panel: Recent Labs  Lab 01/31/20 1451 02/01/20 0440 02/02/20 0336 02/03/20 0326  02/03/20 1653 02/04/20 0455  NA 129* 134* 136 138  --  138  K 6.5* 5.1 4.7 4.5  --  4.2  CL 95* 102 108 110  --  107  CO2 20* 18* 18* 18*  --  22  GLUCOSE 60* 69* 80 89  --  136*  BUN 119* 105* 84* 64*  --  55*  CREATININE 10.34* 8.95* 6.90* 5.23*  --  4.27*  CALCIUM 10.1 9.3 9.1 9.1  --  9.3  MG  --   --   --   --  1.1* 1.1*  PHOS 5.6*  --  4.7* 3.8 3.2 2.9   GFR: Estimated Creatinine Clearance: 13.6 mL/min (A) (by C-G formula based on SCr of 4.27 mg/dL (H)). Liver Function Tests: Recent Labs  Lab 01/31/20 1451 02/01/20 0440 02/02/20 0336 02/03/20 0326  AST  --  12*  --   --   ALT  --  12  --   --   ALKPHOS  --  75  --   --   BILITOT  --  0.9  --   --   PROT  --  6.6  --   --   ALBUMIN 2.9* 2.5* 2.3* 2.4*  No results for input(s): LIPASE, AMYLASE in the last 168 hours. No results for input(s): AMMONIA in the last 168 hours. Coagulation Profile: No results for input(s): INR, PROTIME in the last 168 hours. Cardiac Enzymes: Recent Labs  Lab 01/31/20 0236  CKTOTAL 38*   BNP (last 3 results) No results for input(s): PROBNP in the last 8760 hours. HbA1C: No results for input(s): HGBA1C in the last 72 hours. CBG: Recent Labs  Lab 02/01/20 2057 02/03/20 2115 02/03/20 2359 02/04/20 0630 02/04/20 0826  GLUCAP 78 100* 158* 117* 142*   Lipid Profile: No results for input(s): CHOL, HDL, LDLCALC, TRIG, CHOLHDL, LDLDIRECT in the last 72 hours. Thyroid Function Tests: No results for input(s): TSH, T4TOTAL, FREET4, T3FREE, THYROIDAB in the last 72 hours. Anemia Panel: Recent Labs    02/02/20 0336 02/02/20 0809  VITAMINB12  --  1,708*  FOLATE  --  8.1  FERRITIN  --  1,124*  TIBC  --  165*  IRON  --  78  RETICCTPCT 2.1  --    Urine analysis:    Component Value Date/Time   COLORURINE YELLOW 01/31/2020 0539   APPEARANCEUR CLOUDY (A) 01/31/2020 0539   LABSPEC 1.012 01/31/2020 0539   PHURINE 7.0 01/31/2020 0539   GLUCOSEU NEGATIVE 01/31/2020 0539   HGBUR LARGE  (A) 01/31/2020 0539   BILIRUBINUR NEGATIVE 01/31/2020 [place] 01/31/2020 0539   PROTEINUR 100 (A) 01/31/2020 0539   NITRITE NEGATIVE 01/31/2020 0539   LEUKOCYTESUR LARGE (A) 01/31/2020 0539   Sepsis Labs: '@LABRCNTIP'$ (procalcitonin:4,lacticidven:4)  ) Recent Results (from the past 240 hour(s))  SARS Coronavirus 2 by RT PCR (hospital order, performed in [place] hospital lab) Nasopharyngeal Nasopharyngeal Swab     Status: None   Collection Time: 01/31/20  3:45 AM   Specimen: Nasopharyngeal Swab  Result Value Ref Range Status   SARS Coronavirus 2 NEGATIVE NEGATIVE Final    Comment: (NOTE) SARS-CoV-2 target nucleic acids are NOT DETECTED.  The SARS-CoV-2 RNA is generally detectable in upper and lower respiratory specimens during the acute phase of infection. The lowest concentration of SARS-CoV-2 viral copies this assay can detect is 250 copies / mL. A negative result does not preclude SARS-CoV-2 infection and should not be used as the sole basis for treatment or other patient management decisions.  A negative result may occur with improper specimen collection / handling, submission of specimen other than nasopharyngeal swab, presence of viral mutation(s) within the areas targeted by this assay, and inadequate number of viral copies (<250 copies / mL). A negative result must be combined with clinical observations, patient history, and epidemiological information.  Fact Sheet for Patients:   StrictlyIdeas.no  Fact Sheet for Healthcare Providers: BankingDealers.co.za  This test is not yet approved or  cleared by the [place] FDA and has been authorized for detection and/or diagnosis of SARS-CoV-2 by FDA under an Emergency Use Authorization (EUA).  This EUA will remain in effect (meaning this test can be used) for the duration of the COVID-19 declaration under Section 564(b)(1) of the Act, 21 U.S.C. section  360bbb-3(b)(1), unless the authorization is terminated or revoked sooner.  Performed at [facility], [place] [address], [place], [place] [address]   Urine culture     Status: Abnormal   Collection Time: 01/31/20  6:33 AM   Specimen: Urine, Random  Result Value Ref Range Status   Specimen Description URINE, RANDOM  Final   Special Requests   Final    NONE Performed at [facility], [place] [address] [place],  [address]    Culture MULTIPLE SPECIES PRESENT, SUGGEST RECOLLECTION (A)  Final   Report Status 02/01/2020 FINAL  Final    Radiology Studies: No results found.      Scheduled Meds: . Chlorhexidine Gluconate Cloth  6 each Topical Daily  . diphenoxylate-atropine  1 tablet Oral QID  . feeding supplement (PROSource TF)  45 mL Per Tube TID  . free water  300 mL Per Tube 2 times per day  . heparin injection (subcutaneous)  5,000 Units Subcutaneous Q8H  . liver oil-zinc oxide   Topical BID  . loperamide  2 mg Oral BID  . metoprolol tartrate  25 mg Oral BID  . multivitamin with minerals  1 tablet Oral Daily  . ondansetron (ZOFRAN) IV  4 mg Intravenous TID AC  . sodium bicarbonate  1,300 mg Oral BID  . vitamin C  250 mg Oral BID   Continuous Infusions: . feeding supplement (OSMOLITE 1.5 CAL) 780 mL (02/03/20 2007)  . magnesium sulfate bolus IVPB       LOS: 4 days    Time spent: 67mn  PDomenic Polite MD [facility]  02/04/2020, 11:42 AM

## 2020-02-04 NOTE — Progress Notes (Signed)
[facility] NEPHROLOGY PROGRESS NOTE  Assessment/ Plan: Pt is a 65 y.o. yo male  with history of hypertension, PAD, smoker, admitted to [facility] in 09/2019 for AAA surgery complicated by embolization of aortic thrombus leading to left above-knee amputation, AKI requiring CRRT, leading to CKD with baseline creatinine level around 3, had cardiac arrest, pericardial effusion requiring pericardial drain placement, ischemic bowel taken to the OR emergently for small bowel resection, bladder obstruction requiring Foley catheter insertion which was removed by urologist on 1/20, living in rehab facility.  The patient was unable to pass urine 6h after removal of Foley catheter therefore he was sent to the ER. In the ER he was found to have potassium level of 6.8, elevated BUN and creatinine level almost 11. Patient was transferred from [facility], ER to [facility] for further evaluation.   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 This episode was likely ischemic ATN due to severe dehydration, high ileostomy output and hypotension.  Kidney ultrasound ruled out obstruction however has CKD. He improved with IVF which were discontinued yesterday.  Creatinine further improved to 4.2 today.  Strict ins and out, daily lab. Watch for continued renal recovery.    #Catheter related UTI: s/p therapy.   #Metabolic acidosis: Cont oral sodium bicarbonate.   #Hypotension: BP improved with IV fluid.   Subjective: Seen and examined at bedside.  Denies nausea vomiting chest pain shortness of breath. Trying to maintain PO intake.  Urine output 1.125 L, net I/Os 0.25 / 1.875 (inc ostomy).  No new event.  Objective Vital signs in last 24 hours: Vitals:   02/03/20 2000 02/03/20 2354 02/04/20 0500 02/04/20 0630  BP: 120/70 105/69 137/70   Pulse: 70 66 66   Resp: '16 17 16   '$ Temp: 98.6 F (37 C) 98.8 F (37.1 C) 98 F (36.7 C)   TempSrc: Oral Oral Oral   SpO2: 100% 100% 100%   Weight:    53.5 kg   Height:       Weight change: 0 kg  Intake/Output Summary (Last 24 hours) at 02/04/2020 0825 Last data filed at 02/04/2020 0600 Gross per 24 hour  Intake 120 ml  Output 775 ml  Net -655 ml       Labs: Basic Metabolic Panel: Recent Labs  Lab 02/02/20 0336 02/03/20 0326 02/03/20 1653 02/04/20 0455  NA 136 138  --  138  K 4.7 4.5  --  4.2  CL 108 110  --  107  CO2 18* 18*  --  22  GLUCOSE 80 89  --  136*  BUN 84* 64*  --  55*  CREATININE 6.90* 5.23*  --  4.27*  CALCIUM 9.1 9.1  --  9.3  PHOS 4.7* 3.8 3.2 2.9   Liver Function Tests: Recent Labs  Lab 02/01/20 0440 02/02/20 0336 02/03/20 0326  AST 12*  --   --   ALT 12  --   --   ALKPHOS 75  --   --   BILITOT 0.9  --   --   PROT 6.6  --   --   ALBUMIN 2.5* 2.3* 2.4*   No results for input(s): LIPASE, AMYLASE in the last 168 hours. No results for input(s): AMMONIA in the last 168 hours. CBC: Recent Labs  Lab 01/31/20 0236 02/01/20 0440 02/02/20 0336 02/03/20 0326 02/04/20 0455  WBC 8.8 7.4 6.5 8.1 7.9  NEUTROABS 6.7  --   --   --   --  HGB 10.2* 8.4* 7.9* 8.5* 8.5*  HCT 33.4* 27.2* 25.9* 28.0* 27.4*  MCV 89.1 88.6 89.0 89.5 89.5  PLT 238 218 204 212 187   Cardiac Enzymes: Recent Labs  Lab 01/31/20 0236  CKTOTAL 38*   CBG: Recent Labs  Lab 01/31/20 1113 02/01/20 2057 02/03/20 2115 02/03/20 2359 02/04/20 0630  GLUCAP 67* 78 100* 158* 117*    Iron Studies:  Recent Labs    02/02/20 0809  IRON 78  TIBC 165*  FERRITIN 1,124*   Studies/Results: No results found.  Medications: Infusions: . feeding supplement (OSMOLITE 1.5 CAL) 780 mL (02/03/20 2007)    Scheduled Medications: . Chlorhexidine Gluconate Cloth  6 each Topical Daily  . diphenoxylate-atropine  1 tablet Oral QID  . feeding supplement (PROSource TF)  45 mL Per Tube TID  . heparin injection (subcutaneous)  5,000 Units Subcutaneous Q8H  . liver oil-zinc oxide   Topical BID  . metoprolol tartrate  25 mg Oral BID  .  multivitamin with minerals  1 tablet Oral Daily  . sodium bicarbonate  1,300 mg Oral BID  . vitamin C  250 mg Oral BID    have reviewed scheduled and prn medications.  Physical Exam: General:NAD, comfortable  ENT:  Mouth dry Heart:RRR, s1s2 nl Lungs:clear b/l, no crackle Abdomen:soft, Non-tender, ileostomy bag present Extremities:No edema Neurology: Alert awake and following commands GU: foley in place  Ria Comment A [place] 02/04/2020,8:25 AM  LOS: 4 days

## 2020-02-05 LAB — RENAL FUNCTION PANEL
Albumin: 2.4 g/dL — ABNORMAL LOW (ref 3.5–5.0)
Anion gap: 7 (ref 5–15)
BUN: 45 mg/dL — ABNORMAL HIGH (ref 8–23)
CO2: 24 mmol/L (ref 22–32)
Calcium: 9.8 mg/dL (ref 8.9–10.3)
Chloride: 105 mmol/L (ref 98–111)
Creatinine, Ser: 3.6 mg/dL — ABNORMAL HIGH (ref 0.61–1.24)
GFR, Estimated: 18 mL/min — ABNORMAL LOW (ref >60)
Glucose, Bld: 134 mg/dL — ABNORMAL HIGH (ref 70–99)
Phosphorus: 2.1 mg/dL — ABNORMAL LOW (ref 2.5–4.6)
Potassium: 3.8 mmol/L (ref 3.5–5.1)
Sodium: 136 mmol/L (ref 135–145)

## 2020-02-05 LAB — GLUCOSE, CAPILLARY
Glucose-Capillary: 106 mg/dL — ABNORMAL HIGH (ref 70–99)
Glucose-Capillary: 107 mg/dL — ABNORMAL HIGH (ref 70–99)
Glucose-Capillary: 113 mg/dL — ABNORMAL HIGH (ref 70–99)
Glucose-Capillary: 115 mg/dL — ABNORMAL HIGH (ref 70–99)
Glucose-Capillary: 127 mg/dL — ABNORMAL HIGH (ref 70–99)
Glucose-Capillary: 131 mg/dL — ABNORMAL HIGH (ref 70–99)

## 2020-02-05 MED ORDER — PROSOURCE TF PO LIQD: 45.0000 mL | Freq: Three times a day (TID) | ORAL | Status: DC

## 2020-02-05 MED ORDER — OSMOLITE 1.5 CAL PO LIQD: 780.0000 mL | ORAL | 0 refills | Status: DC

## 2020-02-05 MED ORDER — SODIUM BICARBONATE 650 MG PO TABS: 1300.0000 mg | ORAL_TABLET | Freq: Two times a day (BID) | ORAL | Status: AC

## 2020-02-05 MED ORDER — FREE WATER: 300.0000 mL | Status: DC

## 2020-02-05 MED ORDER — OXYCODONE HCL 5 MG/5ML PO SOLN: 5.0000 mg | ORAL | 0 refills | Status: DC | PRN

## 2020-02-05 NOTE — Progress Notes (Signed)
Curtis Solis  Assessment/ Plan: Pt is a 65 y.o. 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  The patient was unable to pass urine 6h after removal of Foley catheter therefore he was sent to the ER. In the ER he was found to have potassium level of 6.8, elevated BUN and creatinine level almost 11. Patient was transferred from [facility], ER to [facility] for further evaluation.   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 This episode was likely ischemic ATN due to severe dehydration, high ileostomy output and hypotension.  Kidney ultrasound ruled out obstruction however has CKD. He improved with IVF which were discontinued on 1/24.  Antimotility agents rx'd to slow ostomy outpt. Creatinine further improved to 3.6 today.  Strict ins and out, daily lab. Encourage oral intake to keep up with ostomy losses. Watch for continued renal recovery.     #Catheter related UTI: s/p therapy.  # urinary retention: d/c'd hospital with foley - had TOV with urology and removed but then developed inability to urinate.  Foley reinserted and will need outpt f/u with urology.   #Metabolic acidosis: Cont oral sodium bicarbonate.   #Hypotension: BP improved with IV fluid.   Subjective: Seen and examined at bedside.  Denies nausea vomiting chest pain shortness of breath. Trying to maintain PO intake - seems appetite improving.  Urine output 0.575 L, net I/Os 1.9 / 1.875 (inc ostomy 1300).  No new  event. Discussed with RN bedside too.  Objective Vital signs in last 24 hours: Vitals:   02/04/20 1736 02/04/20 2038 02/05/20 0412 02/05/20 0412  BP:  126/80  105/62  Pulse:  71  (!) 56  Resp:  15  17  Temp: 98.2 F (36.8 C) 98.3 F (36.8 C)  97.8 F (36.6 C)  TempSrc: Oral Oral  Oral  SpO2: 100% 100%  100%  Weight:   54.4 kg   Height:       Weight change: 0.907 kg  Intake/Output Summary (Last 24 hours) at 02/05/2020 0932 Last data filed at 02/05/2020 0600 Gross per 24 hour  Intake 1200 ml  Output 1875 ml  Net -675 ml       Labs: Basic Metabolic Panel: Recent Labs  Lab 02/03/20 0326 02/03/20 1653 02/04/20 0455 02/04/20 1637 02/05/20 0322  NA 138  --  138  --  136  K 4.5  --  4.2  --  3.8  CL 110  --  107  --  105  CO2 18*  --  22  --  24  GLUCOSE 89  --  136*  --  134*  BUN 64*  --  55*  --  45*  CREATININE 5.23*  --  4.27*  --  3.60*  CALCIUM 9.1  --  9.3  --  9.8  PHOS 3.8   < > 2.9 2.1* 2.1*   < > = values in this interval not displayed.   Liver Function Tests: Recent Labs  Lab 02/01/20 0440 02/02/20 0336 02/03/20 0326 02/05/20 0322  AST 12*  --   --   --  ALT 12  --   --   --   ALKPHOS 75  --   --   --   BILITOT 0.9  --   --   --   PROT 6.6  --   --   --   ALBUMIN 2.5* 2.3* 2.4* 2.4*   No results for input(s): LIPASE, AMYLASE in the last 168 hours. No results for input(s): AMMONIA in the last 168 hours. CBC: Recent Labs  Lab 01/31/20 0236 02/01/20 0440 02/02/20 0336 02/03/20 0326 02/04/20 0455  WBC 8.8 7.4 6.5 8.1 7.9  NEUTROABS 6.7  --   --   --   --   HGB 10.2* 8.4* 7.9* 8.5* 8.5*  HCT 33.4* 27.2* 25.9* 28.0* 27.4*  MCV 89.1 88.6 89.0 89.5 89.5  PLT 238 218 204 212 187   Cardiac Enzymes: Recent Labs  Lab 01/31/20 0236  CKTOTAL 38*   CBG: Recent Labs  Lab 02/04/20 0826 02/04/20 2040 02/05/20 0058 02/05/20 0414 02/05/20 0727  GLUCAP 142* 102* 113* 131* 127*    Iron Studies:  No results for input(s): IRON, TIBC,  TRANSFERRIN, FERRITIN in the last 72 hours. Studies/Results: No results found.  Medications: Infusions: . feeding supplement (OSMOLITE 1.5 CAL) Stopped (02/05/20 0841)  . magnesium sulfate bolus IVPB 2 g (02/04/20 1719)    Scheduled Medications: . Chlorhexidine Gluconate Cloth  6 each Topical Daily  . diphenoxylate-atropine  1 tablet Oral QID  . feeding supplement (PROSource TF)  45 mL Per Tube TID  . free water  300 mL Per Tube 2 times per day  . heparin injection (subcutaneous)  5,000 Units Subcutaneous Q8H  . liver oil-zinc oxide   Topical BID  . loperamide  2 mg Oral BID  . metoprolol tartrate  25 mg Oral BID  . multivitamin with minerals  1 tablet Oral Daily  . ondansetron (ZOFRAN) IV  4 mg Intravenous TID AC  . sodium bicarbonate  1,300 mg Oral BID  . vitamin C  250 mg Oral BID    have reviewed scheduled and prn medications.  Physical Exam: General:NAD, comfortable  ENT:  Mouth dry Heart:RRR, s1s2 nl Lungs:clear b/l, no crackle Abdomen:soft, Non-tender, ileostomy bag present Extremities:No edema Neurology: Alert awake and following commands GU: foley in place  Ria Comment A Moni Rothrock 02/05/2020,9:32 AM  LOS: 5 days

## 2020-02-05 NOTE — Discharge Summary (Deleted)
Physician Discharge Summary  Curtis Solis [id number] DOB: December 16, 1955 DOA: 01/31/2020  PCP: [name], MD  Admit date: 01/31/2020 Discharge date: 02/06/2020  Admitted From: Snf(Home, ALF, ILF, SNF) Disposition:  Snf   Recommendations for Outpatient Follow-up:  1. Follow up with PCP in 1-2 weeks 2. Please obtain BMP/CBC in one week   Home Health:no Equipment/Devices:none  Discharge Condition: Stable CODE STATUS: DNR Diet recommendation: Heart Healthy  Brief/Interim Summary:  65 y.o. male past medical history significant for PAD, tobacco use admitted to [facility] in mid September 2021 for elective repair of his abdominal aortic aneurysm these led to numerous complications including embolization to the left lower extremity eventually requiring above-the-knee amputation. Gated by acute kidney injury rhabdomyolysis requiring CRRT on 09/28/2019 started intermittent dialysis on 10/03/2019. With a cardiac arrest due to tamponade requiring a pericardial drain on 10/22/2019. Eventually required emergent surgery due to bowel ischemia and underwent small bowel resection on 10/22/2019, went back to the OR for an ileostomy on 10/27/2019. On 10/29/2019 had a Foley placed as he developed necrosis of the foreskin of the penis with flossing required a blunt dissection to place Foley catheter. Extubated on 11/03/2019. HD tunneled catheter placed on 11/11/2019. He then had gradual improvement in his kidney function and transition to hemodialysis his creatinine has been ranging around 3 at the time of discharge. Discharge to [facility] on 11/25/2019 with Foley. As he slowly improved transfer from [facility] to [facility] skilled nursing facility in December. Seen by urology on 01/30/2020 had a Foley catheter removed after passing voiding trial. At the skilled nursing facility was unable to void and sent to the ED on 01/31/2020 he was found to have a BUN of 108 with a creatinine of 10.9 potassium is 6.4  renal ultrasound showed no hydronephrosis had a Foley catheter placed and only 200 cc of urine came out he was also noted to have necrotic changes in his right foot.   Discharge Diagnoses:  Principal Problem:   Acute kidney failure ([place]) Active Problems:   Hyperkalemia   Uremia   Necrotic toes (HCC)   Colostomy present (HCC)   Open abdominal wall wound, subsequent encounter   Open wound of penis   Acute lower UTI   Pressure injury of skin   Protein-calorie malnutrition, severe  Acute kidney failure (HCC) on chronic kidney disease stage IV/hyperkalemia: With a baseline creatinine around 3 on admission found to have a creatinine of 11 with hyperkalemia. Question prerenal due to high output ileostomy, renal ultrasound showed no hydronephrosis. Foley was placed in the ED, started on Lokelma and aggressive IV fluid hydration.  His creatinine improved, this morning his creatinine is 3.6. Close to baseline. He continues to have significant amount of output through his ostomy he was started on Lomotil and Imodium twice a day. Nephrology was consulted upon admission and commended conservative management. Physical therapy evaluated the patient and recommended skilled nursing facility.  Necrotic toe with wound serosanguineous: Palpable dorsalis pedis and posterior tibial pulses present. Vascular surgery was consulted recommended to keep wounds dry and toes separated ultimately he will require transmetatarsal amputation as an outpatient in the future.. Vascular surgery recommended to follow-up with [facility] as an outpatient. Continue wound care recommendations.  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 His output has improved.  Abdominal wall wound/penile wound: Underwent small resection with ileostomy. Continue wound  care.  History of  atrial fibrillation: Diagnosed during recent hospitalization now in sinus rhythm not on anticoagulation on discharge summary, probably not a candidate due to cardiac tamponade. We will follow up with cardiology at [facility] as an outpatient.  Catheter associated UTI: Foley was removed prior to admission UA abnormal he received 3 days of cefepime.  Urine culture was nonconclusive. Antibiotics has been discontinued. Follow-up with urology as an outpatient.    Protein-calorie malnutrition, severe Continue tube feedings.  Pressure injury of skin RN Pressure Injury Documentation: Pressure Injury 01/31/20 Buttocks Bilateral Stage 2 -  Partial thickness loss of dermis presenting as a shallow open injury with a red, pink wound bed without slough. (Active)  01/31/20 1600  Location: Buttocks  Location Orientation: Bilateral  Staging: Stage 2 -  Partial thickness loss of dermis presenting as a shallow open injury with a red, pink wound bed without slough.  Wound Description (Comments):   Present on Admission: Yes     Discharge Instructions  Discharge Instructions    Diet - low sodium heart healthy   Complete by: As directed    Increase activity slowly   Complete by: As directed    No wound care   Complete by: As directed      Allergies as of 02/06/2020   No Known Allergies     Medication List    STOP taking these medications   QUEtiapine 25 MG tablet Commonly known as: SEROQUEL     TAKE these medications   atorvastatin 40 MG tablet Commonly known as: LIPITOR Take 40 mg by mouth daily.   calcium carbonate 500 MG chewable tablet Commonly known as: TUMS - dosed in mg elemental calcium Chew 1 tablet by mouth every 8 (eight) hours as needed for indigestion or heartburn.   diphenoxylate-atropine 2.5-0.025 MG tablet Commonly known as: LOMOTIL Take 1 tablet by mouth every 6 (six) hours. 6 AM, 12 noon, 6 PM and 12 AM   feeding supplement (OSMOLITE 1.5 CAL) Liqd Place 780  mLs into feeding tube continuous.   feeding supplement (PROSource TF) liquid Place 45 mLs into feeding tube 3 (three) times daily.   free water Soln Place 300 mLs into feeding tube every 4 (four) hours.   gabapentin 100 MG capsule Commonly known as: NEURONTIN Take 100 mg by mouth at bedtime.   hydrOXYzine 10 MG tablet Commonly known as: ATARAX/VISTARIL Take 10 mg by mouth every 6 (six) hours as needed for anxiety.   ipratropium-albuterol 0.5-2.5 (3) MG/3ML Soln Commonly known as: DUONEB Take 3 mLs by nebulization every 6 (six) hours as needed (wheezing/ cough).   loperamide 2 MG tablet Commonly known as: IMODIUM A-D Take 2 mg by mouth 4 (four) times daily as needed for diarrhea or loose stools.   metoprolol tartrate 25 MG tablet Commonly known as: LOPRESSOR Take 12.5 mg by mouth 2 (two) times daily. 9 AM and 9 PM   oxyCODONE 5 MG/5ML solution Commonly known as: ROXICODONE Take 5 mLs (5 mg total) by mouth every 4 (four) hours as needed (pain). What changed: medication strength   pantoprazole 20 MG tablet Commonly known as: PROTONIX Take 20 mg by mouth daily.   simethicone 80 MG chewable tablet Commonly known as: MYLICON Chew 80 mg by mouth every 8 (eight) hours as needed for flatulence.   sodium bicarbonate 650 MG tablet Take 2 tablets (1,300 mg total) by mouth 2 (two) times daily. What changed:   how much to take  when to take this  traMADol 50 MG tablet Commonly known as: ULTRAM Take 50 mg by mouth every 8 (eight) hours. 6 AM, 2 PM, 10 PM       Follow-up Information    [name], MD.   Specialty: Internal Medicine Contact information: [id number] [place] [address] [phone]              No Known Allergies  Consultations:  Neurology  Vascular surgery  Nephrology     Procedures/Studies: US RENAL  Result Date: 01/31/2020 CLINICAL DATA:  Initial evaluation for a KI, evaluate for obstructive  uropathy/urinary retention. EXAM: RENAL / URINARY TRACT ULTRASOUND COMPLETE COMPARISON:  None available. FINDINGS: Right Kidney: Renal measurements: 10.6 x 4.6 x 5.2 cm = volume: 134 mL. Mildly increased echogenicity within the renal parenchyma. No nephrolithiasis or hydronephrosis. 3.2 cm simple cyst present at the upper pole. Left Kidney: Renal measurements: 11.5 x 4.5 x 4.4 cm = volume: 117 mL. Increased echogenicity within the renal parenchyma. No nephrolithiasis or hydronephrosis. 2.3 cm benign appearing cyst present at the upper pole. Additional 2.6 cm simple cyst present at the lower pole. Bladder: Diffuse irregularity of the bladder wall is seen. While this could reflect trabeculation related incomplete distension, a possible focal bladder mass is difficult to exclude (image 56). Prevoid volume measures 181 cc. Bilateral ureteral jets are visualized. Other: None. IMPRESSION: 1. Increased echogenicity within the renal parenchyma, consistent with medical renal disease. 2. No hydronephrosis. 3. Multifocal irregularity of the bladder wall. While this finding may reflect trabeculation related to incomplete distension, a possible focal bladder mass is difficult to exclude based on this appearance. Further assessment with dedicated cross-sectional imaging/CT urogram suggested for further evaluation. 4. Few scattered benign appearing renal cysts as above. Electronically Signed   By: Jeannine Boga M.D.   On: 01/31/2020 06:05   DG Foot Complete Right  Result Date: 01/31/2020 CLINICAL DATA:  Necrosis EXAM: RIGHT FOOT COMPLETE - 3+ VIEW COMPARISON:  None. FINDINGS: Hallux valgus. Degenerative change in the first MTP joint. Prior bunionectomy. Negative for fracture. No acute skeletal abnormality. Mild calcaneal spurring at the Achilles insertion. IMPRESSION: No acute abnormality. Electronically Signed   By: Franchot Gallo M.D.   On: 01/31/2020 02:47   (Echo, Carotid, EGD, Colonoscopy, ERCP)     Subjective: No new complaints.  Discharge Exam: Vitals:   02/05/20 1925 02/06/20 0441  BP: (!) 123/59 101/64  Pulse: (!) 55 (!) 55  Resp: 14 15  Temp: 98.6 F (37 C) 98.3 F (36.8 C)  SpO2: 100% 100%   Vitals:   02/05/20 1043 02/05/20 1925 02/06/20 0006 02/06/20 0441  BP: 113/75 (!) 123/59  101/64  Pulse: 60 (!) 55  (!) 55  Resp: '17 14  15  '$ Temp: 98.5 F (36.9 C) 98.6 F (37 C)  98.3 F (36.8 C)  TempSrc: Oral Oral  Oral  SpO2: 100% 100%  100%  Weight:   49.4 kg   Height:        General: Pt is alert, awake, not in acute distress Cardiovascular: RRR, S1/S2 +, no rubs, no gallops Respiratory: CTA bilaterally, no wheezing, no rhonchi Abdominal: Soft, NT, ND, bowel sounds + Extremities: no edema, no cyanosis    The results of significant diagnostics from this hospitalization (including imaging, microbiology, ancillary and laboratory) are listed below for reference.     Microbiology: Recent Results (from the past 240 hour(s))  SARS Coronavirus 2 by RT PCR (hospital order, performed in [facility] hospital lab) Nasopharyngeal Nasopharyngeal  Swab     Status: None   Collection Time: 01/31/20  3:45 AM   Specimen: Nasopharyngeal Swab  Result Value Ref Range Status   SARS Coronavirus 2 NEGATIVE NEGATIVE Final    Comment: (NOTE) SARS-CoV-2 target nucleic acids are NOT DETECTED.  The SARS-CoV-2 RNA is generally detectable in upper and lower respiratory specimens during the acute phase of infection. The lowest concentration of SARS-CoV-2 viral copies this assay can detect is 250 copies / mL. A negative result does not preclude SARS-CoV-2 infection and should not be used as the sole basis for treatment or other patient management decisions.  A negative result may occur with improper specimen collection / handling, submission of specimen other than nasopharyngeal swab, presence of viral mutation(s) within the areas targeted by this assay, and inadequate number of viral  copies (<250 copies / mL). A negative result must be combined with clinical observations, patient history, and epidemiological information.  Fact Sheet for Patients:   StrictlyIdeas.no  Fact Sheet for Healthcare Providers: BankingDealers.co.za  This test is not yet approved or  cleared by the [place] FDA and has been authorized for detection and/or diagnosis of SARS-CoV-2 by FDA under an Emergency Use Authorization (EUA).  This EUA will remain in effect (meaning this test can be used) for the duration of the COVID-19 declaration under Section 564(b)(1) of the Act, 21 U.S.C. section 360bbb-3(b)(1), unless the authorization is terminated or revoked sooner.  Performed at [facility], [place] [address] [place], [place] [address]   Urine culture     Status: Abnormal   Collection Time: 01/31/20  6:33 AM   Specimen: Urine, Random  Result Value Ref Range Status   Specimen Description URINE, RANDOM  Final   Special Requests   Final    NONE Performed at [facility], [place] [address] [place], [place] [address]    Culture MULTIPLE SPECIES PRESENT, SUGGEST RECOLLECTION (A)  Final   Report Status 02/01/2020 FINAL  Final  SARS CORONAVIRUS 2 (TAT 6-24 HRS) Nasopharyngeal Nasopharyngeal Swab     Status: None   Collection Time: 02/05/20  7:39 PM   Specimen: Nasopharyngeal Swab  Result Value Ref Range Status   SARS Coronavirus 2 NEGATIVE NEGATIVE Final    Comment: (NOTE) SARS-CoV-2 target nucleic acids are NOT DETECTED.  The SARS-CoV-2 RNA is generally detectable in upper and lower respiratory specimens during the acute phase of infection. Negative results do not preclude SARS-CoV-2 infection, do not rule out co-infections with other pathogens, and should not be used as the sole basis for treatment or other patient management decisions. Negative results must be combined with clinical observations, patient history,  and epidemiological information. The expected result is Negative.  Fact Sheet for Patients: SugarRoll.be  Fact Sheet for Healthcare Providers: https://www.woods-mathews.com/  This test is not yet approved or cleared by the [place] FDA and  has been authorized for detection and/or diagnosis of SARS-CoV-2 by FDA under an Emergency Use Authorization (EUA). This EUA will remain  in effect (meaning this test can be used) for the duration of the COVID-19 declaration under Se ction 564(b)(1) of the Act, 21 U.S.C. section 360bbb-3(b)(1), unless the authorization is terminated or revoked sooner.  Performed at [facility], [place] [address], [place], [place] [address]      Labs: BNP (last 3 results) No results for input(s): BNP in the last 8760 hours. Basic Metabolic Panel: Recent Labs  Lab 02/01/20 0440 02/02/20 0336 02/03/20 0326 02/03/20 1653 02/04/20  0455 02/04/20 1637 02/05/20 0322  NA 134* 136 138  --  138  --  136  K 5.1 4.7 4.5  --  4.2  --  3.8  CL 102 108 110  --  107  --  105  CO2 18* 18* 18*  --  22  --  24  GLUCOSE 69* 80 89  --  136*  --  134*  BUN 105* 84* 64*  --  55*  --  45*  CREATININE 8.95* 6.90* 5.23*  --  4.27*  --  3.60*  CALCIUM 9.3 9.1 9.1  --  9.3  --  9.8  MG  --   --   --  1.1* 1.1* 1.0*  --   PHOS  --  4.7* 3.8 3.2 2.9 2.1* 2.1*   Liver Function Tests: Recent Labs  Lab 01/31/20 1451 02/01/20 0440 02/02/20 0336 02/03/20 0326 02/05/20 0322  AST  --  12*  --   --   --   ALT  --  12  --   --   --   ALKPHOS  --  75  --   --   --   BILITOT  --  0.9  --   --   --   PROT  --  6.6  --   --   --   ALBUMIN 2.9* 2.5* 2.3* 2.4* 2.4*   No results for input(s): LIPASE, AMYLASE in the last 168 hours. No results for input(s): AMMONIA in the last 168 hours. CBC: Recent Labs  Lab 01/31/20 0236 02/01/20 0440 02/02/20 0336 02/03/20 0326 02/04/20 0455  WBC 8.8 7.4 6.5 8.1 7.9  NEUTROABS 6.7  --   --    --   --   HGB 10.2* 8.4* 7.9* 8.5* 8.5*  HCT 33.4* 27.2* 25.9* 28.0* 27.4*  MCV 89.1 88.6 89.0 89.5 89.5  PLT 238 218 204 212 187   Cardiac Enzymes: Recent Labs  Lab 01/31/20 0236  CKTOTAL 38*   BNP: Invalid input(s): POCBNP CBG: Recent Labs  Lab 02/05/20 1606 02/05/20 2016 02/06/20 0005 02/06/20 0442 02/06/20 0726  GLUCAP 107* 106* 119* 117* 121*   D-Dimer No results for input(s): DDIMER in the last 72 hours. Hgb A1c No results for input(s): HGBA1C in the last 72 hours. Lipid Profile No results for input(s): CHOL, HDL, LDLCALC, TRIG, CHOLHDL, LDLDIRECT in the last 72 hours. Thyroid function studies No results for input(s): TSH, T4TOTAL, T3FREE, THYROIDAB in the last 72 hours.  Invalid input(s): FREET3 Anemia work up No results for input(s): VITAMINB12, FOLATE, FERRITIN, TIBC, IRON, RETICCTPCT in the last 72 hours. Urinalysis    Component Value Date/Time   COLORURINE YELLOW 01/31/2020 0539   APPEARANCEUR CLOUDY (A) 01/31/2020 0539   LABSPEC 1.012 01/31/2020 0539   PHURINE 7.0 01/31/2020 0539   GLUCOSEU NEGATIVE 01/31/2020 0539   HGBUR LARGE (A) 01/31/2020 0539   BILIRUBINUR NEGATIVE 01/31/2020 0539   KETONESUR NEGATIVE 01/31/2020 0539   PROTEINUR 100 (A) 01/31/2020 0539   NITRITE NEGATIVE 01/31/2020 0539   LEUKOCYTESUR LARGE (A) 01/31/2020 0539   Sepsis Labs Invalid input(s): PROCALCITONIN,  WBC,  LACTICIDVEN Microbiology Recent Results (from the past 240 hour(s))  SARS Coronavirus 2 by RT PCR (hospital order, performed in [facility] hospital lab) Nasopharyngeal Nasopharyngeal Swab     Status: None   Collection Time: 01/31/20  3:45 AM   Specimen: Nasopharyngeal Swab  Result Value Ref Range Status   SARS Coronavirus 2 NEGATIVE NEGATIVE Final    Comment: (NOTE)  SARS-CoV-2 target nucleic acids are NOT DETECTED.  The SARS-CoV-2 RNA is generally detectable in upper and lower respiratory specimens during the acute phase of infection. The  lowest concentration of SARS-CoV-2 viral copies this assay can detect is 250 copies / mL. A negative result does not preclude SARS-CoV-2 infection and should not be used as the sole basis for treatment or other patient management decisions.  A negative result may occur with improper specimen collection / handling, submission of specimen other than nasopharyngeal swab, presence of viral mutation(s) within the areas targeted by this assay, and inadequate number of viral copies (<250 copies / mL). A negative result must be combined with clinical observations, patient history, and epidemiological information.  Fact Sheet for Patients:   StrictlyIdeas.no  Fact Sheet for Healthcare Providers: BankingDealers.co.za  This test is not yet approved or  cleared by the [place] FDA and has been authorized for detection and/or diagnosis of SARS-CoV-2 by FDA under an Emergency Use Authorization (EUA).  This EUA will remain in effect (meaning this test can be used) for the duration of the COVID-19 declaration under Section 564(b)(1) of the Act, 21 U.S.C. section 360bbb-3(b)(1), unless the authorization is terminated or revoked sooner.  Performed at [facility], [place] [address] [place], [place] [address]   Urine culture     Status: Abnormal   Collection Time: 01/31/20  6:33 AM   Specimen: Urine, Random  Result Value Ref Range Status   Specimen Description URINE, RANDOM  Final   Special Requests   Final    NONE Performed at [facility], [place] [address], [place], [place] [address]    Culture MULTIPLE SPECIES PRESENT, SUGGEST RECOLLECTION (A)  Final   Report Status 02/01/2020 FINAL  Final  SARS CORONAVIRUS 2 (TAT 6-24 HRS) Nasopharyngeal Nasopharyngeal Swab     Status: None   Collection Time: 02/05/20  7:39 PM   Specimen: Nasopharyngeal Swab  Result Value Ref Range Status   SARS Coronavirus 2 NEGATIVE NEGATIVE Final     Comment: (NOTE) SARS-CoV-2 target nucleic acids are NOT DETECTED.  The SARS-CoV-2 RNA is generally detectable in upper and lower respiratory specimens during the acute phase of infection. Negative results do not preclude SARS-CoV-2 infection, do not rule out co-infections with other pathogens, and should not be used as the sole basis for treatment or other patient management decisions. Negative results must be combined with clinical observations, patient history, and epidemiological information. The expected result is Negative.  Fact Sheet for Patients: SugarRoll.be  Fact Sheet for Healthcare Providers: https://www.woods-mathews.com/  This test is not yet approved or cleared by the [place] FDA and  has been authorized for detection and/or diagnosis of SARS-CoV-2 by FDA under an Emergency Use Authorization (EUA). This EUA will remain  in effect (meaning this test can be used) for the duration of the COVID-19 declaration under Se ction 564(b)(1) of the Act, 21 U.S.C. section 360bbb-3(b)(1), unless the authorization is terminated or revoked sooner.  Performed at [facility], [place] [address], [place], [place] [address]      Time coordinating discharge: Over 30 minutes  SIGNED:   Charlynne Cousins, MD  [facility] 02/06/2020, 7:55 AM Pager   If 7PM-7AM, please contact night-coverage [email or web address] Password [email or web address]

## 2020-02-05 NOTE — Progress Notes (Signed)
[facility] PROGRESS NOTE    Progress Note  Curtis Solis  [id number] DOB: Sep 26, 1955 DOA: 01/31/2020 PCP: [name], MD     Brief Narrative:   Curtis Solis is an 65 y.o. male past medical history significant for PAD, tobacco use admitted to [facility] in mid September 2021 for elective repair of his abdominal aortic aneurysm these led to numerous complications including embolization to the left lower extremity eventually requiring above-the-knee amputation. Gated by acute kidney injury rhabdomyolysis requiring CRRT on 09/28/2019 started intermittent dialysis on 10/03/2019. With a cardiac arrest due to tamponade requiring a pericardial drain on 10/22/2019. Eventually required emergent surgery due to bowel ischemia and underwent small bowel resection on 10/22/2019, went back to the OR for an ileostomy on 10/27/2019. On 10/29/2019 had a Foley placed as he developed necrosis of the foreskin of the penis with flossing required a blunt dissection to place Foley catheter. Extubated on 11/03/2019. HD tunneled catheter placed on 11/11/2019. He then had gradual improvement in his kidney function and transition to hemodialysis his creatinine has been ranging around 3 at the time of discharge. Discharge to [facility] on 11/25/2019 with Foley. As he slowly improved transfer from [facility] to [facility] skilled nursing facility in December. Seen by urology on 01/30/2020 had a Foley catheter removed after passing voiding trial. At the skilled nursing facility was unable to void and sent to the ED on 01/31/2020 he was found to have a BUN of 108 with a creatinine of 10.9 potassium is 6.4 renal ultrasound showed no hydronephrosis had a Foley catheter placed and only 200 cc of urine came out he was also noted to have necrotic changes in his right foot.   Assessment/Plan:   Acute kidney failure (HCC) on chronic kidney disease stage IV/hyperkalemia: With a baseline creatinine around 3 on  admission found to have a creatinine of 11 with hyperkalemia. Question prerenal due to high output ileostomy, renal ultrasound showed no hydronephrosis. Foley was placed in the ED, started on Lokelma and aggressive IV fluid hydration.  His creatinine improved, this morning his creatinine is 3.6. Close to baseline. He continues to have significant amount of output through his ostomy he was started on Lomotil and Imodium twice a day. Nephrology was consulted upon admission and commended conservative management. TOC has been consulted for skilled nursing facility placement.  Necrotic toe with wound serosanguineous: Palpable dorsalis pedis and posterior tibial pulses present. Vascular surgery was consulted recommended to keep wounds dry and toes separated ultimately he will require transmetatarsal amputation as an outpatient. Vascular surgery recommended to follow-up with [facility] as an outpatient. Continue wound care recommendations.  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  Abdominal wall wound/penile wound: Underwent small resection with ileostomy. Continue wound care.  History of atrial fibrillation: Diagnosed during recent hospitalization now in sinus rhythm not on anticoagulation on discharge summary, probably not a candidate due to cardiac tamponade. We will follow up with cardiology at [facility] as an outpatient.  Catheter associated UTI: Foley was removed prior to admission UA abnormal he received 3 days of cefepime.  Urine culture was nonconclusive. Antibiotics has been discontinued. Follow-up with urology as an outpatient.    Protein-calorie malnutrition, severe Continue tube feedings.  Pressure injury of skin RN Pressure Injury Documentation: Pressure Injury 01/31/20 Buttocks Bilateral Stage 2 -  Partial thickness loss of  dermis  presenting as a shallow open injury with a red, pink wound bed without slough. (Active)  01/31/20 1600  Location: Buttocks  Location Orientation: Bilateral  Staging: Stage 2 -  Partial thickness loss of dermis presenting as a shallow open injury with a red, pink wound bed without slough.  Wound Description (Comments):   Present on Admission: Yes    Estimated body mass index is 16.74 kg/m as calculated from the following:   Height as of this encounter: '5\' 11"'$  (1.803 m).   Weight as of this encounter: 54.4 kg. Malnutrition Type:  Nutrition Problem: Severe Malnutrition Etiology: chronic illness (ischemic bowel)   DVT prophylaxis: lovenox Family Communication:wife Status is: Inpatient  Remains inpatient appropriate because:Hemodynamically unstable   Dispo: The patient is from: SNF              Anticipated d/c is to: SNF              Anticipated d/c date is: > 3 days              Patient currently is not medically stable to d/c.   Difficult to place patient No        Code Status:     Code Status Orders  (From admission, onward)         Start     Ordered   01/31/20 0605  Do not attempt resuscitation (DNR)  Continuous       Question Answer Comment  In the event of cardiac or respiratory ARREST Do not call a "code blue"   In the event of cardiac or respiratory ARREST Do not perform Intubation, CPR, defibrillation or ACLS   In the event of cardiac or respiratory ARREST Use medication by any route, position, wound care, and other measures to relive pain and suffering. May use oxygen, suction and manual treatment of airway obstruction as needed for comfort.   Comments Confirmed with [name]      01/31/20 [id number]        Code Status History    This patient has a current code status but no historical code status.   Advance Care Planning Activity        IV Access:    Peripheral IV   Procedures and diagnostic studies:   No results found.   Medical  Consultants:    None.  Anti-Infectives:   none  Subjective:    Curtis Solis relates he feels much better compared to yesterday.  Objective:    Vitals:   02/04/20 1736 02/04/20 2038 02/05/20 0412 02/05/20 0412  BP:  126/80  105/62  Pulse:  71  (!) 56  Resp:  15  17  Temp: 98.2 F (36.8 C) 98.3 F (36.8 C)  97.8 F (36.6 C)  TempSrc: Oral Oral  Oral  SpO2: 100% 100%  100%  Weight:   54.4 kg   Height:       SpO2: 100 %   Intake/Output Summary (Last 24 hours) at 02/05/2020 0731 Last data filed at 02/05/2020 0600 Gross per 24 hour  Intake 1972.42 ml  Output 1875 ml  Net 97.42 ml   Filed Weights   02/03/20 0000 02/04/20 0630 02/05/20 0412  Weight: 53.5 kg 53.5 kg 54.4 kg    Exam: General exam: In no acute distress. Respiratory system: Good air movement and clear to auscultation. Cardiovascular system: S1 & S2 heard, RRR. No JVD. Gastrointestinal system: Abdomen is nondistended, soft and nontender.  Extremities: No pedal edema. Skin: Dressing on  the right foot. Psychiatry: Judgement and insight appear normal. Mood & affect appropriate.    Data Reviewed:    Labs: Basic Metabolic Panel: Recent Labs  Lab 02/01/20 0440 02/02/20 0336 02/03/20 0326 02/03/20 1653 02/04/20 0455 02/04/20 1637 02/05/20 0322  NA 134* 136 138  --  138  --  136  K 5.1 4.7 4.5  --  4.2  --  3.8  CL 102 108 110  --  107  --  105  CO2 18* 18* 18*  --  22  --  24  GLUCOSE 69* 80 89  --  136*  --  134*  BUN 105* 84* 64*  --  55*  --  45*  CREATININE 8.95* 6.90* 5.23*  --  4.27*  --  3.60*  CALCIUM 9.3 9.1 9.1  --  9.3  --  9.8  MG  --   --   --  1.1* 1.1* 1.0*  --   PHOS  --  4.7* 3.8 3.2 2.9 2.1* 2.1*   GFR Estimated Creatinine Clearance: 16.4 mL/min (A) (by C-G formula based on SCr of 3.6 mg/dL (H)). Liver Function Tests: Recent Labs  Lab 01/31/20 1451 02/01/20 0440 02/02/20 0336 02/03/20 0326 02/05/20 0322  AST  --  12*  --   --   --   ALT  --  12  --   --   --    ALKPHOS  --  75  --   --   --   BILITOT  --  0.9  --   --   --   PROT  --  6.6  --   --   --   ALBUMIN 2.9* 2.5* 2.3* 2.4* 2.4*   No results for input(s): LIPASE, AMYLASE in the last 168 hours. No results for input(s): AMMONIA in the last 168 hours. Coagulation profile No results for input(s): INR, PROTIME in the last 168 hours. COVID-19 Labs  Recent Labs    02/02/20 0809  FERRITIN 1,124*    Lab Results  Component Value Date   [place] NEGATIVE 01/31/2020    CBC: Recent Labs  Lab 01/31/20 0236 02/01/20 0440 02/02/20 0336 02/03/20 0326 02/04/20 0455  WBC 8.8 7.4 6.5 8.1 7.9  NEUTROABS 6.7  --   --   --   --   HGB 10.2* 8.4* 7.9* 8.5* 8.5*  HCT 33.4* 27.2* 25.9* 28.0* 27.4*  MCV 89.1 88.6 89.0 89.5 89.5  PLT 238 218 204 212 187   Cardiac Enzymes: Recent Labs  Lab 01/31/20 0236  CKTOTAL 38*   BNP (last 3 results) No results for input(s): PROBNP in the last 8760 hours. CBG: Recent Labs  Lab 02/04/20 0826 02/04/20 2040 02/05/20 0058 02/05/20 0414 02/05/20 0727  GLUCAP 142* 102* 113* 131* 127*   D-Dimer: No results for input(s): DDIMER in the last 72 hours. Hgb A1c: No results for input(s): HGBA1C in the last 72 hours. Lipid Profile: No results for input(s): CHOL, HDL, LDLCALC, TRIG, CHOLHDL, LDLDIRECT in the last 72 hours. Thyroid function studies: No results for input(s): TSH, T4TOTAL, T3FREE, THYROIDAB in the last 72 hours.  Invalid input(s): FREET3 Anemia work up: Recent Labs    02/02/20 0809  VITAMINB12 1,708*  FOLATE 8.1  FERRITIN 1,124*  TIBC 165*  IRON 78   Sepsis Labs: Recent Labs  Lab 02/01/20 0440 02/02/20 0336 02/03/20 0326 02/04/20 0455  WBC 7.4 6.5 8.1 7.9   Microbiology Recent Results (from the past 240 hour(s))  SARS Coronavirus 2 by  RT PCR (hospital order, performed in [facility] hospital lab) Nasopharyngeal Nasopharyngeal Swab     Status: None   Collection Time: 01/31/20  3:45 AM   Specimen: Nasopharyngeal Swab   Result Value Ref Range Status   SARS Coronavirus 2 NEGATIVE NEGATIVE Final    Comment: (NOTE) SARS-CoV-2 target nucleic acids are NOT DETECTED.  The SARS-CoV-2 RNA is generally detectable in upper and lower respiratory specimens during the acute phase of infection. The lowest concentration of SARS-CoV-2 viral copies this assay can detect is 250 copies / mL. A negative result does not preclude SARS-CoV-2 infection and should not be used as the sole basis for treatment or other patient management decisions.  A negative result may occur with improper specimen collection / handling, submission of specimen other than nasopharyngeal swab, presence of viral mutation(s) within the areas targeted by this assay, and inadequate number of viral copies (<250 copies / mL). A negative result must be combined with clinical observations, patient history, and epidemiological information.  Fact Sheet for Patients:   StrictlyIdeas.no  Fact Sheet for Healthcare Providers: BankingDealers.co.za  This test is not yet approved or  cleared by the [place] FDA and has been authorized for detection and/or diagnosis of SARS-CoV-2 by FDA under an Emergency Use Authorization (EUA).  This EUA will remain in effect (meaning this test can be used) for the duration of the COVID-19 declaration under Section 564(b)(1) of the Act, 21 U.S.C. section 360bbb-3(b)(1), unless the authorization is terminated or revoked sooner.  Performed at [facility], [place] [address], [place], [place] [address]   Urine culture     Status: Abnormal   Collection Time: 01/31/20  6:33 AM   Specimen: Urine, Random  Result Value Ref Range Status   Specimen Description URINE, RANDOM  Final   Special Requests   Final    NONE Performed at [facility], [place] [address], [place], [place] [address]    Culture MULTIPLE SPECIES PRESENT, SUGGEST RECOLLECTION (A)  Final    Report Status 02/01/2020 FINAL  Final     Medications:   . Chlorhexidine Gluconate Cloth  6 each Topical Daily  . diphenoxylate-atropine  1 tablet Oral QID  . feeding supplement (PROSource TF)  45 mL Per Tube TID  . free water  300 mL Per Tube 2 times per day  . heparin injection (subcutaneous)  5,000 Units Subcutaneous Q8H  . liver oil-zinc oxide   Topical BID  . loperamide  2 mg Oral BID  . metoprolol tartrate  25 mg Oral BID  . multivitamin with minerals  1 tablet Oral Daily  . ondansetron (ZOFRAN) IV  4 mg Intravenous TID AC  . sodium bicarbonate  1,300 mg Oral BID  . vitamin C  250 mg Oral BID   Continuous Infusions: . feeding supplement (OSMOLITE 1.5 CAL) 780 mL (02/04/20 2220)  . magnesium sulfate bolus IVPB 2 g (02/04/20 1719)      LOS: 5 days   Charlynne Cousins  [facility]  02/05/2020, 7:31 AM

## 2020-02-05 NOTE — TOC Progression Note (Signed)
Transition of Care [facility]) - Progression Note    Patient Details  Name: Curtis Solis MRN: [id number] Date of Birth: 1956-01-07  Transition of Care [facility]) CM/SW [place], [place] Phone Number: 02/05/2020, 12:24 PM  Clinical Narrative:     CSW called pt's son Linton Rump and updated on d/c plans. CSW unable to find different facility; pt will [facility]. Son request to speak with MD; CSW notified MD. CSW called [facility]; they thought auth waiver was still in effect but when calling insurance informed it is not. Pt will need to receive auth prior to d/c. Plan for DC tomorrow.   Expected Discharge Plan: [place] Barriers to Discharge: Continued Medical Work up  Expected Discharge Plan and Services Expected Discharge Plan: [facility] arrangements for the past 2 months: [place] Expected Discharge Date: 02/05/20                                     Social Determinants of Health (SDOH) Interventions    Readmission Risk Interventions No flowsheet data found.

## 2020-02-06 DIAGNOSIS — E43 Unspecified severe protein-calorie malnutrition: Secondary | ICD-10-CM

## 2020-02-06 LAB — GLUCOSE, CAPILLARY
Glucose-Capillary: 117 mg/dL — ABNORMAL HIGH (ref 70–99)
Glucose-Capillary: 117 mg/dL — ABNORMAL HIGH (ref 70–99)
Glucose-Capillary: 119 mg/dL — ABNORMAL HIGH (ref 70–99)
Glucose-Capillary: 121 mg/dL — ABNORMAL HIGH (ref 70–99)
Glucose-Capillary: 76 mg/dL (ref 70–99)

## 2020-02-06 LAB — SARS CORONAVIRUS 2 (TAT 6-24 HRS): SARS Coronavirus 2: NEGATIVE

## 2020-02-06 NOTE — Evaluation (Signed)
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 Ultimately went to [facility], then SNF. Now admitted to [facility] due to acute kidney failure. PMH Afib, CKD, HLD, HTN, PVD, AAA s/p repair, AKA  Clinical Impression   Patient received in bed, eager to return to SNF and continue rehab. Able to get to EOB with min guard and HOB elevated, however once up on edge his R toe started bleeding copiously and we had to return to bed without attempting transfers, RN alerted. Strength in RLE and core 3- to 3+/5 grossly. Left in bed positioned to comfort with all needs met, nursing staff aware of patient status. Recommend return to SNF moving forward.     Follow Up Recommendations SNF;Supervision/Assistance - 24 hour    Equipment Recommendations  Wheelchair (measurements PT);Wheelchair cushion (measurements PT);3in1 (PT);Other (comment) (sliding board, drop arm commode)    Recommendations for Other Services       Precautions / Restrictions Precautions Precautions: Fall;Other (comment) Precaution Comments: abdominal wound, skin graft site R LE, L AKA, PEG and ostomy, painful/ischemic R toes Restrictions Weight Bearing Restrictions: No      Mobility  Bed Mobility Overal bed mobility: Needs Assistance Bed Mobility: Rolling;Supine to Sit;Sit to Supine Rolling: Min guard   Supine to sit: Min guard;HOB elevated Sit to supine: Mod assist;+2 for physical assistance;HOB elevated   General bed mobility comments: able to roll side to side well with min guard/use of rail; able to get to EOB with use of rail but  had more difficulty getting back to bed due to R LE pain    Transfers                 General transfer comment: deferred- R toe strated bleeding copiously when RLE in dependent position  Ambulation/Gait             General Gait Details: unable  Stairs            Wheelchair Mobility    Modified Rankin (Stroke Patients Only)       Balance Overall balance assessment: Needs assistance Sitting-balance support: Bilateral upper extremity supported Sitting balance-Leahy Scale: Poor Sitting balance - Comments: defers to BUE support, mild posterior lean when UE support removed Postural control: Posterior lean                                   Pertinent Vitals/Pain Pain Assessment: Faces Faces Pain Scale: Hurts whole lot Pain Location: R foot Pain Descriptors / Indicators: Sharp;Discomfort Pain Intervention(s): Limited activity within patient's tolerance;Monitored during session;Repositioned    Home Living Family/patient expects to be discharged to:: Skilled nursing facility                 Additional Comments: [facility] Cor[facility]    Prior Function Level of Independence: Needs assistance   Gait / Transfers Assistance Needed: needed up to Mod assist for lateral scoot transfers per his report  ADL's / Homemaking Assistance Needed: able to wash himself but not able to dress himself just yet due to multiple lines  Hand Dominance        Extremity/Trunk Assessment   Upper Extremity Assessment Upper Extremity Assessment: Defer to OT evaluation;Generalized weakness    Lower Extremity Assessment Lower Extremity Assessment: Generalized weakness (RLE grossly 3+ to 3-/5)    Cervical / Trunk Assessment Cervical / Trunk Assessment: Kyphotic  Communication   Communication: No difficulties  Cognition Arousal/Alertness: Awake/alert Behavior During Therapy: WFL for tasks assessed/performed Overall Cognitive Status: Within Functional Limits  for tasks assessed                                        General Comments      Exercises     Assessment/Plan    PT Assessment Patient needs continued PT services  PT Problem List Decreased strength;Decreased knowledge of use of DME;Decreased activity tolerance;Decreased safety awareness;Decreased balance;Decreased skin integrity;Pain;Decreased mobility;Decreased coordination       PT Treatment Interventions DME instruction;Balance training;Functional mobility training;Patient/family education;Wheelchair mobility training;Therapeutic activities;Therapeutic exercise;Manual techniques    PT Goals (Current goals can be found in the Care Plan section)  Acute Rehab PT Goals Patient Stated Goal: back to [facility] and continue rehab PT Goal Formulation: With patient Time For Goal Achievement: 02/20/20 Potential to Achieve Goals: Good    Frequency Min 2X/week   Barriers to discharge        Co-evaluation               AM-PAC PT "6 Clicks" Mobility  Outcome Measure Help needed turning from your back to your side while in a flat bed without using bedrails?: A Little Help needed moving from lying on your back to sitting on the side of a flat bed without using bedrails?: A Lot Help needed moving to and from a bed to a chair (including a wheelchair)?: A Lot Help needed standing up from a chair using your arms (e.g., wheelchair or bedside chair)?: Total Help needed to walk in hospital room?: Total Help needed climbing 3-5 steps with a railing? : Total 6 Click Score: 10    End of Session   Activity Tolerance: Patient tolerated treatment well;Other (comment) (session limited due to R toe bleeding copiously in dependent position) Patient left: in bed;with call bell/phone within reach Nurse Communication: Mobility status;Other (comment) (R toe bleeding) PT Visit Diagnosis: Other abnormalities of gait and mobility (R26.89);Muscle weakness (generalized)  (M62.81);Pain Pain - Right/Left: Right Pain - part of body: Leg    Time: 9355-2174 PT Time Calculation (min) (ACUTE ONLY): 23 min   Charges:   PT Evaluation $PT Eval High Complexity: 1 High PT Treatments $Therapeutic Activity: 8-22 mins        Windell Norfolk, DPT, PN1   Supplemental Physical Therapist [place]    Pager [phone] Acute Rehab Office [phone]

## 2020-02-06 NOTE — Discharge Summary (Signed)
Physician Discharge Summary  Delore Menner [id number] DOB: May 26, 1955 DOA: 01/31/2020  PCP: [name], MD  Admit date: 01/31/2020 Discharge date: 02/06/2020  Admitted From: Snf(Home, ALF, ILF, SNF) Disposition:  Snf   Recommendations for Outpatient Follow-up:  1. Follow up with PCP in 1-2 weeks 2. Please obtain BMP/CBC in one week   Home Health:no Equipment/Devices:none  Discharge Condition: Stable CODE STATUS: DNR Diet recommendation: Heart Healthy  Brief/Interim Summary:  65 y.o. male past medical history significant for PAD, tobacco use admitted to [facility] in mid September 2021 for elective repair of his abdominal aortic aneurysm these led to numerous complications including embolization to the left lower extremity eventually requiring above-the-knee amputation. Gated by acute kidney injury rhabdomyolysis requiring CRRT on 09/28/2019 started intermittent dialysis on 10/03/2019. With a cardiac arrest due to tamponade requiring a pericardial drain on 10/22/2019. Eventually required emergent surgery due to bowel ischemia and underwent small bowel resection on 10/22/2019, went back to the OR for an ileostomy on 10/27/2019. On 10/29/2019 had a Foley placed as he developed necrosis of the foreskin of the penis with flossing required a blunt dissection to place Foley catheter. Extubated on 11/03/2019. HD tunneled catheter placed on 11/11/2019. He then had gradual improvement in his kidney function and transition to hemodialysis his creatinine has been ranging around 3 at the time of discharge. Discharge to [facility] on 11/25/2019 with Foley. As he slowly improved transfer from [facility] to [facility] skilled nursing facility in December. Seen by urology on 01/30/2020 had a Foley catheter removed after passing voiding trial. At the skilled nursing facility was unable to void and sent to the ED on 01/31/2020 he was found to have a BUN of 108 with a creatinine of 10.9 potassium is 6.4  renal ultrasound showed no hydronephrosis had a Foley catheter placed and only 200 cc of urine came out he was also noted to have necrotic changes in his right foot.   Discharge Diagnoses:  Principal Problem:   Acute kidney failure ([place]) Active Problems:   Hyperkalemia   Uremia   Necrotic toes (HCC)   Colostomy present (HCC)   Open abdominal wall wound, subsequent encounter   Open wound of penis   Acute lower UTI   Pressure injury of skin   Protein-calorie malnutrition, severe  Acute kidney failure (HCC) on chronic kidney disease stage IV/hyperkalemia: With a baseline creatinine around 3 on admission found to have a creatinine of 11 with hyperkalemia. Question prerenal due to high output ileostomy, renal ultrasound showed no hydronephrosis. Foley was placed in the ED, started on Lokelma and aggressive IV fluid hydration.  His creatinine improved, this morning his creatinine is 3.6. Close to baseline. He continues to have significant amount of output through his ostomy he was started on Lomotil and Imodium twice a day. Nephrology was consulted upon admission and commended conservative management. Physical therapy evaluated the patient and recommended skilled nursing facility.  Necrotic toe with wound serosanguineous: Palpable dorsalis pedis and posterior tibial pulses present. Vascular surgery was consulted recommended to keep wounds dry and toes separated ultimately he will require transmetatarsal amputation as an outpatient in the future.. Vascular surgery recommended to follow-up with [facility] as an outpatient. Continue wound care recommendations.  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 His output has improved.  Abdominal wall wound/penile wound: Underwent small resection with ileostomy. Continue wound  care.  History of  atrial fibrillation: Diagnosed during recent hospitalization now in sinus rhythm not on anticoagulation on discharge summary, probably not a candidate due to cardiac tamponade. We will follow up with cardiology at [facility] as an outpatient.  Catheter associated UTI: Foley was removed prior to admission UA abnormal he received 3 days of cefepime.  Urine culture was nonconclusive. Antibiotics has been discontinued. Follow-up with urology as an outpatient.    Protein-calorie malnutrition, severe Continue tube feedings.  Pressure injury of skin RN Pressure Injury Documentation: Pressure Injury 01/31/20 Buttocks Bilateral Stage 2 -  Partial thickness loss of dermis presenting as a shallow open injury with a red, pink wound bed without slough. (Active)  01/31/20 1600  Location: Buttocks  Location Orientation: Bilateral  Staging: Stage 2 -  Partial thickness loss of dermis presenting as a shallow open injury with a red, pink wound bed without slough.  Wound Description (Comments):   Present on Admission: Yes     Discharge Instructions  Discharge Instructions    Diet - low sodium heart healthy   Complete by: As directed    Increase activity slowly   Complete by: As directed    No wound care   Complete by: As directed      Allergies as of 02/06/2020   No Known Allergies     Medication List    STOP taking these medications   QUEtiapine 25 MG tablet Commonly known as: SEROQUEL     TAKE these medications   atorvastatin 40 MG tablet Commonly known as: LIPITOR Take 40 mg by mouth daily.   calcium carbonate 500 MG chewable tablet Commonly known as: TUMS - dosed in mg elemental calcium Chew 1 tablet by mouth every 8 (eight) hours as needed for indigestion or heartburn.   diphenoxylate-atropine 2.5-0.025 MG tablet Commonly known as: LOMOTIL Take 1 tablet by mouth every 6 (six) hours. 6 AM, 12 noon, 6 PM and 12 AM   feeding supplement (OSMOLITE 1.5 CAL) Liqd Place 780  mLs into feeding tube continuous.   feeding supplement (PROSource TF) liquid Place 45 mLs into feeding tube 3 (three) times daily.   free water Soln Place 300 mLs into feeding tube every 4 (four) hours.   gabapentin 100 MG capsule Commonly known as: NEURONTIN Take 100 mg by mouth at bedtime.   hydrOXYzine 10 MG tablet Commonly known as: ATARAX/VISTARIL Take 10 mg by mouth every 6 (six) hours as needed for anxiety.   ipratropium-albuterol 0.5-2.5 (3) MG/3ML Soln Commonly known as: DUONEB Take 3 mLs by nebulization every 6 (six) hours as needed (wheezing/ cough).   loperamide 2 MG tablet Commonly known as: IMODIUM A-D Take 2 mg by mouth 4 (four) times daily as needed for diarrhea or loose stools.   metoprolol tartrate 25 MG tablet Commonly known as: LOPRESSOR Take 12.5 mg by mouth 2 (two) times daily. 9 AM and 9 PM   oxyCODONE 5 MG/5ML solution Commonly known as: ROXICODONE Take 5 mLs (5 mg total) by mouth every 4 (four) hours as needed (pain). What changed: medication strength   pantoprazole 20 MG tablet Commonly known as: PROTONIX Take 20 mg by mouth daily.   simethicone 80 MG chewable tablet Commonly known as: MYLICON Chew 80 mg by mouth every 8 (eight) hours as needed for flatulence.   sodium bicarbonate 650 MG tablet Take 2 tablets (1,300 mg total) by mouth 2 (two) times daily. What changed:   how much to take  when to take this  traMADol 50 MG tablet Commonly known as: ULTRAM Take 50 mg by mouth every 8 (eight) hours. 6 AM, 2 PM, 10 PM       Follow-up Information    [name], MD.   Specialty: Internal Medicine Contact information: [id number] [place] [address] [phone]              No Known Allergies  Consultations:  Neurology  Vascular surgery  Nephrology     Procedures/Studies: US RENAL  Result Date: 01/31/2020 CLINICAL DATA:  Initial evaluation for a KI, evaluate for obstructive  uropathy/urinary retention. EXAM: RENAL / URINARY TRACT ULTRASOUND COMPLETE COMPARISON:  None available. FINDINGS: Right Kidney: Renal measurements: 10.6 x 4.6 x 5.2 cm = volume: 134 mL. Mildly increased echogenicity within the renal parenchyma. No nephrolithiasis or hydronephrosis. 3.2 cm simple cyst present at the upper pole. Left Kidney: Renal measurements: 11.5 x 4.5 x 4.4 cm = volume: 117 mL. Increased echogenicity within the renal parenchyma. No nephrolithiasis or hydronephrosis. 2.3 cm benign appearing cyst present at the upper pole. Additional 2.6 cm simple cyst present at the lower pole. Bladder: Diffuse irregularity of the bladder wall is seen. While this could reflect trabeculation related incomplete distension, a possible focal bladder mass is difficult to exclude (image 56). Prevoid volume measures 181 cc. Bilateral ureteral jets are visualized. Other: None. IMPRESSION: 1. Increased echogenicity within the renal parenchyma, consistent with medical renal disease. 2. No hydronephrosis. 3. Multifocal irregularity of the bladder wall. While this finding may reflect trabeculation related to incomplete distension, a possible focal bladder mass is difficult to exclude based on this appearance. Further assessment with dedicated cross-sectional imaging/CT urogram suggested for further evaluation. 4. Few scattered benign appearing renal cysts as above. Electronically Signed   By: Jeannine Boga M.D.   On: 01/31/2020 06:05   DG Foot Complete Right  Result Date: 01/31/2020 CLINICAL DATA:  Necrosis EXAM: RIGHT FOOT COMPLETE - 3+ VIEW COMPARISON:  None. FINDINGS: Hallux valgus. Degenerative change in the first MTP joint. Prior bunionectomy. Negative for fracture. No acute skeletal abnormality. Mild calcaneal spurring at the Achilles insertion. IMPRESSION: No acute abnormality. Electronically Signed   By: Franchot Gallo M.D.   On: 01/31/2020 02:47   (Echo, Carotid, EGD, Colonoscopy, ERCP)     Subjective: No new complaints.  Discharge Exam: Vitals:   02/05/20 1925 02/06/20 0441  BP: (!) 123/59 101/64  Pulse: (!) 55 (!) 55  Resp: 14 15  Temp: 98.6 F (37 C) 98.3 F (36.8 C)  SpO2: 100% 100%   Vitals:   02/05/20 1043 02/05/20 1925 02/06/20 0006 02/06/20 0441  BP: 113/75 (!) 123/59  101/64  Pulse: 60 (!) 55  (!) 55  Resp: '17 14  15  '$ Temp: 98.5 F (36.9 C) 98.6 F (37 C)  98.3 F (36.8 C)  TempSrc: Oral Oral  Oral  SpO2: 100% 100%  100%  Weight:   49.4 kg   Height:        General: Pt is alert, awake, not in acute distress Cardiovascular: RRR, S1/S2 +, no rubs, no gallops Respiratory: CTA bilaterally, no wheezing, no rhonchi Abdominal: Soft, NT, ND, bowel sounds + Extremities: no edema, no cyanosis    The results of significant diagnostics from this hospitalization (including imaging, microbiology, ancillary and laboratory) are listed below for reference.     Microbiology: Recent Results (from the past 240 hour(s))  SARS Coronavirus 2 by RT PCR (hospital order, performed in [facility] hospital lab) Nasopharyngeal Nasopharyngeal  Swab     Status: None   Collection Time: 01/31/20  3:45 AM   Specimen: Nasopharyngeal Swab  Result Value Ref Range Status   SARS Coronavirus 2 NEGATIVE NEGATIVE Final    Comment: (NOTE) SARS-CoV-2 target nucleic acids are NOT DETECTED.  The SARS-CoV-2 RNA is generally detectable in upper and lower respiratory specimens during the acute phase of infection. The lowest concentration of SARS-CoV-2 viral copies this assay can detect is 250 copies / mL. A negative result does not preclude SARS-CoV-2 infection and should not be used as the sole basis for treatment or other patient management decisions.  A negative result may occur with improper specimen collection / handling, submission of specimen other than nasopharyngeal swab, presence of viral mutation(s) within the areas targeted by this assay, and inadequate number of viral  copies (<250 copies / mL). A negative result must be combined with clinical observations, patient history, and epidemiological information.  Fact Sheet for Patients:   StrictlyIdeas.no  Fact Sheet for Healthcare Providers: BankingDealers.co.za  This test is not yet approved or  cleared by the [place] FDA and has been authorized for detection and/or diagnosis of SARS-CoV-2 by FDA under an Emergency Use Authorization (EUA).  This EUA will remain in effect (meaning this test can be used) for the duration of the COVID-19 declaration under Section 564(b)(1) of the Act, 21 U.S.C. section 360bbb-3(b)(1), unless the authorization is terminated or revoked sooner.  Performed at [facility], [place] [address], [place], [place] [address]   Urine culture     Status: Abnormal   Collection Time: 01/31/20  6:33 AM   Specimen: Urine, Random  Result Value Ref Range Status   Specimen Description URINE, RANDOM  Final   Special Requests   Final    NONE Performed at [facility], [place] [address] [place], [place] [address]    Culture MULTIPLE SPECIES PRESENT, SUGGEST RECOLLECTION (A)  Final   Report Status 02/01/2020 FINAL  Final  SARS CORONAVIRUS 2 (TAT 6-24 HRS) Nasopharyngeal Nasopharyngeal Swab     Status: None   Collection Time: 02/05/20  7:39 PM   Specimen: Nasopharyngeal Swab  Result Value Ref Range Status   SARS Coronavirus 2 NEGATIVE NEGATIVE Final    Comment: (NOTE) SARS-CoV-2 target nucleic acids are NOT DETECTED.  The SARS-CoV-2 RNA is generally detectable in upper and lower respiratory specimens during the acute phase of infection. Negative results do not preclude SARS-CoV-2 infection, do not rule out co-infections with other pathogens, and should not be used as the sole basis for treatment or other patient management decisions. Negative results must be combined with clinical observations, patient history,  and epidemiological information. The expected result is Negative.  Fact Sheet for Patients: SugarRoll.be  Fact Sheet for Healthcare Providers: https://www.woods-mathews.com/  This test is not yet approved or cleared by the [place] FDA and  has been authorized for detection and/or diagnosis of SARS-CoV-2 by FDA under an Emergency Use Authorization (EUA). This EUA will remain  in effect (meaning this test can be used) for the duration of the COVID-19 declaration under Se ction 564(b)(1) of the Act, 21 U.S.C. section 360bbb-3(b)(1), unless the authorization is terminated or revoked sooner.  Performed at [facility], [place] [address] [place], [place] [address]      Labs: BNP (last 3 results) No results for input(s): BNP in the last 8760 hours. Basic Metabolic Panel: Recent Labs  Lab 02/01/20 0440 02/02/20 0336 02/03/20 0326 02/03/20 1653 02/04/20  0455 02/04/20 1637 02/05/20 0322  NA 134* 136 138  --  138  --  136  K 5.1 4.7 4.5  --  4.2  --  3.8  CL 102 108 110  --  107  --  105  CO2 18* 18* 18*  --  22  --  24  GLUCOSE 69* 80 89  --  136*  --  134*  BUN 105* 84* 64*  --  55*  --  45*  CREATININE 8.95* 6.90* 5.23*  --  4.27*  --  3.60*  CALCIUM 9.3 9.1 9.1  --  9.3  --  9.8  MG  --   --   --  1.1* 1.1* 1.0*  --   PHOS  --  4.7* 3.8 3.2 2.9 2.1* 2.1*   Liver Function Tests: Recent Labs  Lab 01/31/20 1451 02/01/20 0440 02/02/20 0336 02/03/20 0326 02/05/20 0322  AST  --  12*  --   --   --   ALT  --  12  --   --   --   ALKPHOS  --  75  --   --   --   BILITOT  --  0.9  --   --   --   PROT  --  6.6  --   --   --   ALBUMIN 2.9* 2.5* 2.3* 2.4* 2.4*   No results for input(s): LIPASE, AMYLASE in the last 168 hours. No results for input(s): AMMONIA in the last 168 hours. CBC: Recent Labs  Lab 01/31/20 0236 02/01/20 0440 02/02/20 0336 02/03/20 0326 02/04/20 0455  WBC 8.8 7.4 6.5 8.1 7.9  NEUTROABS 6.7  --   --    --   --   HGB 10.2* 8.4* 7.9* 8.5* 8.5*  HCT 33.4* 27.2* 25.9* 28.0* 27.4*  MCV 89.1 88.6 89.0 89.5 89.5  PLT 238 218 204 212 187   Cardiac Enzymes: Recent Labs  Lab 01/31/20 0236  CKTOTAL 38*   BNP: Invalid input(s): POCBNP CBG: Recent Labs  Lab 02/05/20 1606 02/05/20 2016 02/06/20 0005 02/06/20 0442 02/06/20 0726  GLUCAP 107* 106* 119* 117* 121*   D-Dimer No results for input(s): DDIMER in the last 72 hours. Hgb A1c No results for input(s): HGBA1C in the last 72 hours. Lipid Profile No results for input(s): CHOL, HDL, LDLCALC, TRIG, CHOLHDL, LDLDIRECT in the last 72 hours. Thyroid function studies No results for input(s): TSH, T4TOTAL, T3FREE, THYROIDAB in the last 72 hours.  Invalid input(s): FREET3 Anemia work up No results for input(s): VITAMINB12, FOLATE, FERRITIN, TIBC, IRON, RETICCTPCT in the last 72 hours. Urinalysis    Component Value Date/Time   COLORURINE YELLOW 01/31/2020 0539   APPEARANCEUR CLOUDY (A) 01/31/2020 0539   LABSPEC 1.012 01/31/2020 0539   PHURINE 7.0 01/31/2020 0539   GLUCOSEU NEGATIVE 01/31/2020 0539   HGBUR LARGE (A) 01/31/2020 0539   BILIRUBINUR NEGATIVE 01/31/2020 0539   KETONESUR NEGATIVE 01/31/2020 0539   PROTEINUR 100 (A) 01/31/2020 0539   NITRITE NEGATIVE 01/31/2020 0539   LEUKOCYTESUR LARGE (A) 01/31/2020 0539   Sepsis Labs Invalid input(s): PROCALCITONIN,  WBC,  LACTICIDVEN Microbiology Recent Results (from the past 240 hour(s))  SARS Coronavirus 2 by RT PCR (hospital order, performed in [facility] hospital lab) Nasopharyngeal Nasopharyngeal Swab     Status: None   Collection Time: 01/31/20  3:45 AM   Specimen: Nasopharyngeal Swab  Result Value Ref Range Status   SARS Coronavirus 2 NEGATIVE NEGATIVE Final    Comment: (NOTE)  SARS-CoV-2 target nucleic acids are NOT DETECTED.  The SARS-CoV-2 RNA is generally detectable in upper and lower respiratory specimens during the acute phase of infection. The  lowest concentration of SARS-CoV-2 viral copies this assay can detect is 250 copies / mL. A negative result does not preclude SARS-CoV-2 infection and should not be used as the sole basis for treatment or other patient management decisions.  A negative result may occur with improper specimen collection / handling, submission of specimen other than nasopharyngeal swab, presence of viral mutation(s) within the areas targeted by this assay, and inadequate number of viral copies (<250 copies / mL). A negative result must be combined with clinical observations, patient history, and epidemiological information.  Fact Sheet for Patients:   StrictlyIdeas.no  Fact Sheet for Healthcare Providers: BankingDealers.co.za  This test is not yet approved or  cleared by the [place] FDA and has been authorized for detection and/or diagnosis of SARS-CoV-2 by FDA under an Emergency Use Authorization (EUA).  This EUA will remain in effect (meaning this test can be used) for the duration of the COVID-19 declaration under Section 564(b)(1) of the Act, 21 U.S.C. section 360bbb-3(b)(1), unless the authorization is terminated or revoked sooner.  Performed at [facility], [place] [address], [place], [place] [address]   Urine culture     Status: Abnormal   Collection Time: 01/31/20  6:33 AM   Specimen: Urine, Random  Result Value Ref Range Status   Specimen Description URINE, RANDOM  Final   Special Requests   Final    NONE Performed at [facility], [place] [address] [place],  [address]    Culture MULTIPLE SPECIES PRESENT, SUGGEST RECOLLECTION (A)  Final   Report Status 02/01/2020 FINAL  Final  SARS CORONAVIRUS 2 (TAT 6-24 HRS) Nasopharyngeal Nasopharyngeal Swab     Status: None   Collection Time: 02/05/20  7:39 PM   Specimen: Nasopharyngeal Swab  Result Value Ref Range Status   SARS Coronavirus 2 NEGATIVE NEGATIVE Final     Comment: (NOTE) SARS-CoV-2 target nucleic acids are NOT DETECTED.  The SARS-CoV-2 RNA is generally detectable in upper and lower respiratory specimens during the acute phase of infection. Negative results do not preclude SARS-CoV-2 infection, do not rule out co-infections with other pathogens, and should not be used as the sole basis for treatment or other patient management decisions. Negative results must be combined with clinical observations, patient history, and epidemiological information. The expected result is Negative.  Fact Sheet for Patients: SugarRoll.be  Fact Sheet for Healthcare Providers: https://www.woods-mathews.com/  This test is not yet approved or cleared by the [place] FDA and  has been authorized for detection and/or diagnosis of SARS-CoV-2 by FDA under an Emergency Use Authorization (EUA). This EUA will remain  in effect (meaning this test can be used) for the duration of the COVID-19 declaration under Se ction 564(b)(1) of the Act, 21 U.S.C. section 360bbb-3(b)(1), unless the authorization is terminated or revoked sooner.  Performed at [facility], [place] [address], [place],  [address]      Time coordinating discharge: Over 30 minutes  SIGNED:   Charlynne Cousins, MD  [facility] 02/06/2020, 7:58 AM Pager   If 7PM-7AM, please contact night-coverage [email or web address] Password [email or web address]

## 2020-02-06 NOTE — Progress Notes (Signed)
[facility] NEPHROLOGY PROGRESS NOTE  Assessment/ Plan: Pt is a 65 y.o. 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  The patient was unable to pass urine 6h after removal of Foley catheter therefore he was sent to the ER. In the ER he was found to have potassium level of 6.8, elevated BUN and creatinine level almost 11. Patient was transferred from [facility], ER to [facility] for further evaluation.   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 This episode was likely ischemic ATN due to severe dehydration, high ileostomy output and hypotension.  Kidney ultrasound ruled out obstruction however has CKD. He improved with IVF which were discontinued on 1/24.  Antimotility agents rx'd to slow ostomy outpt. Creatinine further improved to 3.6 yesterday, no labs today. Encourage oral intake to keep up with ostomy losses. Watch for continued renal recovery.     #Catheter related UTI: s/p therapy.  # urinary retention: d/c'd hospital with foley - had TOV with urology and removed but then developed inability to urinate.  Foley reinserted and will need outpt f/u with urology.   #Metabolic acidosis: Cont oral sodium bicarbonate.   OK to d/c, I'll see him in clinic in about 2-4 weeks.  Labs are being checked in 1-2 weeks, can be sent to my office for review - fax [phone]   Subjective: Seen and examined at bedside.  Denies nausea vomiting chest pain shortness of breath. Trying to maintain PO intake - seems  appetite improving.  Urine output 0.845 L, net I/Os 1.9 / 2.9 (inc ostomy 2100).  D/c today.  Objective Vital signs in last 24 hours: Vitals:   02/05/20 1925 02/06/20 0006 02/06/20 0441 02/06/20 1043  BP: (!) 123/59  101/64 111/68  Pulse: (!) 55  (!) 55 65  Resp: '14  15 16  '$ Temp: 98.6 F (37 C)  98.3 F (36.8 C) 98.3 F (36.8 C)  TempSrc: Oral  Oral Oral  SpO2: 100%  100% 100%  Weight:  49.4 kg    Height:       Weight change: -4.99 kg  Intake/Output Summary (Last 24 hours) at 02/06/2020 1155 Last data filed at 02/06/2020 0918 Gross per 24 hour  Intake 974 ml  Output 2450 ml  Net -1476 ml       Labs: Basic Metabolic Panel: Recent Labs  Lab 02/03/20 0326 02/03/20 1653 02/04/20 0455 02/04/20 1637 02/05/20 0322  NA 138  --  138  --  136  K 4.5  --  4.2  --  3.8  CL 110  --  107  --  105  CO2 18*  --  22  --  24  GLUCOSE 89  --  136*  --  134*  BUN 64*  --  55*  --  45*  CREATININE 5.23*  --  4.27*  --  3.60*  CALCIUM 9.1  --  9.3  --  9.8  PHOS 3.8   < > 2.9 2.1* 2.1*   < > = values in this interval not displayed.   Liver Function Tests: Recent  Labs  Lab 02/01/20 0440 02/02/20 0336 02/03/20 0326 02/05/20 0322  AST 12*  --   --   --   ALT 12  --   --   --   ALKPHOS 75  --   --   --   BILITOT 0.9  --   --   --   PROT 6.6  --   --   --   ALBUMIN 2.5* 2.3* 2.4* 2.4*   No results for input(s): LIPASE, AMYLASE in the last 168 hours. No results for input(s): AMMONIA in the last 168 hours. CBC: Recent Labs  Lab 01/31/20 0236 02/01/20 0440 02/02/20 0336 02/03/20 0326 02/04/20 0455  WBC 8.8 7.4 6.5 8.1 7.9  NEUTROABS 6.7  --   --   --   --   HGB 10.2* 8.4* 7.9* 8.5* 8.5*  HCT 33.4* 27.2* 25.9* 28.0* 27.4*  MCV 89.1 88.6 89.0 89.5 89.5  PLT 238 218 204 212 187   Cardiac Enzymes: Recent Labs  Lab 01/31/20 0236  CKTOTAL 38*   CBG: Recent Labs  Lab 02/05/20 2016 02/06/20 0005 02/06/20 0442 02/06/20 0726 02/06/20 1034  GLUCAP 106* 119* 117*  121* 117*    Iron Studies:  No results for input(s): IRON, TIBC, TRANSFERRIN, FERRITIN in the last 72 hours. Studies/Results: No results found.  Medications: Infusions: . feeding supplement (OSMOLITE 1.5 CAL) Stopped (02/06/20 1001)    Scheduled Medications: . Chlorhexidine Gluconate Cloth  6 each Topical Daily  . diphenoxylate-atropine  1 tablet Oral QID  . feeding supplement (PROSource TF)  45 mL Per Tube TID  . free water  300 mL Per Tube 2 times per day  . heparin injection (subcutaneous)  5,000 Units Subcutaneous Q8H  . liver oil-zinc oxide   Topical BID  . loperamide  2 mg Oral BID  . metoprolol tartrate  25 mg Oral BID  . multivitamin with minerals  1 tablet Oral Daily  . ondansetron (ZOFRAN) IV  4 mg Intravenous TID AC  . sodium bicarbonate  1,300 mg Oral BID  . vitamin C  250 mg Oral BID    have reviewed scheduled and prn medications.  Physical Exam: General:NAD, comfortable  ENT:  Mouth dry Heart:RRR, s1s2 nl Lungs:clear b/l, no crackle Abdomen:soft, Non-tender, ileostomy bag present Extremities:No edema Neurology: Alert awake and following commands GU: foley in place  Ria Comment A Kruska 02/06/2020,11:55 AM  LOS: 6 days

## 2020-02-06 NOTE — Progress Notes (Signed)
PT Cancellation Note  Patient Details Name: Curtis Solis MRN: [id number] DOB: 1955-12-30   Cancelled Treatment:    Reason Eval/Treat Not Completed: Other (comment) RN requesting we return to work with patient mid morning- will continue to follow.   Windell Norfolk, DPT, PN1   Supplemental Physical Therapist [facility]    Pager [phone] Acute Rehab Office [phone]

## 2020-02-06 NOTE — TOC Transition Note (Signed)
Transition of Care [facility]) - CM/SW Discharge Note   Patient Details  Name: Curtis Solis MRN: [id number] Date of Birth: Jun 12, 1955  Transition of Care [facility]) CM/SW Contact:  Bethann Berkshire, [place] Phone Number: 02/06/2020, 5:06 PM   Clinical Narrative:     CSW received call from [facility] admissions notifying that [place] was approved.  Patient will DC to: [place] Anticipated DC date: 02/06/20 Family notified: Son Linton Rump Transport by: Corey Harold   Per MD patient ready for DC to [place], patient, patient's family, and facility notified of DC. Discharge Summary and FL2 sent to facility. RN to call report prior to discharge [phone]). DC packet on chart. Ambulance transport requested for patient.   CSW will sign off for now as social work intervention is no longer needed. Please consult [place] again if new needs arise.   Final next level of care: [place] Barriers to Discharge: No Barriers Identified   Patient Goals and CMS Choice Patient states their goals for this hospitalization and ongoing recovery are:: Strine, [name] (Spouse)   [phone] (Mobile) CMS Medicare.gov Compare Post Acute Care list provided to:: Patient Choice offered to / list presented to : Patient  Discharge Placement              Patient chooses bed at: [facility] Patient to be transferred to facility by: [facility] Name of family member notified: Son Linton Rump Patient and family notified of of transfer: 02/06/20  Discharge Plan and Services                                     Social Determinants of Health (SDOH) Interventions     Readmission Risk Interventions No flowsheet data found.

## 2020-02-06 NOTE — Progress Notes (Signed)
CSW spoke with [place] at [facility]. She stated she called [employer] to check on auth and was informed that a Engineer, site was reviewing clinicals. Donneta Romberg stated that if she received auth by 5pm CSW can still send pt via PTAR. In the case that Josem Kaufmann is not received, Shazma stated that Covid test from 02/05/20 would still be good for d/c tomorrow.

## 2020-02-10 ENCOUNTER — Telehealth: Payer: Self-pay | Admitting: Internal Medicine

## 2020-02-10 NOTE — Telephone Encounter (Signed)
Waiting on a referral Error

## 2020-03-04 ENCOUNTER — Emergency Department (HOSPITAL_COMMUNITY): Payer: BLUE CROSS/BLUE SHIELD

## 2020-03-04 ENCOUNTER — Inpatient Hospital Stay (HOSPITAL_COMMUNITY)
Admission: EM | Admit: 2020-03-04 | Discharge: 2020-03-14 | DRG: 981 | Disposition: A | Discharge status: Skilled Nursing Facility | Payer: BLUE CROSS/BLUE SHIELD | Source: Skilled Nursing Facility | Attending: Internal Medicine | Admitting: Internal Medicine

## 2020-03-04 ENCOUNTER — Other Ambulatory Visit: Payer: Self-pay

## 2020-03-04 DIAGNOSIS — Z8249 Family history of ischemic heart disease and other diseases of the circulatory system: Secondary | ICD-10-CM

## 2020-03-04 DIAGNOSIS — E871 Hypo-osmolality and hyponatremia: Secondary | ICD-10-CM | POA: Diagnosis present

## 2020-03-04 DIAGNOSIS — E0590 Thyrotoxicosis, unspecified without thyrotoxic crisis or storm: Secondary | ICD-10-CM | POA: Diagnosis present

## 2020-03-04 DIAGNOSIS — N289 Disorder of kidney and ureter, unspecified: Secondary | ICD-10-CM

## 2020-03-04 DIAGNOSIS — I959 Hypotension, unspecified: Secondary | ICD-10-CM | POA: Diagnosis present

## 2020-03-04 DIAGNOSIS — E43 Unspecified severe protein-calorie malnutrition: Secondary | ICD-10-CM | POA: Diagnosis present

## 2020-03-04 DIAGNOSIS — L97513 Non-pressure chronic ulcer of other part of right foot with necrosis of muscle: Secondary | ICD-10-CM | POA: Diagnosis present

## 2020-03-04 DIAGNOSIS — I96 Gangrene, not elsewhere classified: Secondary | ICD-10-CM

## 2020-03-04 DIAGNOSIS — Z932 Ileostomy status: Secondary | ICD-10-CM | POA: Diagnosis not present

## 2020-03-04 DIAGNOSIS — N185 Chronic kidney disease, stage 5: Secondary | ICD-10-CM | POA: Diagnosis not present

## 2020-03-04 DIAGNOSIS — E1122 Type 2 diabetes mellitus with diabetic chronic kidney disease: Secondary | ICD-10-CM | POA: Diagnosis present

## 2020-03-04 DIAGNOSIS — K559 Vascular disorder of intestine, unspecified: Secondary | ICD-10-CM | POA: Diagnosis present

## 2020-03-04 DIAGNOSIS — I9589 Other hypotension: Secondary | ICD-10-CM | POA: Diagnosis not present

## 2020-03-04 DIAGNOSIS — N184 Chronic kidney disease, stage 4 (severe): Secondary | ICD-10-CM

## 2020-03-04 DIAGNOSIS — Z20822 Contact with and (suspected) exposure to covid-19: Secondary | ICD-10-CM | POA: Diagnosis present

## 2020-03-04 DIAGNOSIS — Z79899 Other long term (current) drug therapy: Secondary | ICD-10-CM

## 2020-03-04 DIAGNOSIS — E875 Hyperkalemia: Secondary | ICD-10-CM | POA: Diagnosis present

## 2020-03-04 DIAGNOSIS — E271 Primary adrenocortical insufficiency: Secondary | ICD-10-CM | POA: Diagnosis present

## 2020-03-04 DIAGNOSIS — K9413 Enterostomy malfunction: Secondary | ICD-10-CM | POA: Diagnosis present

## 2020-03-04 DIAGNOSIS — Z8679 Personal history of other diseases of the circulatory system: Secondary | ICD-10-CM

## 2020-03-04 DIAGNOSIS — E860 Dehydration: Secondary | ICD-10-CM | POA: Diagnosis present

## 2020-03-04 DIAGNOSIS — S31109D Unspecified open wound of abdominal wall, unspecified quadrant without penetration into peritoneal cavity, subsequent encounter: Secondary | ICD-10-CM | POA: Diagnosis not present

## 2020-03-04 DIAGNOSIS — M86571 Other chronic hematogenous osteomyelitis, right ankle and foot: Secondary | ICD-10-CM | POA: Diagnosis not present

## 2020-03-04 DIAGNOSIS — R64 Cachexia: Secondary | ICD-10-CM | POA: Diagnosis present

## 2020-03-04 DIAGNOSIS — Z89612 Acquired absence of left leg above knee: Secondary | ICD-10-CM

## 2020-03-04 DIAGNOSIS — E1152 Type 2 diabetes mellitus with diabetic peripheral angiopathy with gangrene: Secondary | ICD-10-CM | POA: Diagnosis present

## 2020-03-04 DIAGNOSIS — E872 Acidosis: Secondary | ICD-10-CM | POA: Diagnosis present

## 2020-03-04 DIAGNOSIS — N170 Acute kidney failure with tubular necrosis: Secondary | ICD-10-CM | POA: Diagnosis present

## 2020-03-04 DIAGNOSIS — Z681 Body mass index (BMI) 19 or less, adult: Secondary | ICD-10-CM | POA: Diagnosis not present

## 2020-03-04 DIAGNOSIS — D631 Anemia in chronic kidney disease: Secondary | ICD-10-CM | POA: Diagnosis present

## 2020-03-04 DIAGNOSIS — R339 Retention of urine, unspecified: Secondary | ICD-10-CM | POA: Diagnosis present

## 2020-03-04 DIAGNOSIS — I739 Peripheral vascular disease, unspecified: Secondary | ICD-10-CM | POA: Diagnosis not present

## 2020-03-04 DIAGNOSIS — Z8674 Personal history of sudden cardiac arrest: Secondary | ICD-10-CM

## 2020-03-04 DIAGNOSIS — E039 Hypothyroidism, unspecified: Secondary | ICD-10-CM | POA: Diagnosis present

## 2020-03-04 DIAGNOSIS — E1169 Type 2 diabetes mellitus with other specified complication: Secondary | ICD-10-CM | POA: Diagnosis present

## 2020-03-04 DIAGNOSIS — I129 Hypertensive chronic kidney disease with stage 1 through stage 4 chronic kidney disease, or unspecified chronic kidney disease: Secondary | ICD-10-CM | POA: Diagnosis present

## 2020-03-04 DIAGNOSIS — K219 Gastro-esophageal reflux disease without esophagitis: Secondary | ICD-10-CM | POA: Diagnosis not present

## 2020-03-04 DIAGNOSIS — E785 Hyperlipidemia, unspecified: Secondary | ICD-10-CM | POA: Diagnosis present

## 2020-03-04 DIAGNOSIS — M86171 Other acute osteomyelitis, right ankle and foot: Secondary | ICD-10-CM | POA: Diagnosis not present

## 2020-03-04 DIAGNOSIS — M869 Osteomyelitis, unspecified: Secondary | ICD-10-CM

## 2020-03-04 DIAGNOSIS — E861 Hypovolemia: Secondary | ICD-10-CM | POA: Diagnosis not present

## 2020-03-04 DIAGNOSIS — E11621 Type 2 diabetes mellitus with foot ulcer: Secondary | ICD-10-CM | POA: Diagnosis present

## 2020-03-04 DIAGNOSIS — N179 Acute kidney failure, unspecified: Secondary | ICD-10-CM

## 2020-03-04 DIAGNOSIS — Z87891 Personal history of nicotine dependence: Secondary | ICD-10-CM

## 2020-03-04 DIAGNOSIS — S3120XD Unspecified open wound of penis, subsequent encounter: Secondary | ICD-10-CM | POA: Diagnosis not present

## 2020-03-04 DIAGNOSIS — S3120XA Unspecified open wound of penis, initial encounter: Secondary | ICD-10-CM | POA: Diagnosis present

## 2020-03-04 LAB — URINALYSIS, ROUTINE W REFLEX MICROSCOPIC
Bilirubin Urine: NEGATIVE
Glucose, UA: NEGATIVE mg/dL
Ketones, ur: NEGATIVE mg/dL
Nitrite: NEGATIVE
Protein, ur: 30 mg/dL — AB
RBC / HPF: >50 RBC/hpf — ABNORMAL HIGH (ref 0–5)
Specific Gravity, Urine: 1.018 (ref 1.005–1.030)
WBC, UA: >50 WBC/hpf — ABNORMAL HIGH (ref 0–5)
pH: 5 (ref 5.0–8.0)

## 2020-03-04 LAB — CBC
HCT: 24.8 % — ABNORMAL LOW (ref 39.0–52.0)
Hemoglobin: 7.7 g/dL — ABNORMAL LOW (ref 13.0–17.0)
MCH: 28.2 pg (ref 26.0–34.0)
MCHC: 31 g/dL (ref 30.0–36.0)
MCV: 90.8 fL (ref 80.0–100.0)
Platelets: 284 10*3/uL (ref 150–400)
RBC: 2.73 MIL/uL — ABNORMAL LOW (ref 4.22–5.81)
RDW: 17.5 % — ABNORMAL HIGH (ref 11.5–15.5)
WBC: 12.6 10*3/uL — ABNORMAL HIGH (ref 4.0–10.5)
nRBC: 0 % (ref 0.0–0.2)

## 2020-03-04 LAB — BASIC METABOLIC PANEL
Anion gap: 15 (ref 5–15)
BUN: 84 mg/dL — ABNORMAL HIGH (ref 8–23)
CO2: 22 mmol/L (ref 22–32)
Calcium: 9.7 mg/dL (ref 8.9–10.3)
Chloride: 89 mmol/L — ABNORMAL LOW (ref 98–111)
Creatinine, Ser: 10 mg/dL — ABNORMAL HIGH (ref 0.61–1.24)
GFR, Estimated: 5 mL/min — ABNORMAL LOW (ref >60)
Glucose, Bld: 106 mg/dL — ABNORMAL HIGH (ref 70–99)
Potassium: 5.2 mmol/L — ABNORMAL HIGH (ref 3.5–5.1)
Sodium: 126 mmol/L — ABNORMAL LOW (ref 135–145)

## 2020-03-04 LAB — CK: Total CK: 26 U/L — ABNORMAL LOW (ref 49–397)

## 2020-03-04 LAB — LACTIC ACID, PLASMA: Lactic Acid, Venous: 1.1 mmol/L (ref 0.5–1.9)

## 2020-03-04 LAB — SODIUM, URINE, RANDOM: Sodium, Ur: 11 mmol/L

## 2020-03-04 LAB — OSMOLALITY, URINE: Osmolality, Ur: 302 mOsm/kg (ref 300–900)

## 2020-03-04 MED ORDER — SODIUM CHLORIDE 0.9 % IV SOLN: 1000.0000 mL | INTRAVENOUS | Status: DC

## 2020-03-04 MED ORDER — SODIUM CHLORIDE 0.9 % IV BOLUS (SEPSIS): 1000.0000 mL | Freq: Once | INTRAVENOUS | Status: DC

## 2020-03-04 MED ORDER — SODIUM CHLORIDE 0.9 % IV BOLUS
1000.0000 mL | Freq: Once | INTRAVENOUS | Status: AC
  Administered 2020-03-04: 1000 mL via INTRAVENOUS

## 2020-03-04 MED ORDER — HYDROCORTISONE NA SUCCINATE PF 100 MG IJ SOLR
50.0000 mg | Freq: Once | INTRAMUSCULAR | Status: AC
  Administered 2020-03-04: 50 mg via INTRAVENOUS
  Filled 2020-03-04: qty 2

## 2020-03-04 NOTE — Consult Note (Signed)
Referring Provider: No ref. provider found Primary Care Physician:  [name], MD Primary Nephrologist:     Reason for Consultation:   Acute kidney injury, chronic kidney disease stage IV, hyperkalemia and hyponatremia with history of Addison's disease.    HPI:  , This is a 65 y.o. Black or African American male who is resident of an skilled nursing facility.  He has a history of diabetes mellitus type 2, HTN, PAD, tobacco use.   He was recently discharged 02/20/2020-02/25/2020 [facility].   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  He did not require dialysis on this admission and his hyperkalemia was medically managed.  He was subsequently diagnosed with hypoaldosteronism otherwise known as Addison's disease.  He was prescribed Florinef by his nephrologist Dr. Shelton Silvas  He was sent to the emergency room from [place] facility with abnormal labs and hypotension  Blood pressure 87/53 pulse 96 temperature 98.9    Sodium 126 potassium 5.2 chloride 89 CO2 22 BUN 84 creatinine 10 glucose 106 calcium 9.7 hemoglobin 7.7   bladder scan 8 cc    Past Medical History:  Diagnosis Date  . A-fib [facility])    During hospital stay  . CKD (chronic kidney disease)   . Hyperlipidemia   . Hyperthyroidism   . PVD (peripheral  vascular disease) ([place])     Past Surgical History:  Procedure Laterality Date  . ABDOMINAL AORTIC ANEURYSM REPAIR    . ABOVE KNEE LEG AMPUTATION Left   . BOWEL RESECTION     for dead bowel, s/p open abdomen  . COLOSTOMY    . GASTROSTOMY    . left leg amputation    . PERICARDIAL FLUID DRAINAGE     Placed for tamponade and removed during the [facility] hospitalization    Prior to Admission medications   Medication Sig Start Date End Date Taking? Authorizing Provider  atorvastatin (LIPITOR) 40 MG tablet Take 40 mg by mouth daily.    [provider]  calcium carbonate (TUMS - DOSED IN MG ELEMENTAL CALCIUM) 500 MG chewable tablet Chew 1 tablet by mouth every 8 (eight) hours as needed for indigestion or heartburn.    [provider]  diphenoxylate-atropine (LOMOTIL) 2.5-0.025 MG tablet Take 1 tablet by mouth every 6 (six) hours. 6 AM, 12 noon, 6 PM and 12 AM    [provider]  gabapentin (NEURONTIN) 100 MG capsule Take 100 mg by mouth at bedtime.    [provider]  hydrOXYzine (ATARAX/VISTARIL) 10 MG tablet Take 10 mg by mouth every 6 (six) hours as needed for anxiety.    [provider]  ipratropium-albuterol (DUONEB) 0.5-2.5 (3) MG/3ML SOLN Take 3 mLs by nebulization every 6 (six) hours as needed (wheezing/ cough).    [provider]  loperamide (IMODIUM A-D) 2 MG tablet Take 2 mg by mouth 4 (four) times daily as needed for diarrhea or loose stools.  [provider]  metoprolol tartrate (LOPRESSOR) 25 MG tablet Take 12.5 mg by mouth 2 (two) times daily. 9 AM and 9 PM    [provider]  Nutritional Supplements (FEEDING SUPPLEMENT, OSMOLITE 1.5 CAL,) LIQD Place 780 mLs into feeding tube continuous. 02/05/20   Charlynne Cousins, MD  Nutritional Supplements (FEEDING SUPPLEMENT, PROSOURCE TF,) liquid Place 45 mLs into feeding tube 3 (three) times daily. 02/05/20   Charlynne Cousins, MD  oxyCODONE (ROXICODONE) 5 MG/5ML  solution Take 5 mLs (5 mg total) by mouth every 4 (four) hours as needed (pain). 02/05/20   Charlynne Cousins, MD  pantoprazole (PROTONIX) 20 MG tablet Take 20 mg by mouth daily.    [provider]  simethicone (MYLICON) 80 MG chewable tablet Chew 80 mg by mouth every 8 (eight) hours as needed for flatulence.    [provider]  sodium bicarbonate 650 MG tablet Take 2 tablets (1,300 mg total) by mouth 2 (two) times daily. 02/05/20   Charlynne Cousins, MD  traMADol (ULTRAM) 50 MG tablet Take 50 mg by mouth every 8 (eight) hours. 6 AM, 2 PM, 10 PM    [provider]  Water For Irrigation, Sterile (FREE WATER) SOLN Place 300 mLs into feeding tube every 4 (four) hours. 02/05/20   Charlynne Cousins, MD    Current Facility-Administered Medications  Medication Dose Route Frequency Provider Last Rate Last Admin  . hydrocortisone sodium succinate (SOLU-CORTEF) 100 MG injection 50 mg  50 mg Intravenous Once Garald Balding, PA-C       Current Outpatient Medications  Medication Sig Dispense Refill  . atorvastatin (LIPITOR) 40 MG tablet Take 40 mg by mouth daily.    . calcium carbonate (TUMS - DOSED IN MG ELEMENTAL CALCIUM) 500 MG chewable tablet Chew 1 tablet by mouth every 8 (eight) hours as needed for indigestion or heartburn.    . diphenoxylate-atropine (LOMOTIL) 2.5-0.025 MG tablet Take 1 tablet by mouth every 6 (six) hours. 6 AM, 12 noon, 6 PM and 12 AM    . gabapentin (NEURONTIN) 100 MG capsule Take 100 mg by mouth at bedtime.    . hydrOXYzine (ATARAX/VISTARIL) 10 MG tablet Take 10 mg by mouth every 6 (six) hours as needed for anxiety.    Marland Kitchen ipratropium-albuterol (DUONEB) 0.5-2.5 (3) MG/3ML SOLN Take 3 mLs by nebulization every 6 (six) hours as needed (wheezing/ cough).    Marland Kitchen loperamide (IMODIUM A-D) 2 MG tablet Take 2 mg by mouth 4 (four) times daily as needed for diarrhea or loose stools.    . metoprolol tartrate (LOPRESSOR) 25 MG tablet Take 12.5 mg by mouth 2 (two)  times daily. 9 AM and 9 PM    . Nutritional Supplements (FEEDING SUPPLEMENT, OSMOLITE 1.5 CAL,) LIQD Place 780 mLs into feeding tube continuous.  0  . Nutritional Supplements (FEEDING SUPPLEMENT, PROSOURCE TF,) liquid Place 45 mLs into feeding tube 3 (three) times daily.    Marland Kitchen oxyCODONE (ROXICODONE) 5 MG/5ML solution Take 5 mLs (5 mg total) by mouth every 4 (four) hours as needed (pain). 25 mL 0  . pantoprazole (PROTONIX) 20 MG tablet Take 20 mg by mouth daily.    . simethicone (MYLICON) 80 MG chewable tablet Chew 80 mg by mouth every 8 (eight) hours as needed for flatulence.    . sodium bicarbonate 650 MG tablet Take 2 tablets (1,300 mg total) by mouth 2 (two) times daily.    . traMADol (ULTRAM) 50 MG tablet Take 50 mg by mouth  every 8 (eight) hours. 6 AM, 2 PM, 10 PM    . Water For Irrigation, Sterile (FREE WATER) SOLN Place 300 mLs into feeding tube every 4 (four) hours.      Allergies as of 03/04/2020  . (No Known Allergies)    No family history on file.  Social History   Socioeconomic History  . Marital status: Married    Spouse name: Not on file  . Number of children: Not on file  . Years of education: Not on file  . Highest education level: Not on file  Occupational History  . Not on file  Tobacco Use  . Smoking status: Former Smoker    Quit date: 09/26/2019    Years since quitting: 0.4  . Smokeless tobacco: Not on file  Substance and Sexual Activity  . Alcohol use: Yes  . Drug use: Not on file  . Sexual activity: Not on file  Other Topics Concern  . Not on file  Social History Narrative  . Not on file   Social Determinants of Health   Financial Resource Strain: Not on file  Food Insecurity: Not on file  Transportation Needs: Not on file  Physical Activity: Not on file  Stress: Not on file  Social Connections: Not on file  Intimate Partner Violence: Not on file    Review of Systems: General no fever sweats chills Eyes no visual complaints Ears nose mouth  throat no hearing loss or epistaxis Cardiovascular no shortness of breath Respiratory no cough no wheeze no hemoptysis Abdominal system status post G-tube well-healed ileostomy Urogenital Foley catheter straight drain Neurologic no stroke or seizures Endocrine diabetic possible Addison's     Physical Exam: Vital signs in last 24 hours: Temp:  [98.9 F (37.2 C)] 98.9 F (37.2 C) (02/23 1808) Pulse Rate:  [96] 96 (02/23 1900) Resp:  [18] 18 (02/23 1900) BP: (81-87)/(53-55) 87/53 (02/23 1900) SpO2:  [100 %] 100 % (02/23 1900) Weight:  [55.8 kg] 55.8 kg (02/23 1809)   General: chronically ill appearing, thin man sitting up in bed, nondistressed CARDIOVASCULAR:regular rate and rhythm, no murmurs, gallops or rubs   RESPIRATORYclear to auscultation bilaterally, no wheezes rhonchi or rales ABDOMINAL:bowel sounds present, soft; PEG tube in place, ileostomy present/protuberant/pink/patent/producing output, surgical scars c/d/i with dry dressing. nontender to palpation. NEUROLOGICAL:awake, alert and oriented x 3 GENITOURINARY:Foley catheter in place. From 2/12 exam: uncircumcised penis with scar tissue overlying mildly edematous foreskin; foreskin mildly difficult to retract, penile head moist, pale, small nascent ulcer on ventral aspect of urethral meatus. Nontender. Well-healed L inguinal surgical scar. MUSCULOSKELETAL:L AKA with well-healed scar; otherwise some muscle wasting but with normal morphology. Right foot with overlying dressing to calf. SKIN:dry skin throughout, warm, well-perfused.      Intake/Output from previous day: No intake/output data recorded. Intake/Output this shift: No intake/output data recorded.  Lab Results: Recent Labs    03/04/20 1821  WBC 12.6*  HGB 7.7*  HCT 24.8*  PLT 284   BMET Recent Labs    03/04/20 1821  NA 126*  K 5.2*  CL 89*  CO2 22  GLUCOSE 106*  BUN 84*  CREATININE 10.00*  CALCIUM 9.7   LFT No results for input(s):  PROT, ALBUMIN, AST, ALT, ALKPHOS, BILITOT, BILIDIR, IBILI in the last 72 hours. PT/INR No results for input(s): LABPROT, INR in the last 72 hours. Hepatitis Panel No results for input(s): HEPBSAG, HCVAB, HEPAIGM, HEPBIGM in the last 72 hours.  Studies/Results: No results found.  Assessment/Plan:  Acute on  chronic kidney disease.  Favor dehydration.  Patient may also be addisonian.  I do not see a cortisol level.  It may be worthwhile sending a cortisol prior to the administration of hydrocortisone stress dose steroids.  Bladder scan does not reveal any evidence of obstruction.  It appears that his baseline creatinine on discharge from [facility] was about 3 mg/dL.  He has had a very complicated course with an episode of acute kidney injury requiring dialysis following his abdominal aortic aneurysm repair.  We should send urinalysis urine sodium.  Continue Foley catheter to monitor I's and O's.  Recommend checking daily renal panel.  Renal ultrasound may be indicated.  Would continue IV fluids rehydration with normal saline.  Would not use lactate Ringer's due to hyperkalemia.  There are no urgent indications for dialysis.  In view of his very prolonged clinical course it may be wise to involve palliative medicine and establish goals of care.  Hyponatremia this could be related to his dehydration will check urine osmolality.  He also may be adrenally insufficient  Hypotension check cortisol blood cultures  Hyperkalemia avoid potassium containing solute.  We will continue to monitor.  Diabetes mellitus as per primary service   LOS: 0 Sherril Croon '@TODAY''@7'$ :56 PM

## 2020-03-04 NOTE — ED Provider Notes (Addendum)
[facility] EMERGENCY DEPARTMENT Provider Note   CSN: [id number] Arrival date & time: 03/04/20  1755     History Chief Complaint  Patient presents with   Abnormal Lab    Curtis Solis is a 65 y.o. male.  HPI 65 year old male with a history of CKD, hyperlipidemia, hypothyroidism, PVD with chronic lower extremity wounds on the right with an AKA on the left, open wound of the penis with chronic Foley, colostomy presents to the ER from [place] and rehab facility with a potassium of 5.5 and a sodium of 129.  Patient has had multiple admissions for similar lab abnormalities, most recently on 2/10 over at [facility].  While there, he was diagnosed with hypoaldosteronism.  Per chart review, [facility] nephrology had plans to call in fludrocortisone daily as of today.  Patient reports he is not taking this medicine.  He hasn't no other complaints at this time.  Feels as though he is at his baseline    Past Medical History:  Diagnosis Date   A-fib [facility])    During hospital stay   CKD (chronic kidney disease)    Hyperlipidemia    Hyperthyroidism    PVD (peripheral vascular disease) ([place])     Patient Active Problem List   Diagnosis Date Noted   Protein-calorie malnutrition, severe 02/04/2020   Acute kidney failure ([place]) 01/31/2020   Hyperkalemia 01/31/2020   Uremia 01/31/2020   Necrotic toes ([place]) 01/31/2020   Colostomy present ([place]) 01/31/2020   Open abdominal wall wound, subsequent encounter 01/31/2020   Open wound of penis 01/31/2020   Acute lower UTI 01/31/2020   Pressure injury of skin 01/31/2020    Past Surgical History:  Procedure Laterality Date   ABDOMINAL AORTIC ANEURYSM REPAIR     ABOVE KNEE LEG AMPUTATION Left    BOWEL RESECTION     for dead bowel, s/p open abdomen   COLOSTOMY     GASTROSTOMY     left leg amputation     PERICARDIAL FLUID DRAINAGE     Placed for tamponade and removed  during the [facility] hospitalization       No family history on file.  Social History   Tobacco Use   Smoking status: Former Smoker    Quit date: 09/26/2019    Years since quitting: 0.4  Substance Use Topics   Alcohol use: Yes    Home Medications Prior to Admission medications   Medication Sig Start Date End Date Taking? Authorizing Provider  atorvastatin (LIPITOR) 40 MG tablet Take 40 mg by mouth daily.    [provider]  calcium carbonate (TUMS - DOSED IN MG ELEMENTAL CALCIUM) 500 MG chewable tablet Chew 1 tablet by mouth every 8 (eight) hours as needed for indigestion or heartburn.    [provider]  diphenoxylate-atropine (LOMOTIL) 2.5-0.025 MG tablet Take 1 tablet by mouth every 6 (six) hours. 6 AM, 12 noon, 6 PM and 12 AM    [provider]  gabapentin (NEURONTIN) 100 MG capsule Take 100 mg by mouth at bedtime.    [provider]  hydrOXYzine (ATARAX/VISTARIL) 10 MG tablet Take 10 mg by mouth every 6 (six) hours as needed for anxiety.    [provider]  ipratropium-albuterol (DUONEB) 0.5-2.5 (3) MG/3ML SOLN Take 3 mLs by nebulization every 6 (six) hours as needed (wheezing/ cough).    [provider]  loperamide (IMODIUM A-D) 2 MG tablet Take 2 mg by mouth 4 (four) times daily  as needed for diarrhea or loose stools.    [provider]  metoprolol tartrate (LOPRESSOR) 25 MG tablet Take 12.5 mg by mouth 2 (two) times daily. 9 AM and 9 PM    [provider]  Nutritional Supplements (FEEDING SUPPLEMENT, OSMOLITE 1.5 CAL,) LIQD Place 780 mLs into feeding tube continuous. 02/05/20   Charlynne Cousins, MD  Nutritional Supplements (FEEDING SUPPLEMENT, PROSOURCE TF,) liquid Place 45 mLs into feeding tube 3 (three) times daily. 02/05/20   Charlynne Cousins, MD  oxyCODONE (ROXICODONE) 5 MG/5ML solution Take 5 mLs (5 mg total) by mouth every 4 (four) hours as needed (pain). 02/05/20   Charlynne Cousins, MD   pantoprazole (PROTONIX) 20 MG tablet Take 20 mg by mouth daily.    [provider]  simethicone (MYLICON) 80 MG chewable tablet Chew 80 mg by mouth every 8 (eight) hours as needed for flatulence.    [provider]  sodium bicarbonate 650 MG tablet Take 2 tablets (1,300 mg total) by mouth 2 (two) times daily. 02/05/20   Charlynne Cousins, MD  traMADol (ULTRAM) 50 MG tablet Take 50 mg by mouth every 8 (eight) hours. 6 AM, 2 PM, 10 PM    [provider]  Water For Irrigation, Sterile (FREE WATER) SOLN Place 300 mLs into feeding tube every 4 (four) hours. 02/05/20   Charlynne Cousins, MD    Allergies    Patient has no known allergies.  Review of Systems   Review of Systems  Constitutional: Negative for chills and fever.  HENT: Negative for ear pain and sore throat.   Eyes: Negative for pain and visual disturbance.  Respiratory: Negative for cough and shortness of breath.   Cardiovascular: Negative for chest pain and palpitations.  Gastrointestinal: Negative for abdominal pain and vomiting.  Genitourinary: Negative for dysuria and hematuria.  Musculoskeletal: Negative for arthralgias and back pain.  Skin: Negative for color change and rash.  Neurological: Negative for seizures and syncope.  All other systems reviewed and are negative.   Physical Exam Updated Vital Signs BP (!) 92/56 (BP Location: Right Arm)    Pulse 93    Temp 98.9 F (37.2 C) (Oral)    Resp 16    Ht '5\' 11"'$  (1.803 m)    Wt 55.8 kg    SpO2 100%    BMI 17.16 kg/m   Physical Exam Vitals and nursing note reviewed.  Constitutional:      General: He is not in acute distress.    Appearance: He is well-developed and well-nourished. He is not ill-appearing, toxic-appearing or diaphoretic.  HENT:     Head: Normocephalic and atraumatic.  Eyes:     Conjunctiva/sclera: Conjunctivae normal.  Cardiovascular:     Rate and Rhythm: Normal rate and regular rhythm.     Pulses: Normal pulses.      Heart sounds: No murmur heard.   Pulmonary:     Effort: Pulmonary effort is normal. No respiratory distress.     Breath sounds: Normal breath sounds.  Abdominal:     General: Abdomen is flat.     Palpations: Abdomen is soft.     Tenderness: There is no abdominal tenderness.     Comments: Colostomy bag with stoma on the right, abdomen is soft and nontender, no evidence of infection around the stoma  Musculoskeletal:        General: No edema.     Cervical back: Neck supple.     Comments: Left AKA with evidence  of chronic wounds to the right lower extremity, wrapped  Skin:    General: Skin is warm and dry.  Neurological:     General: No focal deficit present.     Mental Status: He is alert and oriented to person, place, and time.  Psychiatric:        Mood and Affect: Mood and affect and mood normal.        Behavior: Behavior normal.     ED Results / Procedures / Treatments   Labs (all labs ordered are listed, but only abnormal results are displayed) Labs Reviewed  CBC - Abnormal; Notable for the following components:      Result Value   WBC 12.6 (*)    RBC 2.73 (*)    Hemoglobin 7.7 (*)    HCT 24.8 (*)    RDW 17.5 (*)    All other components within normal limits  BASIC METABOLIC PANEL - Abnormal; Notable for the following components:   Sodium 126 (*)    Potassium 5.2 (*)    Chloride 89 (*)    Glucose, Bld 106 (*)    BUN 84 (*)    Creatinine, Ser 10.00 (*)    GFR, Estimated 5 (*)    All other components within normal limits  URINALYSIS, ROUTINE W REFLEX MICROSCOPIC - Abnormal; Notable for the following components:   Color, Urine AMBER (*)    APPearance CLOUDY (*)    Hgb urine dipstick MODERATE (*)    Protein, ur 30 (*)    Leukocytes,Ua LARGE (*)    RBC / HPF >50 (*)    WBC, UA >50 (*)    Bacteria, UA FEW (*)    All other components within normal limits  CULTURE, BLOOD (ROUTINE X 2)  CULTURE, BLOOD (ROUTINE X 2)  SARS CORONAVIRUS 2 (TAT 6-24 HRS)  URINE CULTURE   LACTIC ACID, PLASMA  OSMOLALITY, URINE  SODIUM, URINE, RANDOM  LACTIC ACID, PLASMA  CORTISOL  RENAL FUNCTION PANEL    EKG EKG Interpretation  Date/Time:  Wednesday March 04 2020 18:58:07 EST Ventricular Rate:  92 PR Interval:  170 QRS Duration: 102 QT Interval:  338 QTC Calculation: 417 R Axis:   4 Text Interpretation: Normal sinus rhythm Minimal voltage criteria for LVH, may be normal variant ( Cornell product ) Borderline ECG Confirmed by Madalyn Rob [phone]) on 03/04/2020 7:08:06 PM   Radiology US RENAL  Result Date: 03/04/2020 CLINICAL DATA:  Acute renal disease. EXAM: RENAL / URINARY TRACT ULTRASOUND COMPLETE COMPARISON:  None. FINDINGS: Right Kidney: Renal measurements: 10.9 cm x 4.4 cm x 4.3 cm = volume: 106.26 mL. Echogenicity within normal limits. A 3.3 cm x 2.8 cm x 2.4 cm anechoic structure is seen within the upper pole of the right kidney. No abnormal flow is seen within this region on color Doppler evaluation. No hydronephrosis is visualized. Left Kidney: Renal measurements: 11.0 cm x 5.4 cm x 4.2 cm = volume: 130.80 mL. Echogenicity within normal limits. 2.8 cm x 2.0 cm x 2.1 cm and 1.8 cm x 1.5 cm x 1.7 cm anechoic structures are seen within the lower pole of the left kidney. An additional 2.1 cm x 1.9 cm x 2.5 cm anechoic structure is seen within the upper pole of the left kidney. No abnormal flow is seen within these regions on color Doppler evaluation. No hydronephrosis is visualized. Bladder: Limited in evaluation secondary to the presence of overlying bandages. Other: Of incidental note is the presence of a 4.3 cm hypoechoic  area seen in between the spleen and left kidney. This area could not be duplicated clearly in transverse plane, as per the ultrasound technologist. IMPRESSION: Multiple bilateral simple renal cysts. Electronically Signed   By: Virgina Norfolk M.D.   On: 03/04/2020 22:39    Procedures .Critical Care Performed by: Garald Balding,  PA-C Authorized by: Garald Balding, PA-C   Critical care provider statement:    Critical care time (minutes):  45   Critical care was necessary to treat or prevent imminent or life-threatening deterioration of the following conditions:  Dehydration and renal failure   Critical care was time spent personally by me on the following activities:  Discussions with consultants, evaluation of patient's response to treatment, examination of patient, ordering and performing treatments and interventions, ordering and review of laboratory studies, ordering and review of radiographic studies, pulse oximetry, re-evaluation of patient's condition, obtaining history from patient or surrogate and review of old charts     Medications Ordered in ED Medications  sodium chloride 0.9 % bolus 1,000 mL (has no administration in time range)    Followed by  0.9 %  sodium chloride infusion (has no administration in time range)  sodium chloride 0.9 % bolus 1,000 mL (0 mLs Intravenous Stopped 03/04/20 2132)  hydrocortisone sodium succinate (SOLU-CORTEF) 100 MG injection 50 mg (50 mg Intravenous Given 03/04/20 2023)  sodium chloride 0.9 % bolus 1,000 mL (0 mLs Intravenous Stopped 03/04/20 2317)    ED Course  I have reviewed the triage vital signs and the nursing notes.  Pertinent labs & imaging results that were available during my care of the patient were reviewed by me and considered in my medical decision making (see chart for details).    MDM Rules/Calculators/A&P                          65 year old male with reported hyperkalemia and hyponatremia from his facility On arrival, he is hypotensive with a blood pressure of 81/55, afebrile, not tachycardic, tachypneic or hypoxic.  Patient states that he does run low with his blood pressure in the 0000000 systolic.  He has no complaints at this time.  Physical exam is benign.  Lab work ordered, reviewed and interpreted by me -CBC with a leukocytosis of 12.6, hemoglobin  of 7.7, mildly below baseline.  -BMP today shows a sodium of 126, potassium 5.2.  Most notably, patient's creatinine today is 10, with values several weeks ago at 3-4.  He does have a history of having a creatinine of 10 but this is not at his baseline. -UA with moderate hemoglobin, large leukocytes, more than 50 WBCs, few bacteria.  Patient has a chronic Foley, so we'll send urine for culture. -Initial lactic of 1.1.  EKG reviewed by myself my supervising physician, with no acute changes  MDM: Low suspicion for sepsis at this time as per discussion with Dr. Roslynn Amble, suspect hypotension secondary to volume depletion. Afebrile, not tachycardic, lactic is normal. Given significant AKI, consulted Dr. Justin Mend with nephrology.  He recommends fluid resuscitation, steroids, renal ultrasound to evaluate for obstruction.  Recommends admission for close monitoring.  Patient remained hypotensive throughout the ED course, after 1 L of fluids, blood pressure was rechecked with smaller cuff and showed a blood pressure of 98/52 which is the patient's baseline per patient report.  Will consult hospitalist team for admission.   Spoke with Dr. Cyd Silence with the hospitalist team, he requests chest xray which has been ordered.  Will continue with fluid resuscitation, stable for admission at this time.  I did discuss the findings with the patient's wife who is his POA.  Also attempted to contact his son per her request, with no answer.  This was a shared visit with my supervising physician Dr. Roslynn Amble who independently saw and evaluated the patient & provided guidance in evaluation/management/disposition ,in agreement with care  Final Clinical Impression(s) / ED Diagnoses Final diagnoses:  AKI (acute kidney injury) ([place])  Hypotension, unspecified hypotension type  Hyperkalemia  Hyponatremia    Rx / DC Orders ED Discharge Orders         Ordered    CK        03/04/20 1914               Lyndel Safe 03/04/20 2326    Lucrezia Starch, MD 03/06/20 [phone]

## 2020-03-04 NOTE — ED Triage Notes (Signed)
Pt bib [facility] from [facility].  Pt had labs drawn today and abnormal results reported.  Na=129, K=5.5.  Pt sent from facility for eval. Pt denies any complaints.    [facility] VS: BP=90s/50s -pt stated this is normal for him cbg=129

## 2020-03-04 NOTE — ED Notes (Addendum)
Bladder scan resulted in 8 mL.

## 2020-03-04 NOTE — ED Notes (Signed)
Pt returned from radiology, Dr. Roslynn Amble at bedside.

## 2020-03-04 NOTE — ED Notes (Signed)
Pt's IV appears to be infiltrated. IV fluids stopped at this time. Primary RN notified.

## 2020-03-05 ENCOUNTER — Inpatient Hospital Stay (HOSPITAL_COMMUNITY): Payer: BLUE CROSS/BLUE SHIELD

## 2020-03-05 ENCOUNTER — Encounter (HOSPITAL_COMMUNITY): Payer: Self-pay | Admitting: Internal Medicine

## 2020-03-05 DIAGNOSIS — K219 Gastro-esophageal reflux disease without esophagitis: Secondary | ICD-10-CM

## 2020-03-05 DIAGNOSIS — I959 Hypotension, unspecified: Secondary | ICD-10-CM | POA: Diagnosis present

## 2020-03-05 DIAGNOSIS — I9589 Other hypotension: Secondary | ICD-10-CM

## 2020-03-05 DIAGNOSIS — S3120XD Unspecified open wound of penis, subsequent encounter: Secondary | ICD-10-CM

## 2020-03-05 DIAGNOSIS — E43 Unspecified severe protein-calorie malnutrition: Secondary | ICD-10-CM

## 2020-03-05 DIAGNOSIS — E861 Hypovolemia: Secondary | ICD-10-CM

## 2020-03-05 DIAGNOSIS — R339 Retention of urine, unspecified: Secondary | ICD-10-CM

## 2020-03-05 DIAGNOSIS — E1122 Type 2 diabetes mellitus with diabetic chronic kidney disease: Secondary | ICD-10-CM | POA: Diagnosis present

## 2020-03-05 LAB — BASIC METABOLIC PANEL
Anion gap: 14 (ref 5–15)
BUN: 82 mg/dL — ABNORMAL HIGH (ref 8–23)
CO2: 19 mmol/L — ABNORMAL LOW (ref 22–32)
Calcium: 9.5 mg/dL (ref 8.9–10.3)
Chloride: 99 mmol/L (ref 98–111)
Creatinine, Ser: 8 mg/dL — ABNORMAL HIGH (ref 0.61–1.24)
GFR, Estimated: 7 mL/min — ABNORMAL LOW (ref >60)
Glucose, Bld: 144 mg/dL — ABNORMAL HIGH (ref 70–99)
Potassium: 5.4 mmol/L — ABNORMAL HIGH (ref 3.5–5.1)
Sodium: 132 mmol/L — ABNORMAL LOW (ref 135–145)

## 2020-03-05 LAB — SARS CORONAVIRUS 2 (TAT 6-24 HRS): SARS Coronavirus 2: NEGATIVE

## 2020-03-05 LAB — CBC WITH DIFFERENTIAL/PLATELET
Abs Immature Granulocytes: 0.14 10*3/uL — ABNORMAL HIGH (ref 0.00–0.07)
Basophils Absolute: 0 10*3/uL (ref 0.0–0.1)
Basophils Relative: 0 %
Eosinophils Absolute: 0.1 10*3/uL (ref 0.0–0.5)
Eosinophils Relative: 1 %
HCT: 23.4 % — ABNORMAL LOW (ref 39.0–52.0)
Hemoglobin: 7.3 g/dL — ABNORMAL LOW (ref 13.0–17.0)
Immature Granulocytes: 1 %
Lymphocytes Relative: 11 %
Lymphs Abs: 1.2 10*3/uL (ref 0.7–4.0)
MCH: 29 pg (ref 26.0–34.0)
MCHC: 31.2 g/dL (ref 30.0–36.0)
MCV: 92.9 fL (ref 80.0–100.0)
Monocytes Absolute: 0.6 10*3/uL (ref 0.1–1.0)
Monocytes Relative: 6 %
Neutro Abs: 8.9 10*3/uL — ABNORMAL HIGH (ref 1.7–7.7)
Neutrophils Relative %: 81 %
Platelets: 233 10*3/uL (ref 150–400)
RBC: 2.52 MIL/uL — ABNORMAL LOW (ref 4.22–5.81)
RDW: 17.4 % — ABNORMAL HIGH (ref 11.5–15.5)
WBC: 11 10*3/uL — ABNORMAL HIGH (ref 4.0–10.5)
nRBC: 0 % (ref 0.0–0.2)

## 2020-03-05 LAB — RENAL FUNCTION PANEL
Albumin: 2.4 g/dL — ABNORMAL LOW (ref 3.5–5.0)
Anion gap: 15 (ref 5–15)
BUN: 84 mg/dL — ABNORMAL HIGH (ref 8–23)
CO2: 20 mmol/L — ABNORMAL LOW (ref 22–32)
Calcium: 9.4 mg/dL (ref 8.9–10.3)
Chloride: 95 mmol/L — ABNORMAL LOW (ref 98–111)
Creatinine, Ser: 9.29 mg/dL — ABNORMAL HIGH (ref 0.61–1.24)
GFR, Estimated: 6 mL/min — ABNORMAL LOW (ref >60)
Glucose, Bld: 138 mg/dL — ABNORMAL HIGH (ref 70–99)
Phosphorus: 8.2 mg/dL — ABNORMAL HIGH (ref 2.5–4.6)
Potassium: 5.6 mmol/L — ABNORMAL HIGH (ref 3.5–5.1)
Sodium: 130 mmol/L — ABNORMAL LOW (ref 135–145)

## 2020-03-05 LAB — APTT: aPTT: 27 seconds (ref 24–36)

## 2020-03-05 LAB — CBG MONITORING, ED
Glucose-Capillary: 97 mg/dL (ref 70–99)
Glucose-Capillary: 127 mg/dL — ABNORMAL HIGH (ref 70–99)

## 2020-03-05 LAB — PROCALCITONIN: Procalcitonin: 0.34 ng/mL

## 2020-03-05 LAB — HEMOGLOBIN A1C
Hgb A1c MFr Bld: 5.1 % (ref 4.8–5.6)
Mean Plasma Glucose: 99.67 mg/dL

## 2020-03-05 LAB — GLUCOSE, CAPILLARY
Glucose-Capillary: 146 mg/dL — ABNORMAL HIGH (ref 70–99)
Glucose-Capillary: 141 mg/dL — ABNORMAL HIGH (ref 70–99)

## 2020-03-05 LAB — MAGNESIUM: Magnesium: 2 mg/dL (ref 1.7–2.4)

## 2020-03-05 LAB — ABO/RH: ABO/RH(D): B POS

## 2020-03-05 LAB — CORTISOL-AM, BLOOD: Cortisol - AM: 24.5 ug/dL — ABNORMAL HIGH (ref 6.7–22.6)

## 2020-03-05 LAB — CORTISOL: Cortisol, Plasma: 23.6 ug/dL

## 2020-03-05 LAB — PROTIME-INR
INR: 1.2 (ref 0.8–1.2)
Prothrombin Time: 15 seconds (ref 11.4–15.2)

## 2020-03-05 MED ORDER — ONDANSETRON HCL 4 MG/2ML IJ SOLN
4.0000 mg | Freq: Four times a day (QID) | INTRAMUSCULAR | Status: DC | PRN
  Administered 2020-03-11: 4 mg via INTRAVENOUS

## 2020-03-05 MED ORDER — ACETAMINOPHEN 650 MG RE SUPP: 650.0000 mg | Freq: Four times a day (QID) | RECTAL | Status: DC | PRN

## 2020-03-05 MED ORDER — SODIUM CHLORIDE 0.9 % IV SOLN: INTRAVENOUS | Status: AC

## 2020-03-05 MED ORDER — ACETAMINOPHEN 325 MG PO TABS
650.0000 mg | ORAL_TABLET | Freq: Four times a day (QID) | ORAL | Status: DC | PRN
  Administered 2020-03-10 – 2020-03-13 (×2): 650 mg via ORAL
  Filled 2020-03-05 (×3): qty 2

## 2020-03-05 MED ORDER — NEPRO/CARBSTEADY PO LIQD
237.0000 mL | Freq: Two times a day (BID) | ORAL | Status: DC
  Administered 2020-03-05 – 2020-03-06 (×2): 237 mL via ORAL
  Filled 2020-03-05: qty 237

## 2020-03-05 MED ORDER — SODIUM ZIRCONIUM CYCLOSILICATE 10 G PO PACK
10.0000 g | PACK | Freq: Once | ORAL | Status: AC
  Administered 2020-03-05: 10 g via ORAL
  Filled 2020-03-05: qty 1

## 2020-03-05 MED ORDER — ONDANSETRON HCL 4 MG PO TABS: 4.0000 mg | ORAL_TABLET | Freq: Four times a day (QID) | ORAL | Status: DC | PRN

## 2020-03-05 MED ORDER — FENTANYL CITRATE (PF) 100 MCG/2ML IJ SOLN
25.0000 ug | INTRAMUSCULAR | Status: DC | PRN
Start: 2020-03-05 — End: 2020-03-14
  Administered 2020-03-05 – 2020-03-12 (×15): 25 ug via INTRAVENOUS
  Filled 2020-03-05 (×15): qty 2

## 2020-03-05 MED ORDER — HEPARIN SODIUM (PORCINE) 5000 UNIT/ML IJ SOLN
5000.0000 [IU] | Freq: Three times a day (TID) | INTRAMUSCULAR | Status: DC
  Administered 2020-03-05 – 2020-03-14 (×26): 5000 [IU] via SUBCUTANEOUS
  Filled 2020-03-05 (×26): qty 1

## 2020-03-05 MED ORDER — VANCOMYCIN VARIABLE DOSE PER UNSTABLE RENAL FUNCTION (PHARMACIST DOSING): Status: DC

## 2020-03-05 MED ORDER — HYDROXYZINE HCL 10 MG PO TABS
10.0000 mg | ORAL_TABLET | Freq: Four times a day (QID) | ORAL | Status: DC | PRN
  Administered 2020-03-09: 10 mg via ORAL
  Filled 2020-03-05: qty 1

## 2020-03-05 MED ORDER — SODIUM ZIRCONIUM CYCLOSILICATE 5 G PO PACK
15.0000 g | PACK | Freq: Every day | ORAL | Status: DC
  Filled 2020-03-05: qty 1

## 2020-03-05 MED ORDER — HYDROCORTISONE NA SUCCINATE PF 100 MG IJ SOLR
50.0000 mg | Freq: Four times a day (QID) | INTRAMUSCULAR | Status: DC
  Administered 2020-03-05 – 2020-03-08 (×14): 50 mg via INTRAVENOUS
  Filled 2020-03-05 (×14): qty 2

## 2020-03-05 MED ORDER — CLINDAMYCIN PHOSPHATE 600 MG/50ML IV SOLN
600.0000 mg | Freq: Three times a day (TID) | INTRAVENOUS | Status: AC
  Administered 2020-03-05 – 2020-03-07 (×9): 600 mg via INTRAVENOUS
  Filled 2020-03-05 (×9): qty 50

## 2020-03-05 MED ORDER — INSULIN ASPART 100 UNIT/ML ~~LOC~~ SOLN
0.0000 [IU] | Freq: Three times a day (TID) | SUBCUTANEOUS | Status: DC
  Administered 2020-03-05 – 2020-03-06 (×3): 1 [IU] via SUBCUTANEOUS
  Administered 2020-03-06: 2 [IU] via SUBCUTANEOUS
  Administered 2020-03-07: 1 [IU] via SUBCUTANEOUS
  Administered 2020-03-07: 2 [IU] via SUBCUTANEOUS
  Administered 2020-03-07: 1 [IU] via SUBCUTANEOUS
  Administered 2020-03-07 (×2): 2 [IU] via SUBCUTANEOUS
  Administered 2020-03-08: 1 [IU] via SUBCUTANEOUS
  Administered 2020-03-08 (×2): 2 [IU] via SUBCUTANEOUS
  Administered 2020-03-08 – 2020-03-10 (×3): 1 [IU] via SUBCUTANEOUS
  Administered 2020-03-11: 3 [IU] via SUBCUTANEOUS

## 2020-03-05 MED ORDER — SODIUM CHLORIDE 0.9 % IV SOLN
1.0000 g | INTRAVENOUS | Status: DC
  Administered 2020-03-05 – 2020-03-08 (×4): 1 g via INTRAVENOUS
  Filled 2020-03-05 (×4): qty 1

## 2020-03-05 MED ORDER — CALCIUM CARBONATE ANTACID 500 MG PO CHEW: 1.0000 | CHEWABLE_TABLET | Freq: Three times a day (TID) | ORAL | Status: DC | PRN

## 2020-03-05 MED ORDER — IPRATROPIUM-ALBUTEROL 0.5-2.5 (3) MG/3ML IN SOLN: 3.0000 mL | RESPIRATORY_TRACT | Status: DC | PRN

## 2020-03-05 MED ORDER — PANTOPRAZOLE SODIUM 20 MG PO TBEC
20.0000 mg | DELAYED_RELEASE_TABLET | Freq: Every day | ORAL | Status: DC
  Administered 2020-03-05 – 2020-03-14 (×10): 20 mg via ORAL
  Filled 2020-03-05 (×11): qty 1

## 2020-03-05 MED ORDER — ONDANSETRON 4 MG PO TBDP
ORAL_TABLET | ORAL | Status: AC
  Filled 2020-03-05: qty 1

## 2020-03-05 MED ORDER — GABAPENTIN 100 MG PO CAPS
200.0000 mg | ORAL_CAPSULE | Freq: Every day | ORAL | Status: DC
  Administered 2020-03-05 – 2020-03-13 (×9): 200 mg via ORAL
  Filled 2020-03-05 (×9): qty 2

## 2020-03-05 MED ORDER — ZINC OXIDE 40 % EX OINT
TOPICAL_OINTMENT | Freq: Two times a day (BID) | CUTANEOUS | Status: DC
  Administered 2020-03-12: 1 via TOPICAL
  Filled 2020-03-05: qty 57

## 2020-03-05 MED ORDER — CHLORHEXIDINE GLUCONATE CLOTH 2 % EX PADS
6.0000 | MEDICATED_PAD | Freq: Every day | CUTANEOUS | Status: DC
  Administered 2020-03-05 – 2020-03-14 (×10): 6 via TOPICAL

## 2020-03-05 MED ORDER — ATORVASTATIN CALCIUM 40 MG PO TABS
40.0000 mg | ORAL_TABLET | Freq: Every day | ORAL | Status: DC
  Administered 2020-03-05 – 2020-03-14 (×10): 40 mg via ORAL
  Filled 2020-03-05 (×6): qty 1
  Filled 2020-03-05: qty 4
  Filled 2020-03-05 (×3): qty 1

## 2020-03-05 MED ORDER — SODIUM BICARBONATE 650 MG PO TABS
1300.0000 mg | ORAL_TABLET | Freq: Two times a day (BID) | ORAL | Status: DC
  Administered 2020-03-05 – 2020-03-14 (×19): 1300 mg via ORAL
  Filled 2020-03-05 (×21): qty 2

## 2020-03-05 MED ORDER — VANCOMYCIN HCL 1250 MG/250ML IV SOLN
1250.0000 mg | Freq: Once | INTRAVENOUS | Status: AC
  Administered 2020-03-05: 1250 mg via INTRAVENOUS
  Filled 2020-03-05: qty 250

## 2020-03-05 NOTE — H&P (Addendum)
History and Physical    Curtis Solis [id number] DOB: 1955-03-31 DOA: 03/04/2020  PCP: [name], MD  Patient coming from: SNF via EMS skilled nursing facility   Chief Complaint:  Chief Complaint  Patient presents with  . Abnormal Lab     HPI:    65 year old male with past medical history of diabetes mellitus type 2 (now diet controlled), disease stage IV, severe protein calorie malnutrition (S/P PEG), hypertension, chronic urinary retention with indwelling Foley catheter presented to [facility] emergency department from [facility] SNF via EMS for abnormal labs including recurrent hyponatremia, hyperkalemia and elevated creatinine.  Of note, patient has a complicated clinical course particularly since 09/2019 during a [facility] hospitalization for AAA repair.  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  Patient also suffered from severe acute kidney injury requiring tunneled hemodialysis catheter placement and CRRT for a period of time.   In 10/2019 patient also unfortunately suffered mesenteric ischemia status post small bowel resection status post ileostomy placement.  Patient suffered a cardiac arrest around that time secondary to cardiac tamponade.  Patient was intubated and required pericardiocentesis with drain placement on 10/22/2019.  Patient additionally developed necrosis of the foreskin of the penis with urinary retention requiring blunt dissection and placement of a Foley catheter.  Patient eventually recovered, was extubated and was able to be transitioned off of renal replacement therapy in November  with a new baseline creatinine of approximately 3 (CKD IV).  Patient was discharged to [facility] 11/25/2019.  Patient was eventually transferred to medical group SNF in December.  After an attempted voiding trial in January patient had to be hospitalized at [facility] 1/21 for acute kidney injury ( Cr  10.9) and hyperkalemia requiring replacement of the Foley catheter.  Renal injury improved and patient was eventually discharged from [facility] 1/27.  Patient was then hospitalized at [facility] health from 2/10 until 2/15.  Patient was initially hospitalized for recurrent hyperkalemia with potassium was high 7.5.  Hyperkalemia was able to be treated medically without need for hemodialysis.  Patient was identified to have extensive right foot/toe wounds with concern for vascular etiology.  Patient was evaluated by vascular surgery on 2/10.  ABIs were unremarkable and it was advised conservative management be pursued with wound care.  With renal function improvement patient was eventually discharged on 2/15 back to [facility] skilled nursing facility.  Since discharge, patient states that he is continued to experience weakness but feels that his strength is improving day by day.  Patient states that his oral intake is improving as well.  Patient denies cough, shortness of breath, fevers, chest pain, pain or sick contacts.  Patient states that he feels that his various wounds are improving.  Patient underwent routine blood work by his SNF which revealed recurrent hyperkalemia, hyponatremia and renal injury and therefore the patient was sent to [facility] emergency department for repeat evaluation.  Upon evaluation in the emergency department patient was confirmed to have a creatinine of 10.0 BUN of 84, mild hyperkalemia 5.2 with hyponatremia of 126.  Patient was also found to be hypotensive with initial systolic blood pressures in the 80s.  Lactic acid was found to be unremarkable however.  Patient was evaluated by nephrology who felt that the patient may be suffering from prerenal injury.  Bladder scan and bilateral renal ultrasound performed the emergency department revealed no evidence of outlet obstruction.  The  recommended intravenous hydration.  They also brought up clinical concern for  Addison's disease and recommended obtaining a cortisol.  30 cc/kg of intravenous normal saline was ordered.  50 mg of intravenous hydrocortisone was administered in the emergency department as well.  The hospitalist group was then called to assess the patient for admission to the hospital.   review of Systems:   Review of Systems  Constitutional: Positive for malaise/fatigue.  Neurological: Positive for weakness.  All other systems reviewed and are negative.   Past Medical History:  Diagnosis Date  . A-fib [facility])    During hospital stay  . CKD (chronic kidney disease)   . Hyperlipidemia   . Hyperthyroidism   . PVD (peripheral vascular disease) ([place])     Past Surgical History:  Procedure Laterality Date  . ABDOMINAL AORTIC ANEURYSM REPAIR    . ABOVE KNEE LEG AMPUTATION Left   . BOWEL RESECTION     for dead bowel, s/p open abdomen  . COLOSTOMY    . GASTROSTOMY    . left leg amputation    . PERICARDIAL FLUID DRAINAGE     Placed for tamponade and removed during the [facility] hospitalization     reports that he quit smoking about 5 months ago. He has never used smokeless tobacco. He reports current alcohol use. No history on file for drug use.  No Known Allergies  Family History  Problem Relation Age of Onset  . Heart disease Other      Prior to Admission medications   Medication Sig Start Date End Date Taking? Authorizing Provider  atorvastatin (LIPITOR) 40 MG tablet Take 40 mg by mouth daily.    [provider]  calcium carbonate (TUMS - DOSED IN MG ELEMENTAL CALCIUM) 500 MG chewable tablet Chew 1 tablet by mouth every 8 (eight) hours as needed for indigestion or heartburn.    [provider]  diphenoxylate-atropine (LOMOTIL) 2.5-0.025 MG tablet Take 1 tablet by mouth every 6 (six) hours. 6 AM, 12 noon, 6 PM and 12 AM    [provider]  gabapentin (NEURONTIN) 100 MG capsule Take 100 mg by mouth at bedtime.    [provider]  hydrOXYzine  (ATARAX/VISTARIL) 10 MG tablet Take 10 mg by mouth every 6 (six) hours as needed for anxiety.    [provider]  ipratropium-albuterol (DUONEB) 0.5-2.5 (3) MG/3ML SOLN Take 3 mLs by nebulization every 6 (six) hours as needed (wheezing/ cough).    [provider]  loperamide (IMODIUM A-D) 2 MG tablet Take 2 mg by mouth 4 (four) times daily as needed for diarrhea or loose stools.    [provider]  metoprolol tartrate (LOPRESSOR) 25 MG tablet Take 12.5 mg by mouth 2 (two) times daily. 9 AM and 9 PM    [provider]  Nutritional Supplements (FEEDING SUPPLEMENT, OSMOLITE 1.5 CAL,) LIQD Place 780 mLs into feeding tube continuous. 02/05/20   Charlynne Cousins, MD  Nutritional Supplements (FEEDING SUPPLEMENT, PROSOURCE TF,) liquid Place 45 mLs into feeding tube 3 (three) times daily. 02/05/20   Charlynne Cousins, MD  oxyCODONE (ROXICODONE) 5 MG/5ML solution Take 5 mLs (5 mg total) by mouth every 4 (four) hours as needed (pain). 02/05/20   Charlynne Cousins, MD  pantoprazole (PROTONIX) 20 MG tablet Take 20 mg by mouth daily.    [provider]  simethicone (MYLICON) 80 MG chewable tablet Chew 80 mg by mouth every 8 (eight) hours as needed for flatulence.    [provider]  sodium bicarbonate 650 MG tablet Take 2 tablets (1,300 mg total) by mouth 2 (two) times daily. 02/05/20   Charlynne Cousins, MD  traMADol (ULTRAM) 50 MG tablet Take 50 mg by mouth every 8 (eight) hours. 6 AM, 2 PM, 10 PM    [provider]  Water For Irrigation, Sterile (FREE WATER) SOLN Place 300 mLs into feeding tube every 4 (four) hours. 02/05/20   Charlynne Cousins, MD    Physical Exam: Vitals:   03/04/20 2019 03/04/20 2134 03/04/20 2222 03/04/20 2317  BP: (!) 84/53 (!) 98/52 (!) 88/54 (!) 92/56  Pulse: 87 85 94 93  Resp: '16 16 16 16  '$ Temp:      TempSrc:      SpO2: 100% 100% 99% 100%  Weight:      Height:        Constitutional: Acute alert and  oriented x3, no associated distress.  Patient is cachectic. Skin: Anterior abdominal wall wound with dressing slightly soiled but dry and intact.  Right foot reveals multiple necrotic toes with maceration of the skin in between the toes and some serous drainage without foul smell.  Notable right hip wound with dressing clean dry and intact. Good skin turgor noted. Eyes: Pupils are equally reactive to light.  No evidence of scleral icterus or conjunctival pallor.  ENMT: Slightly dry mucous membranes noted.  Posterior pharynx clear of any exudate or lesions.  Neck: normal, supple, no masses, no thyromegaly.  No evidence of jugular venous distension.   Respiratory: clear to auscultation bilaterally, no wheezing, no crackles. Normal respiratory effort. No accessory muscle use.  Cardiovascular: Regular rate and rhythm, no murmurs / rubs / gallops. No extremity edema. 2+ pedal pulses. No carotid bruits.  Chest:   Nontender without crepitus or deformity.   Back:   Nontender without crepitus or deformity. Abdomen: Notable ileostomy in place with brown moderate consistency stool output without blood.  Pink mucosa noted.  Abdomen is soft and nontender.  No evidence of intra-abdominal masses.  Positive bowel sounds noted in all quadrants.   Musculoskeletal: Extremely poor muscle tone noted.  No joint deformity upper and lower extremities. Good ROM, no contractures.  Neurologic: CN 2-12 grossly intact. Sensation intact.  Patient moving all 4 extremities spontaneously.  Patient is following all commands.  Patient is responsive to verbal stimuli.   Psychiatric: Patient exhibits normal mood with flat affect.  Patient seems to possess insight as to their current situation.     Labs on Admission: I have personally reviewed following labs and imaging studies -   CBC: Recent Labs  Lab 03/04/20 1821  WBC 12.6*  HGB 7.7*  HCT 24.8*  MCV 90.8  PLT [id number]   Basic Metabolic Panel: Recent Labs  Lab 03/04/20 1821   NA 126*  K 5.2*  CL 89*  CO2 22  GLUCOSE 106*  BUN 84*  CREATININE 10.00*  CALCIUM 9.7   GFR: Estimated Creatinine Clearance: 6 mL/min (A) (by C-G formula based on SCr of 10 mg/dL (H)). Liver Function Tests: No results for input(s): AST, ALT, ALKPHOS, BILITOT, PROT, ALBUMIN in the last 168 hours. No results for input(s): LIPASE, AMYLASE in the last 168 hours. No results for input(s): AMMONIA in the last 168 hours. Coagulation Profile: No results for input(s): INR, PROTIME in the last 168 hours. Cardiac Enzymes: Recent Labs  Lab 03/04/20 1954  CKTOTAL 26*   BNP (last 3 results) No results for input(s): PROBNP in the last 8760 hours.  HbA1C: No results for input(s): HGBA1C in the last 72 hours. CBG: No results for input(s): GLUCAP in the last 168 hours. Lipid Profile: No results for input(s): CHOL, HDL, LDLCALC, TRIG, CHOLHDL, LDLDIRECT in the last 72 hours. Thyroid Function Tests: No results for input(s): TSH, T4TOTAL, FREET4, T3FREE, THYROIDAB in the last 72 hours. Anemia Panel: No results for input(s): VITAMINB12, FOLATE, FERRITIN, TIBC, IRON, RETICCTPCT in the last 72 hours. Urine analysis:    Component Value Date/Time   COLORURINE AMBER (A) 03/04/2020 1911   APPEARANCEUR CLOUDY (A) 03/04/2020 1911   LABSPEC 1.018 03/04/2020 1911   PHURINE 5.0 03/04/2020 1911   GLUCOSEU NEGATIVE 03/04/2020 1911   HGBUR MODERATE (A) 03/04/2020 1911   BILIRUBINUR NEGATIVE 03/04/2020 1911   KETONESUR NEGATIVE 03/04/2020 1911   PROTEINUR 30 (A) 03/04/2020 1911   NITRITE NEGATIVE 03/04/2020 1911   LEUKOCYTESUR LARGE (A) 03/04/2020 1911    Radiological Exams on Admission - Personally Reviewed: DG Chest 1 View  Result Date: 03/04/2020 CLINICAL DATA:  Abnormal labs and hypotension EXAM: CHEST  1 VIEW COMPARISON:  None. FINDINGS: The heart size and mediastinal contours are within normal limits. Aortic atherosclerosis. Both lungs are clear. The visualized skeletal structures are  unremarkable. IMPRESSION: No active disease. Electronically Signed   By: Donavan Foil M.D.   On: 03/04/2020 23:53   US RENAL  Result Date: 03/04/2020 CLINICAL DATA:  Acute renal disease. EXAM: RENAL / URINARY TRACT ULTRASOUND COMPLETE COMPARISON:  None. FINDINGS: Right Kidney: Renal measurements: 10.9 cm x 4.4 cm x 4.3 cm = volume: 106.26 mL. Echogenicity within normal limits. A 3.3 cm x 2.8 cm x 2.4 cm anechoic structure is seen within the upper pole of the right kidney. No abnormal flow is seen within this region on color Doppler evaluation. No hydronephrosis is visualized. Left Kidney: Renal measurements: 11.0 cm x 5.4 cm x 4.2 cm = volume: 130.80 mL. Echogenicity within normal limits. 2.8 cm x 2.0 cm x 2.1 cm and 1.8 cm x 1.5 cm x 1.7 cm anechoic structures are seen within the lower pole of the left kidney. An additional 2.1 cm x 1.9 cm x 2.5 cm anechoic structure is seen within the upper pole of the left kidney. No abnormal flow is seen within these regions on color Doppler evaluation. No hydronephrosis is visualized. Bladder: Limited in evaluation secondary to the presence of overlying bandages. Other: Of incidental note is the presence of a 4.3 cm hypoechoic area seen in between the spleen and left kidney. This area could not be duplicated clearly in transverse plane, as per the ultrasound technologist. IMPRESSION: Multiple bilateral simple renal cysts. Electronically Signed   By: Virgina Norfolk M.D.   On: 03/04/2020 22:39    EKG: Personally reviewed.  Rhythm is normal sinus rhythm with heart rate of 92 bpm.  Evidence of LVH.  No dynamic ST segment changes appreciated.  Assessment/Plan Active Problems:   Acute renal failure with acute tubular necrosis superimposed on stage 4 chronic kidney disease ([place])   Patient presenting with evidence of severe acute kidney injury superimposed on chronic kidney disease stage IV  Bladder scan and renal ultrasound revealed no evidence of bladder outlet  obstruction or hydronephrosis.  Urinalysis suggestive of urinary tract infection however patient is afebrile without abdominal pain.  Renal injury likely secondary to volume depletion  Hydrating patient with intravenous fluids  Strict input and output monitoring  Monitoring renal function and electrolytes with serial chemistries  Avoiding nephrotoxic agents if at all possible  Hypotension  Patient exhibiting substantial hypotension with systolic blood pressures range between 80s and 90s here in the emergency department  Patient denies lightheadedness  Lactic acid normal  [facility] health chart reviewed, patient additionally exhibited blood pressures with systolics averaging in the 90s throughout that entire hospitalization as well.  During that hospitalization there was concern for "hypoaldosteronism" with work-up resulting after discharge revealing low renin and aldosterone levels -strangely enough I cannot identify that a cortisol level was obtained  Patient's presentation is seemingly consistent with adrenal insufficiency/Addison's disease  Obtaining cortisol level, will try trial of 50 mg of intravenous hydrocortisone every 6 hrs  Meanwhile will perform work-up for any underlying sources of infection including blood cultures, urine cultures, chest x-ray.  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  Patient would benefit from vascular surgery consultation in the morning for reevaluation of the foot as I believe it has likely progressed since vascular surgery at [place] on 2/15.  Obtaining right foot x-ray  Chronic urinary retention complicated by open wound of penis   Patient has a complicated history of bladder outlet obstruction the patient has been suffering from since October 2021  Patient failed voiding trial in January 2022 and at that time Foley catheter was replaced  Placing wound care consultation for continued care of wound of penis  It has been approximately 1 month since original placement of his Foley catheter  During recent hospitalization at [facility] catheter exchanges due to fear for difficult reinsertion  Will touch base with urology in the morning about replacement of Foley catheter    Hyponatremia   Mild recurrent hyponatremia  Patient has suffered from recurrent hyponatremia in the past several months  Possibly hypovolemic hyponatremia  Nephrology has already ordered a hyponatremia work-up which we will follow  Hydrating patient with intravenous isotonic fluids  Performing serial chemistries to monitor sodium levels    Hyperkalemia   Mild hyperkalemia likely secondary to degree of renal injury  Review of notes from [place] reveal the patient is supposed to be on Lokelma 15 g daily which will be resumed here  I suspect that with aggressive intravenous hydration and improvement in renal injury hyperkalemia will resolve on its own  Monitoring patient on telemetry    Ileostomy present [facility])   Some leakage of ileostomy bag noted on examination  Nursing to replace the ostomy bag as  soon as possible  Wound care/ileostomy consultation placed    Open abdominal wall wound, subsequent encounter   Unable to evaluate wound due to patient going to x-ray, will attempt to come back later to evaluate.  Continuing aggressive wound care  Wound care consultation placed    Severe protein-calorie malnutrition [facility])   Patient exhibits longstanding ongoing poor  oral intake for several months with evidence of BMI of 17.16, poor muscle tone and temporal wasting  Reordering recently prescribed regimen of Nepro twice daily in between meals  Encouraging oral intake  Nutrition consultation placed     GERD without esophagitis   Continue home regimen of daily PPI    Controlled type 2 diabetes mellitus with stage 4 chronic kidney disease, without long-term current use of insulin ([place])    Patient has longstanding known history of diabetes  Patient is no longer requiring treatment however due to substantial weight loss  We will continue Accu-Cheks before every meal and nightly with low intensity sliding scale insulin   Code Status:  Full code Family Communication: Care plan discussed with patient's wife via phone conversation  Status is: Inpatient  Remains inpatient appropriate because:Ongoing diagnostic testing needed not appropriate for outpatient work up, IV treatments appropriate due to intensity of illness or inability to take PO and Inpatient level of care appropriate due to severity of illness   Dispo: The patient is from: SNF              Anticipated d/c is to: SNF              Anticipated d/c date is: > 3 days              Patient currently is not medically stable to d/c.   Difficult to place patient No        Vernelle Emerald MD [facility] Pager [phone]  If 7PM-7AM, please contact night-coverage [email or web address] Use universal [place] password for that web site. If you do not have the password, please call the hospital  operator.  03/05/2020, 12:28 AM

## 2020-03-05 NOTE — ED Notes (Signed)
Wound care at bedside.

## 2020-03-05 NOTE — ED Notes (Signed)
SDU Breakfast order placed 

## 2020-03-05 NOTE — ED Notes (Signed)
Patient is resting comfortably. 

## 2020-03-05 NOTE — ED Notes (Signed)
RN called unit to see if room is clean

## 2020-03-05 NOTE — Consult Note (Signed)
Reason for Consult:Right foot ulcers Referring Physician: L Pokhrel Time called: [id number] Time at bedside: [place]   Curtis Solis is an 65 y.o. male.  HPI: Curtis Solis was admitted from the SNF where he was residing 2/2 abnormal lab work. He denies any problems at this time though his responses seem delayed and sometimes nonsensical (for example he told me 69 old ladies did his amputation). He says his feet have been that way for some time but don't bother him. He was recently an inpatient at [facility] and vascular workup was unremarkable. MRI here showed osteo of his toes and orthopedic surgery was consulted.  Past Medical History:  Diagnosis Date  . A-fib [facility])    During hospital stay  . CKD (chronic kidney disease)   . Hyperlipidemia   . Hyperthyroidism   . PVD (peripheral vascular disease) ([place])     Past Surgical History:  Procedure Laterality Date  . ABDOMINAL AORTIC ANEURYSM REPAIR    . ABOVE KNEE LEG AMPUTATION Left   . BOWEL RESECTION     for dead bowel, s/p open abdomen  . COLOSTOMY    . GASTROSTOMY    . left leg amputation    . PERICARDIAL FLUID DRAINAGE     Placed for tamponade and removed during the [facility] hospitalization    Family History  Problem Relation Age of Onset  . Heart disease Other     Social History:  reports that he quit smoking about 5 months ago. He has never used smokeless tobacco. He reports current alcohol use. No history on file for drug use.  Allergies: No Known Allergies  Medications: I have reviewed the patient's current medications.  Results for orders placed or performed during the hospital encounter of 03/04/20 (from the past 48 hour(s))  CBC     Status: Abnormal   Collection Time: 03/04/20  6:21 PM  Result Value Ref Range   WBC 12.6 (H) 4.0 - 10.5 K/uL   RBC 2.73 (L) 4.22 - 5.81 MIL/uL   Hemoglobin 7.7 (L) 13.0 - 17.0 g/dL   HCT 24.8 (L) 39.0 - 52.0 %   MCV 90.8 80.0 - 100.0 fL   MCH 28.2 26.0 - 34.0 pg   MCHC 31.0 30.0 - 36.0 g/dL   RDW 17.5  (H) 11.5 - 15.5 %   Platelets 284 150 - 400 K/uL   nRBC 0.0 0.0 - 0.2 %    Comment: Performed at [facility], [address] [place], [place] [id number]  Basic metabolic panel     Status: Abnormal   Collection Time: 03/04/20  6:21 PM  Result Value Ref Range   Sodium 126 (L) 135 - 145 mmol/L   Potassium 5.2 (H) 3.5 - 5.1 mmol/L   Chloride 89 (L) 98 - 111 mmol/L   CO2 22 22 - 32 mmol/L   Glucose, Bld 106 (H) 70 - 99 mg/dL    Comment: Glucose reference range applies only to samples taken after fasting for at least 8 hours.   BUN 84 (H) 8 - 23 mg/dL   Creatinine, Ser 10.00 (H) 0.61 - 1.24 mg/dL   Calcium 9.7 8.9 - 10.3 mg/dL   GFR, Estimated 5 (L) >60 mL/min    Comment: (NOTE) Calculated using the CKD-EPI Creatinine Equation (2021)    Anion gap 15 5 - 15    Comment: Performed at [facility] [address] [place], [place] [address]  Blood culture (routine x 2)     Status: None (Preliminary  result)   Collection Time: 03/04/20  7:05 PM   Specimen: BLOOD  Result Value Ref Range   Specimen Description BLOOD SITE NOT SPECIFIED    Special Requests      BOTTLES DRAWN AEROBIC AND ANAEROBIC Blood Culture results may not be optimal due to an inadequate volume of blood received in culture bottles   Culture      NO GROWTH < 12 HOURS Performed at [facility] [address] [place], [place] [address]    Report Status PENDING   Blood culture (routine x 2)     Status: None (Preliminary result)   Collection Time: 03/04/20  7:10 PM   Specimen: BLOOD  Result Value Ref Range   Specimen Description BLOOD BLOOD LEFT FOREARM    Special Requests      BOTTLES DRAWN AEROBIC AND ANAEROBIC Blood Culture results may not be optimal due to an inadequate volume of blood received in culture bottles   Culture      NO GROWTH < 12 HOURS Performed at [facility], [place] [address] [place], [place] [address]    Report Status PENDING   Urinalysis, Routine w reflex microscopic Urine,  Catheterized     Status: Abnormal   Collection Time: 03/04/20  7:11 PM  Result Value Ref Range   Color, Urine AMBER (A) YELLOW    Comment: BIOCHEMICALS MAY BE AFFECTED BY COLOR   APPearance CLOUDY (A) CLEAR   Specific Gravity, Urine 1.018 1.005 - 1.030   pH 5.0 5.0 - 8.0   Glucose, UA NEGATIVE NEGATIVE mg/dL   Hgb urine dipstick MODERATE (A) NEGATIVE   Bilirubin Urine NEGATIVE NEGATIVE   Ketones, ur NEGATIVE NEGATIVE mg/dL   Protein, ur 30 (A) NEGATIVE mg/dL   Nitrite NEGATIVE NEGATIVE   Leukocytes,Ua LARGE (A) NEGATIVE   RBC / HPF >50 (H) 0 - 5 RBC/hpf   WBC, UA >50 (H) 0 - 5 WBC/hpf   Bacteria, UA FEW (A) NONE SEEN   Squamous Epithelial / LPF 0-5 0 - 5   WBC Clumps PRESENT    Mucus PRESENT    Hyaline Casts, UA PRESENT     Comment: Performed at [facility], [place] [address] [place], [place] [address]  CK     Status: Abnormal   Collection Time: 03/04/20  7:54 PM  Result Value Ref Range   Total CK 26 (L) 49 - 397 U/L    Comment: Performed at [facility], [place] [address], [place], [place] [address]  SARS CORONAVIRUS 2 (TAT 6-24 HRS) Nasopharyngeal Urine, Catheterized     Status: None   Collection Time: 03/04/20  7:54 PM   Specimen: Urine, Catheterized; Nasopharyngeal  Result Value Ref Range   SARS Coronavirus 2 NEGATIVE NEGATIVE    Comment: (NOTE) SARS-CoV-2 target nucleic acids are NOT DETECTED.  The SARS-CoV-2 RNA is generally detectable in upper and lower respiratory specimens during the acute phase of infection. Negative results do not preclude SARS-CoV-2 infection, do not rule out co-infections with other pathogens, and should not be used as the sole basis for treatment or other patient management decisions. Negative results must be combined with clinical observations, patient history, and epidemiological information. The expected result is Negative.  Fact Sheet for Patients: SugarRoll.be  Fact Sheet for Healthcare  Providers: https://www.woods-mathews.com/  This test is not yet approved or cleared by the [place] FDA and  has been authorized for detection and/or diagnosis of SARS-CoV-2 by FDA under an Emergency Use  Authorization (EUA). This EUA will remain  in effect (meaning this test can be used) for the duration of the COVID-19 declaration under Se ction 564(b)(1) of the Act, 21 U.S.C. section 360bbb-3(b)(1), unless the authorization is terminated or revoked sooner.  Performed at [facility], [place] [address] [place], [place] [address]   Osmolality, urine     Status: None   Collection Time: 03/04/20  8:21 PM  Result Value Ref Range   Osmolality, Ur 302 300 - 900 mOsm/kg    Comment: REPEATED TO VERIFY Performed at [facility], [place] [address] [place], [place] [address]   Sodium, urine, random     Status: None   Collection Time: 03/04/20  8:21 PM  Result Value Ref Range   Sodium, Ur 11 mmol/L    Comment: Performed at [facility] [address], [place], [place] [address]  Lactic acid, plasma     Status: None   Collection Time: 03/04/20  8:39 PM  Result Value Ref Range   Lactic Acid, Venous 1.1 0.5 - 1.9 mmol/L    Comment: Performed at [facility] [address], [place], [place] [address]  Aerobic Culture w Gram Stain (superficial specimen)     Status: None (Preliminary result)   Collection Time: 03/05/20  3:03 AM   Specimen: Wound  Result Value Ref Range   Specimen Description WOUND STOMACH    Special Requests NONE    Gram Stain      RARE WBC PRESENT, PREDOMINANTLY PMN MODERATE GRAM POSITIVE COCCI Performed at [facility], [place] [address], [place], [place] [address]    Culture PENDING    Report Status PENDING   Cortisol     Status: None   Collection Time: 03/05/20  4:20 AM  Result Value Ref Range   Cortisol, Plasma 23.6 ug/dL    Comment: (NOTE) AM    6.7 - 22.6 ug/dL PM   <10.0       ug/dL Performed at [facility] [address] [place], [place] [address]   Renal function panel     Status: Abnormal   Collection Time: 03/05/20  4:20 AM  Result Value Ref Range   Sodium 130 (L) 135 - 145 mmol/L   Potassium 5.6 (H) 3.5 - 5.1 mmol/L   Chloride 95 (L) 98 - 111 mmol/L   CO2 20 (L) 22 - 32 mmol/L   Glucose, Bld 138 (H) 70 - 99 mg/dL    Comment: Glucose reference range applies only to samples taken after fasting for at least 8 hours.   BUN 84 (H) 8 - 23 mg/dL   Creatinine, Ser 9.29 (H) 0.61 - 1.24 mg/dL   Calcium 9.4 8.9 - 10.3 mg/dL   Phosphorus 8.2 (H) 2.5 - 4.6 mg/dL   Albumin 2.4 (L) 3.5 - 5.0 g/dL   GFR, Estimated 6 (L) >60 mL/min    Comment: (NOTE) Calculated using the CKD-EPI Creatinine Equation (2021)    Anion gap 15 5 - 15    Comment: Performed at [facility] [address] [place], [place] [address]  Hemoglobin A1c     Status: None   Collection Time: 03/05/20  4:20 AM  Result Value Ref Range   Hgb A1c MFr Bld 5.1 4.8 - 5.6 %    Comment: (NOTE) Pre diabetes:          5.7%-6.4%  Diabetes:              >  6.4%  Glycemic control for   <7.0% adults with diabetes    Mean Plasma Glucose 99.67 mg/dL    Comment: Performed at [facility] [address] [place], [place] [address]  Protime-INR     Status: None   Collection Time: 03/05/20  4:20 AM  Result Value Ref Range   Prothrombin Time 15.0 11.4 - 15.2 seconds   INR 1.2 0.8 - 1.2    Comment: (NOTE) INR goal varies based on device and disease states. Performed at [facility], [place] [address] [place], [place] [address]   Cortisol-am, blood     Status: Abnormal   Collection Time: 03/05/20  4:20 AM  Result Value Ref Range   Cortisol - AM 24.5 (H) 6.7 - 22.6 ug/dL    Comment: Performed at [facility] [address] [place], [place] [address]  Procalcitonin     Status: None   Collection Time: 03/05/20  4:20 AM  Result Value Ref Range   Procalcitonin 0.34 ng/mL    Comment:        Interpretation: PCT  (Procalcitonin) <= 0.5 ng/mL: Systemic infection (sepsis) is not likely. Local bacterial infection is possible. (NOTE)       Sepsis PCT Algorithm           Lower Respiratory Tract                                      Infection PCT Algorithm    ----------------------------     ----------------------------         PCT < 0.25 ng/mL                PCT < 0.10 ng/mL          Strongly encourage             Strongly discourage   discontinuation of antibiotics    initiation of antibiotics    ----------------------------     -----------------------------       PCT 0.25 - 0.50 ng/mL            PCT 0.10 - 0.25 ng/mL               OR       >80% decrease in PCT            Discourage initiation of                                            antibiotics      Encourage discontinuation           of antibiotics    ----------------------------     -----------------------------         PCT >= 0.50 ng/mL              PCT 0.26 - 0.50 ng/mL               AND        <80% decrease in PCT             Encourage initiation of  antibiotics       Encourage continuation           of antibiotics    ----------------------------     -----------------------------        PCT >= 0.50 ng/mL                  PCT > 0.50 ng/mL               AND         increase in PCT                  Strongly encourage                                      initiation of antibiotics    Strongly encourage escalation           of antibiotics                                     -----------------------------                                           PCT <= 0.25 ng/mL                                                 OR                                        > 80% decrease in PCT                                      Discontinue / Do not initiate                                             antibiotics  Performed at [facility], [address], [place], [place] [address]   Magnesium      Status: None   Collection Time: 03/05/20  4:20 AM  Result Value Ref Range   Magnesium 2.0 1.7 - 2.4 mg/dL    Comment: Performed at [facility] [address] [place], [place] [address]  CBC WITH DIFFERENTIAL     Status: Abnormal   Collection Time: 03/05/20  4:20 AM  Result Value Ref Range   WBC 11.0 (H) 4.0 - 10.5 K/uL   RBC 2.52 (L) 4.22 - 5.81 MIL/uL   Hemoglobin 7.3 (L) 13.0 - 17.0 g/dL   HCT 23.4 (L) 39.0 - 52.0 %   MCV 92.9 80.0 - 100.0 fL   MCH 29.0 26.0 - 34.0 pg   MCHC 31.2 30.0 - 36.0 g/dL   RDW 17.4 (H) 11.5 - 15.5 %   Platelets 233 150 - 400 K/uL   nRBC 0.0 0.0 - 0.2 %  Neutrophils Relative % 81 %   Neutro Abs 8.9 (H) 1.7 - 7.7 K/uL   Lymphocytes Relative 11 %   Lymphs Abs 1.2 0.7 - 4.0 K/uL   Monocytes Relative 6 %   Monocytes Absolute 0.6 0.1 - 1.0 K/uL   Eosinophils Relative 1 %   Eosinophils Absolute 0.1 0.0 - 0.5 K/uL   Basophils Relative 0 %   Basophils Absolute 0.0 0.0 - 0.1 K/uL   Immature Granulocytes 1 %   Abs Immature Granulocytes 0.14 (H) 0.00 - 0.07 K/uL    Comment: Performed at [facility] [address] [place], [place] [address]  APTT     Status: None   Collection Time: 03/05/20  4:20 AM  Result Value Ref Range   aPTT 27 24 - 36 seconds    Comment: Performed at [facility] [address], [place], [place] [address]  ABO/Rh     Status: None   Collection Time: 03/05/20  4:20 AM  Result Value Ref Range   ABO/RH(D)      B POS Performed at [facility] [address], [place], [place] [address]   Type and screen [place]     Status: None   Collection Time: 03/05/20  6:50 AM  Result Value Ref Range   ABO/RH(D) B POS    Antibody Screen NEG    Sample Expiration      03/08/2020,2359 Performed at [facility], [place] [address] [place],  [address]   CBG monitoring, ED     Status: None   Collection Time: 03/05/20  7:23 AM  Result Value Ref Range   Glucose-Capillary 97 70 - 99 mg/dL     Comment: Glucose reference range applies only to samples taken after fasting for at least 8 hours.    CT ABDOMEN PELVIS WO CONTRAST  Result Date: 03/05/2020 CLINICAL DATA:  65 year old male with abnormal labs. Recurrent hyponatremia, hyperkalemia, elevated creatinine. History of complicated AAA repair last year. EXAM: CT ABDOMEN AND PELVIS WITHOUT CONTRAST TECHNIQUE: Multidetector CT imaging of the abdomen and pelvis was performed following the standard protocol without IV contrast. COMPARISON:  Renal ultrasound 03/04/2020. FINDINGS: Lower chest: No cardiomegaly or pericardial effusion. Lung bases are negative aside from mild linear atelectasis or scarring. Hepatobiliary: Streak artifact from the patient's upper extremities limits detail of the noncontrast liver. The gallbladder is contracted with internal stones or hyperdense sludge (series 3, image 23). No pericholecystic inflammation. No perihepatic free fluid. Pancreas: Negative. Spleen: Spleen size is at the upper limits of normal. No perisplenic fluid or discrete splenic lesion. Adrenals/Urinary Tract: Adrenal glands are within normal limits. Kidneys appear nonobstructed with no hydronephrosis, although there is mildly asymmetric perinephric inflammatory stranding, greater on the left and mild to moderate. Superimposed right upper pole simple fluid density renal cyst. Smaller left upper and lower pole similar cysts. Punctate right nephrolithiasis (coronal image 74). Right renal vascular calcifications also. No perinephric fluid collection. Proximal ureters are decompressed. Distal ureters are difficult to delineate. Foley catheter within the urinary bladder which is diminutive and mildly thick-walled (5 mm) with questionable perivesical inflammatory stranding. Stomach/Bowel: Decompressed rectum is indistinct. Sigmoid colon and descending colon are decompressed. Transverse colon is decompressed, and there is a right abdominal ostomy which appears to be  a right colon colostomy. No dilated small bowel. Percutaneous gastrostomy tube in place. Mild gas and fluid in the stomach and duodenum. No free air. No free fluid. Aside from presacral stranding,  no mesenteric stranding is identified in the abdomen. Vascular/Lymphatic: Highly abnormal appearance of the distal abdominal aorta, aortoiliac bifurcation and proximal iliac arteries. 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 No periaortic hematoma or inflammatory stranding. Vascular patency is not evaluated in the absence of IV contrast. No lymphadenopathy. Reproductive: Urethral catheter in place, otherwise negative. Other: Nonspecific moderate presacral stranding in the pelvis (series 3, image 65). Musculoskeletal: No acute osseous abnormality identified. IMPRESSION: 1. Nonspecific perinephric inflammatory stranding but no obstructive uropathy. Pyelonephritis cannot be evaluated on CT in the absence of IV contrast. There is no perinephric fluid collection. Punctate right nephrolithiasis noted. 2. Foley catheter in the urinary bladder with mild circumferential bladder wall thickening and questionable perivesical stranding. Consider cystitis/UTI. 3. Superimposed presacral inflammatory stranding in the pelvis, although nonspecific. Adjacent rectum is decompressed, without strong evidence of distal colitis. 4. Right abdominal ostomy with no evidence of bowel obstruction. Gastrostomy tube in place. 5. Abnormal aortoiliac bifurcation where a densely rim calcified, bifurcated aneurysm sac measures up to 50 mm diameter. Evidence of underlying prior surgical aortoiliac repair/bypass. No periaortic hematoma or inflammation. Vascular patency is not evaluated in the absence of IV contrast. 6. Gallbladder  contracted around stones or high density bile with no evidence of inflammation. Electronically Signed   By: Genevie Ann M.D.   On: 03/05/2020 04:39   DG Chest 1 View  Result Date: 03/04/2020 CLINICAL DATA:  Abnormal labs and hypotension EXAM: CHEST  1 VIEW COMPARISON:  None. FINDINGS: The heart size and mediastinal contours are within normal limits. Aortic atherosclerosis. Both lungs are clear. The visualized skeletal structures are unremarkable. IMPRESSION: No active disease. Electronically Signed   By: Donavan Foil M.D.   On: 03/04/2020 23:53   US RENAL  Result Date: 03/04/2020 CLINICAL DATA:  Acute renal disease. EXAM: RENAL / URINARY TRACT ULTRASOUND COMPLETE COMPARISON:  None. FINDINGS: Right Kidney: Renal measurements: 10.9 cm x 4.4 cm x 4.3 cm = volume: 106.26 mL. Echogenicity within normal limits. A 3.3 cm x 2.8 cm x 2.4 cm anechoic structure is seen within the upper pole of the right kidney. No abnormal flow is seen within this region on color Doppler evaluation. No hydronephrosis is visualized. Left Kidney: Renal measurements: 11.0 cm x 5.4 cm x 4.2 cm = volume: 130.80 mL. Echogenicity within normal limits. 2.8 cm x 2.0 cm x 2.1 cm and 1.8 cm x 1.5 cm x 1.7 cm anechoic structures are seen within the lower pole of the left kidney. An additional 2.1 cm x 1.9 cm x 2.5 cm anechoic structure is seen within the upper pole of the left kidney. No abnormal flow is seen within these regions on color Doppler evaluation. No hydronephrosis is visualized. Bladder: Limited in evaluation secondary to the presence of overlying bandages. Other: Of incidental note is the presence of a 4.3 cm hypoechoic area seen in between the spleen and left kidney. This area could not be duplicated clearly in transverse plane, as per the ultrasound technologist. IMPRESSION: Multiple bilateral simple renal cysts. Electronically Signed   By: Virgina Norfolk M.D.   On: 03/04/2020 22:39   MR FOOT RIGHT WO CONTRAST  Result Date:  03/05/2020 CLINICAL DATA:  Diabetic patient with a chronic skin ulcerations on the right foot. EXAM: MRI OF THE RIGHT FOREFOOT WITHOUT CONTRAST TECHNIQUE: Multiplanar,  multisequence MR imaging of the right forefoot was performed. No intravenous contrast was administered. COMPARISON:  Plain films right foot 03/05/2020 and 01/31/2020. FINDINGS: Bones/Joint/Cartilage Abnormal marrow signal is seen throughout the second through fifth toes. There is patchy marrow edema in the proximal phalanx of the great toe and base of the distal phalanx of the great toe. Also seen is patchy marrow edema in the head and neck of the first metatarsal. Mild marrow edema is seen throughout the remainder of the first metatarsal. There is a remote infarct in the head of the second metatarsal. Patchy, mild marrow edema is seen in the heads of the third, fourth and fifth metatarsals. Abnormal marrow signal in the proximal diaphysis of the fifth metatarsal is also identified and could be due to infarct or osteomyelitis; osteomyelitis is favored. There is also abnormal signal in the calcaneus consistent with a medullary infarct. Very mild, patchy air edema in the bones of the midfoot is identified. No joint effusion. Ligaments Intact. Muscles and Tendons Intermediate increased T2 signal in all imaged musculature is most consistent with diabetic myopathy. Soft tissues There is some subcutaneous edema about the foot. No abscess. Extensive bandaging is noted. IMPRESSION: Findings most consistent with osteomyelitis throughout the second through fifth toes. Patchy marrow edema in the proximal and distal phalanges of the great toe and head of the first, third, fourth and fifth is also likely due to osteomyelitis. 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 Abnormal signal in the proximal diaphysis of the fifth metatarsal has an appearance more in keeping with medullary infarct than osteomyelitis. Mild, patchy marrow  edema about the bones of the midfoot is not typical of osteomyelitis and may be due to stress or early neuropathic change. Negative for abscess or septic joint. Electronically Signed   By: Inge Rise M.D.   On: 03/05/2020 08:40   DG Foot Complete Right  Addendum Date: 03/05/2020   ADDENDUM REPORT: 03/05/2020 00:48 ADDENDUM: Critical Value/emergent results were called by telephone at the time of interpretation on 03/05/2020 at 12:48 am to provider Pollina MD, who verbally acknowledged these results. Electronically Signed   By: Lovena Le M.D.   On: 03/05/2020 00:48   Result Date: 03/05/2020 CLINICAL DATA:  Osteomyelitis EXAM: RIGHT FOOT COMPLETE - 3+ VIEW COMPARISON:  Radiograph 01/31/2020 FINDINGS: Increasingly conspicuous lucency involving the head of the fifth metacarpal and base of the fifth proximal phalanx with adjacent focal soft tissue swelling and linear lucency which may reflect a site of laceration or ulcerative change. Poorly delineated cortical margins seen along the base of the third and possibly fourth proximal phalangeal bases as well. More diffuse mild soft tissue swelling of the forefoot. More irregular ulcerative change in potential soft tissue gas is seen involving the tips of the first through fourth digits some of which may be associated with the nail beds. Bones are diffusely demineralized which significantly limits detection of subtle osseous abnormalities and injuries. Stable hallux valgus deformity with advanced arthrosis at the first MTP. Additional degenerative changes seen throughout the foot elsewhere. Small ankle joint effusion is noted. Plantar calcaneal spurring. IMPRESSION: 1. Increasingly conspicuous lucency involving the head of the fifth metacarpal and base of the fifth proximal phalanx concerning for osteomyelitis with adjacent focal soft tissue swelling and linear lucency which may reflect a site of laceration or ulcerative change. 2. Poorly delineated cortical  margins along the base of the third and possibly fourth proximal phalangeal bases. Could reflect additional site of  osteomyelitis. 3. Extensive soft tissue swelling of the forefoot with some more conspicuous irregularity of the soft tissues involving the tips of the first through fourth digits. Recommend close clinical assessment to assess for aggressive soft tissue infection. 4. 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 5. Ankle effusion without gross traumatic malalignment. Currently attempting to contact the ordering provider with a critical value result. Addendum will be submitted upon case discussion. Electronically Signed: By: Lovena Le M.D. On: 03/05/2020 00:44    Review of Systems  Constitutional: Negative for chills, diaphoresis and fever.  HENT: Negative for ear discharge, ear pain, hearing loss and tinnitus.   Eyes: Negative for photophobia and pain.  Respiratory: Negative for cough and shortness of breath.   Cardiovascular: Negative for chest pain.  Gastrointestinal: Negative for abdominal pain, nausea and vomiting.  Genitourinary: Negative for dysuria, flank pain, frequency and urgency.  Musculoskeletal: Negative for arthralgias, back pain, myalgias and neck pain.  Neurological: Negative for dizziness and headaches.  Hematological: Does not bruise/bleed easily.  Psychiatric/Behavioral: The patient is not nervous/anxious.    Blood pressure 121/64, pulse 68, temperature 98 F (36.7 C), temperature source Oral, resp. rate 11, height '5\' 11"'$  (1.803 m), weight 55.8 kg, SpO2 100 %. Physical Exam Constitutional:      General: He is not in acute distress.    Appearance: He is well-developed and well-nourished. He is not diaphoretic.  HENT:     Head: Normocephalic and atraumatic.  Eyes:     General: No scleral icterus.       Right eye: No discharge.        Left eye: No discharge.     Conjunctiva/sclera: Conjunctivae  normal.  Cardiovascular:     Rate and Rhythm: Normal rate and regular rhythm.  Pulmonary:     Effort: Pulmonary effort is normal. No respiratory distress.  Musculoskeletal:     Cervical back: Normal range of motion.     Comments: LLE No traumatic wounds, ecchymosis, or rash  Necrotic toes  No knee or ankle effusion  Knee stable to varus/ valgus and anterior/posterior stress  Sens DPN, SPN, TN intact  Motor EHL, ext, flex, evers grossly intact  DP 1+, PT 1+, No significant edema  Skin:    General: Skin is warm and dry.  Neurological:     Mental Status: He is alert.  Psychiatric:        Mood and Affect: Mood and affect normal.        Behavior: Behavior normal.     Assessment/Plan: Right foot ulceration -- Dr. Sharol Given to evaluate, will likely need BKA on this side as well.    Lisette Abu, PA-C Orthopedic Surgery [phone] 03/05/2020, 9:54 AM

## 2020-03-05 NOTE — ED Notes (Signed)
Pt to MRI

## 2020-03-05 NOTE — Progress Notes (Addendum)
HOSPITAL MEDICINE OVERNIGHT PROGRESS NOTE / H&P  ADDENDUM  Notified by ER provider that radiology has contacted them to inform them of lucency involving head of the fifth metacarpal and base of the fifth proximal phalanx concerning for osteomyelitis in addition to areas along the base of the third and possibly fourth proximal phalangeal bases concerning for additional sites of osteomyelitis.  Additionally, there is extensive soft tissue swelling in the forefoot with irregularity of the soft tissues and concern for development of gas seen along the tips of the first through fourth digits.  I reevaluated the foot at the bedside.  There is no evidence of crepitus or bulla formation since my last examination:     Due to concerns for osteomyelitis, will initiate intravenous antibiotic therapy.  Initiate antibiotics to cover for anaerobic organisms as well due to concerns for necrotizing soft tissue infection until MRI results come back and we can get further input from vascular surgery/infectious disease although this is much less likely.  I have attempted to page vascular surgery to discuss these findings.  In the meantime, will place order for noncontrast MRI of the foot (no contrast due to renal function).  We will continue to reevaluate foot wound to evaluate for any evidence of rapidly progressive necrotizing infection.  Additionally, I have now had the opportunity to evaluate the patient's abdominal wound.  There is a significant amount of greenish purulence coming from the anterior abdominal wound with foul smell.  Intravenous antibiotics that have been ordered should additionally cover these suspected organisms.  I have ordered a wound culture of the identified drainage.  Please see picture below:    Wound care team will evaluate this wound as well in the morning.  Patient may additionally benefit from infectious disease consultation in the morning.  Sherryll Burger Taraoluwa Thakur

## 2020-03-05 NOTE — ED Notes (Signed)
RN ( Male nurse) unaware to remember his name took report but at this time room is still marked as dirty. On coming RN is aware. ([place])

## 2020-03-05 NOTE — Progress Notes (Signed)
Pharmacy Antibiotic Note  Curtis Solis is a 65 y.o. male admitted on 03/04/2020 with wound infection.  Pharmacy has been consulted for Vancomycin/Merrem dosing. WBC 12.6. Acute on chronic kidney disease, not on HD.   Plan: Vancomycin 1250 mg IV x 1, further dosing based on renal function trend Merrem 1g IV q24h Clindamycin per MD Trend WBC, temp, renal function  F/U infectious work-up Drug levels as indicated   Height: '5\' 11"'$  (180.3 cm) Weight: 55.8 kg (123 lb) IBW/kg (Calculated) : 75.3  Temp (24hrs), Avg:98.9 F (37.2 C), Min:98.9 F (37.2 C), Max:98.9 F (37.2 C)  Recent Labs  Lab 03/04/20 1821 03/04/20 2039  WBC 12.6*  --   CREATININE 10.00*  --   LATICACIDVEN  --  1.1    Estimated Creatinine Clearance: 6 mL/min (A) (by C-G formula based on SCr of 10 mg/dL (H)).    No Known Allergies  Narda Bonds, PharmD, BCPS Clinical Pharmacist Phone: [phone]

## 2020-03-05 NOTE — Progress Notes (Signed)
[facility] Progress Note  Name: Curtis Solis MRN: [id number] DOB: 12-28-1955  Subjective:  He has been transferred to the floor.  Strict ins/outs are not available - just came from ER.  He states had foley at his facility.  He states they have emptied multiple times.  He has ostomy - he states hard to know what "normal" output for that is.   Review of systems:  Denies shortness of breath or chest pain Denies n/v --------- Background on consult:  This is a 65 y.o. Black or African American male who is resident of an skilled nursing facility.  He has a history of diabetes mellitus type 2, HTN, PAD, tobacco use.   He was recently discharged 02/20/2020-02/25/2020 [facility].  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  He was subsequently admitted   to [facility] with oliguric acute renal failure 1/21-1/27.  He was seen in vascular surgery clinic on 02/20/2020 from SNF for evaluation of RLE wounds and subsequently to the ED at [facility] with kyperkalemia to 7.5.  He did not require dialysis on this admission and his hyperkalemia was medically managed.  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  He was prescribed Florinef by his nephrologist Dr. Shelton Silvas.  He was sent to the emergency room from [place] facility with abnormal labs and hypotension   Intake/Output Summary (Last 24 hours) at 03/05/2020 1709 Last data filed at 03/05/2020 1524 Gross per 24 hour  Intake --  Output 1000 ml  Net -1000 ml    Vitals:   Vitals:   03/05/20 1415 03/05/20 1430 03/05/20 1445 03/05/20 1639  BP: 124/73 125/76 120/77 (!) 115/57  Pulse: 67 72 75 71  Resp: '11 12 11 10  '$ Temp:   (!) 97.5 F (36.4 C) 97.6 F (36.4 C)  TempSrc:   Oral Oral  SpO2: 100% 100% 100%   Weight:    55.3 kg  Height:    '5\' 11"'$  (1.803 m)     Physical Exam:  General adult male in bed in no acute distress HEENT normocephalic atraumatic extraocular movements intact sclera anicteric Neck supple trachea midline Lungs clear to auscultation bilaterally normal work of breathing at rest  Heart S1S2 no rub Abdomen soft nontender nondistended ostomy in place Extremities no edema  Psych normal mood and affect Neuro - alert and oriented x 3 provides hx and follows commands GU - foley in place   Medications reviewed   Labs:  BMP Latest Ref Rng & Units 03/05/2020 03/04/2020 02/05/2020  Glucose 70 - 99 mg/dL 138(H) 106(H) 134(H)  BUN 8 - 23 mg/dL 84(H) 84(H) 45(H)  Creatinine 0.61 - 1.24 mg/dL 9.29(H) 10.00(H) 3.60(H)  Sodium 135 - 145 mmol/L 130(L) 126(L) 136  Potassium 3.5 - 5.1 mmol/L 5.6(H) 5.2(H) 3.8  Chloride 98 - 111 mmol/L 95(L) 89(L) 105  CO2 22 - 32 mmol/L 20(L) 22 24  Calcium 8.9 - 10.3 mg/dL 9.4 9.7 9.8     Assessment/Plan:    Acute on chronic kidney disease.    Felt to be pre-renal on consult.  Patient may also be addisonian.  Bladder scan does not reveal any evidence of  obstruction.  It appears that his baseline creatinine on discharge from [facility] was about 3 mg/dL.  He has had a very complicated course with an episode of acute kidney injury requiring dialysis following his abdominal aortic aneurysm repair.  CT a/p with no obstructive uropathy  Continue foley  Renal panel in AM    Continue NS - change to 100 ml/hr for another 24 hours  In view of his recent clinical course it may be wise to involve palliative medicine and establish goals of care.  Hyponatremia improving. Felt depleted but also may be adrenally  insufficient  Hypotension cortisol ok.  blood cultures ngtd. covid neg  Hyperkalemia lokelma this AM  Diabetes mellitus as per primary service  Hypoaldosteronism - cortisol ok from 04:26 am today at 23.6.  Note on solucortef here  Metabolic acidosis - continue home dose of sodium bicarb   Claudia Desanctis, MD 03/05/2020 5:33 PM

## 2020-03-05 NOTE — Consult Note (Addendum)
[place] Nurse Consult Note: Patient receiving care in [place] Patient is from [facility] Reason for Consult: Multiple wounds Wound type: Pink scar tissue on the buttocks Abdominal surgical wound with skin grafts, brown with scattered pink friable areas and brown/yellow tissue. Right lateral upper thigh donor site, pink/red moist with scattered drainage on the xeroform dressing Right foot medial head is pink with some crusting, all toes are black, the heel is pink Pressure Injury POA: NA Dressing procedure/placement/frequency: Clean the buttocks with soap and water, rinse and pat dry. Apply Desitin twice daily or PRN soiling. Gently cleanse the abdominal wound with NS pat dry and cover with Xeroform gauze, cover with ABD pad and secure with Medipor tape Gently cleanse the right upper lateral thigh with NS and pat dry. Apply Xeroform gauze, cover with ABD pad and secure with Medipor tape. Gently cleanse the right foot with soap and water, rinse and pat dry or allow to air dry. Paint the toes and the heel with betadine/iodine, cover the heel with a 4x4 then wrap the entire foot with a Kerlix. Change all dressings daily.   Order and place patient on standard air mattress Place right foot in Prevalon heel lift boot  WOC Nurse ostomy consult note Pouch changed today Stoma type/location: Right ileostomy Stomal assessment/size: 1 1/8" just above the level of skin Peristomal assessment: Intact Output: mushy brown yellow  Ostomy pouching: 1pc. With barrier ring  Education provided: None Enrolled patient in [place] program: No  1 piece fecal pouching system                        [place] # [place] # [phone]  Monitor the wound area(s) for worsening of condition such as: Signs/symptoms of infection, increase in size, development of or worsening of odor, development of pain, or increased pain at the affected locations.   Notify the medical team if any  of these develop.  Thank you for the consult. [place] nurse will not follow at this time.   Please re-consult the [place] team if needed.  Cathlean Marseilles Tamala Julian, MSN, RN, [place], Lysle Pearl, [facility] Wound Treatment Associate Pager [phone]

## 2020-03-05 NOTE — ED Notes (Addendum)
Pt is resting.

## 2020-03-05 NOTE — Progress Notes (Addendum)
Same day note  Patient seen and examined at bedside.  Patient was admitted to the hospital for abnormal labs including hyponatremia hyperkalemia and elevated creatinine.  At the time of my evaluation, patient complains of no increasing pain fever or chills.  Physical examination reveals thinly built male with PEG tube ileostomy and abdominal wound covered in dressing. Has chronic Foley catheter. Right foot with dressing, necrotic foot.  Left above-knee amputation.  Vitals with BMI 03/05/2020 03/05/2020 03/05/2020  Height - - -  Weight - - -  BMI - - -  Systolic 123XX123 [id number]  Diastolic 64 68 71  Pulse 68 58 70    Laboratory data and imaging was reviewed  Assessment and Plan.  Acute kidney injury likely acute tubular necrosis on CKD stage IV.  No evidence of obstruction on the bladder scan.  Urine analysis with abnormal but likely secondary to volume depletion.  Continue hydration.  Avoid nephrotoxic medication.  Nephrology has been consulted. Strict intake and output charting, daily weights. On normal saline at this time.  Lab Results  Component Value Date   CREATININE 9.29 (H) 03/05/2020   CREATININE 10.00 (H) 03/04/2020   CREATININE 3.60 (H) 02/05/2020    Hypotension.  On presentation.  Lactate was normal.  Prior blood pressure readings were on the lower side at baseline.  There was some concern for adrenal insufficiency.  Check cortisol level.  Put the patient on IV hydrocortisone for now.  Hold off with antibiotic at this time. 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  Patient was recently evaluated by vascular surgery at [facility] on [address] and had unremarkable ABI.  Vascular surgery recommended conservative treatment.  Patient with multiple ischemic gangrene toes.  Wound care has been consulted.  Vascular surgery consultation.  Check x-ray of right foot with lucency involving the fifth metacarpal and proximal phalanx concerning for  osteomyelitis.  Extensive soft tissue swelling in the forefoot.  Patient has been started on empiric antibiotic at this time with clindamycin, vancomycin and Merrem.  Will check MRI of the foot to rule out soft tissue necrosis. Will need ID consultation as well.  Chronic urinary retention/open wound of penis.  Currently on a Foley catheter.  Might need Foley catheter exchange.  Mild recurrent hyponatremia.  Possibly hypovolemic.  Nephrology on board.  Hyperkalemia on presentation.  Secondary to acute kidney injury.  Received Lokelma.  Continue IV hydration.  Continue to monitor potassium levels.  Chronic abdominal wound greenish discharge with ileostomy.  Wound care to evaluate.  Already on empiric antibiotic.  Severe protein calorie malnutrition.  Patient with longstanding poor oral intake BMI of 17.6 poor muscle tone and temporal wasting.  Continue nutritional supplements.  Dietary consulted.  GERD.  Continue PPI.  Diabetes mellitus II with CKD.  Continue Accu-Cheks diabetic diet sliding scale insulin.  Communicated with orthopedics on call for evaluation of the foot.  Signed,  Delila Pereyra, MD [facility]

## 2020-03-05 NOTE — ED Notes (Signed)
Tried calling report and at this time floor is refusing to take report because RN is on lunch. At this time. Room is still being clean and not marked ready yet.

## 2020-03-06 DIAGNOSIS — I739 Peripheral vascular disease, unspecified: Secondary | ICD-10-CM

## 2020-03-06 DIAGNOSIS — I96 Gangrene, not elsewhere classified: Secondary | ICD-10-CM | POA: Diagnosis not present

## 2020-03-06 LAB — CBC
HCT: 22.2 % — ABNORMAL LOW (ref 39.0–52.0)
Hemoglobin: 6.8 g/dL — CL (ref 13.0–17.0)
MCH: 27.9 pg (ref 26.0–34.0)
MCHC: 30.6 g/dL (ref 30.0–36.0)
MCV: 91 fL (ref 80.0–100.0)
Platelets: 261 10*3/uL (ref 150–400)
RBC: 2.44 MIL/uL — ABNORMAL LOW (ref 4.22–5.81)
RDW: 17.2 % — ABNORMAL HIGH (ref 11.5–15.5)
WBC: 11.1 10*3/uL — ABNORMAL HIGH (ref 4.0–10.5)
nRBC: 0 % (ref 0.0–0.2)

## 2020-03-06 LAB — PREPARE RBC (CROSSMATCH)

## 2020-03-06 LAB — RENAL FUNCTION PANEL
Albumin: 2.1 g/dL — ABNORMAL LOW (ref 3.5–5.0)
Anion gap: 15 (ref 5–15)
BUN: 80 mg/dL — ABNORMAL HIGH (ref 8–23)
CO2: 20 mmol/L — ABNORMAL LOW (ref 22–32)
Calcium: 9.2 mg/dL (ref 8.9–10.3)
Chloride: 101 mmol/L (ref 98–111)
Creatinine, Ser: 7.44 mg/dL — ABNORMAL HIGH (ref 0.61–1.24)
GFR, Estimated: 8 mL/min — ABNORMAL LOW (ref >60)
Glucose, Bld: 145 mg/dL — ABNORMAL HIGH (ref 70–99)
Phosphorus: 6.6 mg/dL — ABNORMAL HIGH (ref 2.5–4.6)
Potassium: 5.3 mmol/L — ABNORMAL HIGH (ref 3.5–5.1)
Sodium: 136 mmol/L (ref 135–145)

## 2020-03-06 LAB — URINE CULTURE: Culture: NO GROWTH

## 2020-03-06 LAB — GLUCOSE, CAPILLARY
Glucose-Capillary: 127 mg/dL — ABNORMAL HIGH (ref 70–99)
Glucose-Capillary: 106 mg/dL — ABNORMAL HIGH (ref 70–99)
Glucose-Capillary: 165 mg/dL — ABNORMAL HIGH (ref 70–99)
Glucose-Capillary: 175 mg/dL — ABNORMAL HIGH (ref 70–99)

## 2020-03-06 LAB — CK: Total CK: 16 U/L — ABNORMAL LOW (ref 49–397)

## 2020-03-06 LAB — MAGNESIUM: Magnesium: 1.9 mg/dL (ref 1.7–2.4)

## 2020-03-06 MED ORDER — DARBEPOETIN ALFA 40 MCG/0.4ML IJ SOSY
40.0000 ug | PREFILLED_SYRINGE | INTRAMUSCULAR | Status: DC
  Administered 2020-03-07 – 2020-03-13 (×2): 40 ug via SUBCUTANEOUS
  Filled 2020-03-06 (×2): qty 0.4

## 2020-03-06 MED ORDER — NEPRO/CARBSTEADY PO LIQD
237.0000 mL | Freq: Two times a day (BID) | ORAL | Status: DC
  Administered 2020-03-07 – 2020-03-13 (×10): 237 mL via ORAL

## 2020-03-06 MED ORDER — SODIUM CHLORIDE 0.9% IV SOLUTION: Freq: Once | INTRAVENOUS | Status: AC

## 2020-03-06 MED ORDER — ACETAMINOPHEN 325 MG PO TABS
650.0000 mg | ORAL_TABLET | Freq: Once | ORAL | Status: AC
  Administered 2020-03-06: 650 mg via ORAL
  Filled 2020-03-06: qty 2

## 2020-03-06 MED ORDER — RENA-VITE PO TABS
1.0000 | ORAL_TABLET | Freq: Every day | ORAL | Status: DC
  Administered 2020-03-07 – 2020-03-13 (×8): 1 via ORAL
  Filled 2020-03-06 (×8): qty 1

## 2020-03-06 MED ORDER — OXYCODONE HCL 5 MG PO TABS: 5.0000 mg | ORAL_TABLET | Freq: Four times a day (QID) | ORAL | Status: DC | PRN

## 2020-03-06 MED ORDER — PROSOURCE TF PO LIQD
45.0000 mL | Freq: Two times a day (BID) | ORAL | Status: DC
  Administered 2020-03-06 – 2020-03-13 (×14): 45 mL
  Filled 2020-03-06 (×16): qty 45

## 2020-03-06 MED ORDER — DIPHENHYDRAMINE HCL 25 MG PO CAPS
25.0000 mg | ORAL_CAPSULE | Freq: Once | ORAL | Status: AC
  Administered 2020-03-06: 25 mg via ORAL
  Filled 2020-03-06: qty 1

## 2020-03-06 MED ORDER — OSMOLITE 1.5 CAL PO LIQD
1000.0000 mL | ORAL | Status: DC
  Administered 2020-03-06 – 2020-03-12 (×6): 1000 mL
  Filled 2020-03-06 (×8): qty 1000

## 2020-03-06 MED ORDER — TRAMADOL HCL 50 MG PO TABS
50.0000 mg | ORAL_TABLET | Freq: Four times a day (QID) | ORAL | Status: AC | PRN
  Administered 2020-03-07 – 2020-03-08 (×3): 50 mg via ORAL
  Filled 2020-03-06 (×3): qty 1

## 2020-03-06 NOTE — Plan of Care (Signed)
  Problem: Education: Goal: Knowledge of disease and its progression will improve Outcome: Progressing   Problem: Fluid Volume: Goal: Compliance with measures to maintain balanced fluid volume will improve Outcome: Progressing   Problem: Health Behavior/Discharge Planning: Goal: Ability to manage health-related needs will improve Outcome: Progressing   Problem: Nutritional: Goal: Ability to make healthy dietary choices will improve Outcome: Progressing   Problem: Clinical Measurements: Goal: Complications related to the disease process, condition or treatment will be avoided or minimized Outcome: Progressing   Problem: Education: Goal: Knowledge of General Education information will improve Description: Including pain rating scale, medication(s)/side effects and non-pharmacologic comfort measures Outcome: Progressing   Problem: Health Behavior/Discharge Planning: Goal: Ability to manage health-related needs will improve Outcome: Progressing   Problem: Clinical Measurements: Goal: Ability to maintain clinical measurements within normal limits will improve Outcome: Progressing Goal: Will remain free from infection Outcome: Progressing Goal: Diagnostic test results will improve Outcome: Progressing Goal: Respiratory complications will improve Outcome: Progressing Goal: Cardiovascular complication will be avoided Outcome: Progressing   Problem: Activity: Goal: Risk for activity intolerance will decrease Outcome: Progressing   Problem: Nutrition: Goal: Adequate nutrition will be maintained Outcome: Progressing   Problem: Coping: Goal: Level of anxiety will decrease Outcome: Progressing   Problem: Elimination: Goal: Will not experience complications related to bowel motility Outcome: Progressing Goal: Will not experience complications related to urinary retention Outcome: Progressing   Problem: Pain Managment: Goal: General experience of comfort will  improve Outcome: Progressing   Problem: Safety: Goal: Ability to remain free from injury will improve Outcome: Progressing   Problem: Skin Integrity: Goal: Risk for impaired skin integrity will decrease Outcome: Progressing

## 2020-03-06 NOTE — Progress Notes (Signed)
Date and time results received: 03/06/20  0326 (use smartphrase ".now" to insert current time)  Test:  Lab Hgb Critical Value:  6.8  Name of Provider Notified: [place], MD via text page at [phone]. MD called this RN at [place]? Or Actions Taken?: Orders Received - See Orders for details

## 2020-03-06 NOTE — Progress Notes (Addendum)
9:50am-CSW following for discharge planning needs. Patient came from [facility] and will likely require insurance authorization to return.   12pm-CSW spoke with Donneta Romberg at [facility] and she reported that patient does not have [employer] days left and that he will be returning to the facility Medicaid pending, so does not need auth.   Trenisha Lafavor LCSW

## 2020-03-06 NOTE — Progress Notes (Signed)
Pt with decrease in Hgb to 6.8. no signs of bleeding.  PRBC ordered.  Went to pt bedside and discussed need for PRBC as well as risk and benefits of the therapy.  He consents to having transfusion of PRBC. RN witnessed discussion

## 2020-03-06 NOTE — NC FL2 (Signed)
[place] LEVEL OF CARE SCREENING TOOL     IDENTIFICATION  Patient Name: Curtis Solis Birthdate: 1955-11-19 Sex: male Admission Date (Current Location): 03/04/2020  [facility] and [place] Number:  Herbalist and Address:  The . [facility], [place] [address], [place], [place] [address]      Provider Number: [id number]  Attending Physician Name and Address:  Flora Lipps, MD  Relative Name and Phone Number:  [name] (spouse) [phone]    Current Level of Care: Hospital Recommended Level of Care: [place] Prior Approval Number:    Date Approved/Denied:   PASRR Number: [id number]  Discharge Plan: SNF    Current Diagnoses: Patient Active Problem List   Diagnosis Date Noted  . PVD (peripheral vascular disease) ([place])   . Controlled type 2 diabetes mellitus with stage 4 chronic kidney disease, without long-term current use of insulin ([place]) 03/05/2020  . Hypotension 03/05/2020  . Urinary retention 03/04/2020  . 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 GERD without esophagitis 03/04/2020  . Hyponatremia 03/04/2020  . Severe protein-calorie malnutrition ([place]) 02/04/2020  . Acute renal failure with acute tubular necrosis superimposed on stage 4 chronic kidney disease ([place]) 01/31/2020  . Hyperkalemia 01/31/2020  . Uremia 01/31/2020  . Gangrene of right foot ([place]) 01/31/2020  . Ileostomy present ([place]) 01/31/2020  . Open abdominal wall wound, subsequent encounter 01/31/2020  . Open wound of penis 01/31/2020  . Acute lower UTI 01/31/2020  . Pressure injury of skin 01/31/2020  . S/P AKA (above knee amputation) unilateral, left ([place]) 10/23/2019  . 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 AAA (abdominal aortic aneurysm) ([place]) 10/06/2019  . CKD (chronic kidney disease) stage 4, GFR 15-29 ml/min (HCC) 10/06/2019    Orientation RESPIRATION BLADDER Height &  Weight     Self,Time,Situation,Place  Normal Continent Weight: 121 lb 14.6 oz (55.3 kg) Height:  '5\' 11"'$  (180.3 cm)  BEHAVIORAL SYMPTOMS/MOOD NEUROLOGICAL BOWEL NUTRITION STATUS      Colostomy Diet (See DC summary)  AMBULATORY STATUS COMMUNICATION OF NEEDS Skin   Extensive Assist Verbally Other (Comment) (Stage 2 on buttocks, non-pressure wound left thigh, wound on right anterior toe, wound on right anterior thigh, wound on right heel)                       Personal Care Assistance Level of Assistance  Bathing,Feeding,Dressing Bathing Assistance: Maximum assistance Feeding assistance: Independent Dressing Assistance: Maximum assistance     Functional Limitations Info  Sight,Hearing,Speech Sight Info: Adequate Hearing Info: Adequate Speech Info: Adequate    SPECIAL CARE FACTORS FREQUENCY  PT (By licensed PT),OT (By licensed OT)                    Contractures      Additional Factors Info  Code Status,Allergies,Insulin Sliding Scale Code Status Info: FULL Allergies Info: NKA   Insulin Sliding Scale Info: See DC summary       Current Medications (03/06/2020):  This is the current hospital active medication list Current Facility-Administered Medications  Medication Dose Route Frequency Provider Last Rate Last Admin  . 0.9 %  sodium chloride infusion (Manually program via Guardrails IV Fluids)   Intravenous Once Chotiner, Yevonne Aline, MD      . 0.9 %  sodium chloride infusion   Intravenous Continuous Claudia Desanctis, MD 125 mL/hr at 03/06/20 1057 Rate Change at 03/06/20 1057  .  acetaminophen (TYLENOL) tablet 650 mg  650 mg Oral Q6H PRN Shalhoub, Sherryll Burger, MD       Or  . acetaminophen (TYLENOL) suppository 650 mg  650 mg Rectal Q6H PRN Shalhoub, Sherryll Burger, MD      . atorvastatin (LIPITOR) tablet 40 mg  40 mg Oral Daily Shalhoub, Sherryll Burger, MD   40 mg at 03/06/20 0906  . calcium carbonate (TUMS - dosed in mg elemental calcium) chewable tablet 200 mg of elemental calcium   1 tablet Oral Q8H PRN Shalhoub, Sherryll Burger, MD      . Chlorhexidine Gluconate Cloth 2 % PADS 6 each  6 each Topical Daily Shalhoub, Sherryll Burger, MD   6 each at 03/06/20 [phone]  . clindamycin (CLEOCIN) IVPB 600 mg  600 mg Intravenous Q8H Shalhoub, Sherryll Burger, MD 100 mL/hr at 03/06/20 0524 600 mg at 03/06/20 0524  . Darbepoetin Alfa (ARANESP) injection 40 mcg  40 mcg Subcutaneous Q Fri-1800 Harrie Jeans C, MD      . feeding supplement (NEPRO CARB STEADY) liquid 237 mL  237 mL Oral BID BM Shalhoub, Sherryll Burger, MD 0 mL/hr at 03/05/20 1447 237 mL at 03/06/20 0907  . fentaNYL (SUBLIMAZE) injection 25 mcg  25 mcg Intravenous Q2H PRN Vernelle Emerald, MD   25 mcg at 03/05/20 2246  . gabapentin (NEURONTIN) capsule 200 mg  200 mg Oral QHS Shalhoub, Sherryll Burger, MD   200 mg at 03/05/20 2228  . heparin injection 5,000 Units  5,000 Units Subcutaneous Q8H Shalhoub, Sherryll Burger, MD   5,000 Units at 03/05/20 2228  . hydrocortisone sodium succinate (SOLU-CORTEF) 100 MG injection 50 mg  50 mg Intravenous Q6H Shalhoub, Sherryll Burger, MD   50 mg at 03/06/20 [id number]  . hydrOXYzine (ATARAX/VISTARIL) tablet 10 mg  10 mg Oral Q6H PRN Vernelle Emerald, MD      . insulin aspart (novoLOG) injection 0-9 Units  0-9 Units Subcutaneous TID AC & HS Shalhoub, Sherryll Burger, MD   1 Units at 03/06/20 [phone]  . ipratropium-albuterol (DUONEB) 0.5-2.5 (3) MG/3ML nebulizer solution 3 mL  3 mL Nebulization Q4H PRN Shalhoub, Sherryll Burger, MD      . liver oil-zinc oxide (DESITIN) 40 % ointment   Topical BID Flora Lipps, MD   Given at 03/06/20 [phone]  . meropenem (MERREM) 1 g in sodium chloride 0.9 % 100 mL IVPB  1 g Intravenous Q24H Erenest Blank, RPH 200 mL/hr at 03/06/20 0419 1 g at 03/06/20 0419  . ondansetron (ZOFRAN) tablet 4 mg  4 mg Oral Q6H PRN Shalhoub, Sherryll Burger, MD       Or  . ondansetron [facility]) injection 4 mg  4 mg Intravenous Q6H PRN Shalhoub, Sherryll Burger, MD      . pantoprazole (PROTONIX) EC tablet 20 mg  20 mg Oral Daily Shalhoub, Sherryll Burger, MD   20 mg at  03/06/20 0906  . sodium bicarbonate tablet 1,300 mg  1,300 mg Oral BID Vernelle Emerald, MD   1,300 mg at 03/06/20 0906  . vancomycin variable dose per unstable renal function (pharmacist dosing)   Does not apply See admin instructions von Caren Griffins, [facility]         Discharge Medications: Please see discharge summary for a list of discharge medications.  Relevant Imaging Results:  Relevant Lab Results:   Additional Information SSN [place] Tamala Julian, [place] Work

## 2020-03-06 NOTE — Consult Note (Signed)
ORTHOPAEDIC CONSULTATION  REQUESTING PHYSICIAN: Pokhrel, Laxman, MD  Chief Complaint: Dry gangrene toes of the right foot  HPI: Kreg Pitsenbarger is a 65 y.o. male who presents with peripheral vascular disease status post abdominal aortic aneurysm repair and at the same time undergoing a left above-the-knee amputation. Patient has had the wound healing complications and is seen for evaluation for the dry gangrene right forefoot.  Past Medical History:  Diagnosis Date  . A-fib [facility])    During hospital stay  . CKD (chronic kidney disease)   . Hyperlipidemia   . Hyperthyroidism   . PVD (peripheral vascular disease) ([place])    Past Surgical History:  Procedure Laterality Date  . ABDOMINAL AORTIC ANEURYSM REPAIR    . ABOVE KNEE LEG AMPUTATION Left   . BOWEL RESECTION     for dead bowel, s/p open abdomen  . COLOSTOMY    . GASTROSTOMY    . left leg amputation    . PERICARDIAL FLUID DRAINAGE     Placed for tamponade and removed during the [facility] hospitalization   Social History   Socioeconomic History  . Marital status: Married    Spouse name: Not on file  . Number of children: Not on file  . Years of education: Not on file  . Highest education level: Not on file  Occupational History  . Not on file  Tobacco Use  . Smoking status: Former Smoker    Quit date: 09/26/2019    Years since quitting: 0.4  . Smokeless tobacco: Never Used  Substance and Sexual Activity  . Alcohol use: Yes  . Drug use: Not on file  . Sexual activity: Not on file  Other Topics Concern  . Not on file  Social History Narrative  . Not on file   Social Determinants of Health   Financial Resource Strain: Not on file  Food Insecurity: Not on file  Transportation Needs: Not on file  Physical Activity: Not on file  Stress: Not on file  Social Connections: Not on file   Family History  Problem Relation Age of Onset  . Heart disease Other    - negative except otherwise stated in the family history  section No Known Allergies Prior to Admission medications   Medication Sig Start Date End Date Taking? Authorizing Provider  acetaminophen (TYLENOL) 500 MG tablet Take 1,000 mg by mouth every 6 (six) hours as needed for mild pain (or headaches).   Yes [provider]  atorvastatin (LIPITOR) 40 MG tablet Take 40 mg by mouth daily at 6 PM.   Yes [provider]  calcium carbonate (TUMS - DOSED IN MG ELEMENTAL CALCIUM) 500 MG chewable tablet Chew 1 tablet by mouth every 8 (eight) hours as needed for indigestion or heartburn.   Yes [provider]  carboxymethylcellulose (REFRESH PLUS) 0.5 % SOLN Place 2 drops into both eyes every 12 (twelve) hours as needed (for dryness).   Yes [provider]  gabapentin (NEURONTIN) 100 MG capsule Take 200 mg by mouth at bedtime.   Yes [provider]  ipratropium-albuterol (DUONEB) 0.5-2.5 (3) MG/3ML SOLN Take 3 mLs by nebulization every 6 (six) hours as needed (wheezing/cough/shortness of breath).   Yes [provider]  metoprolol tartrate (LOPRESSOR) 25 MG tablet Take 12.5 mg by mouth 2 (two) times daily.   Yes [provider]  NON FORMULARY Take 240 mLs by mouth See admin instructions. Novasource- Drink 240 ml's by mouth two times a day   Yes [provider]  ondansetron (ZOFRAN) 4 MG tablet Take 4 mg by mouth every 4 (four) hours as needed for nausea or vomiting.   Yes [provider]  oxyCODONE (ROXICODONE) 5 MG/5ML solution Take 5 mLs (5 mg total) by mouth every 4 (four) hours as needed (pain). 02/05/20  Yes Charlynne Cousins, MD  pantoprazole (PROTONIX) 20 MG tablet Take 20 mg by mouth daily before breakfast.   Yes [provider]  simethicone (MYLICON) 80 MG chewable tablet Chew 80 mg by mouth every 8 (eight) hours as needed for flatulence.   Yes [provider]  sodium bicarbonate 650 MG tablet Take 2 tablets (1,300 mg total) by mouth 2 (two) times daily.  02/05/20  Yes Charlynne Cousins, MD  sodium zirconium cyclosilicate (LOKELMA) 5 g packet Take 15 g by mouth in the morning.   Yes [provider]  traMADol (ULTRAM) 50 MG tablet Take 50 mg by mouth every 12 (twelve) hours as needed (for pain).   Yes [provider]  Water For Irrigation, Sterile (FREE WATER) SOLN Place 300 mLs into feeding tube every 4 (four) hours. Patient taking differently: Place 150 mLs into feeding tube See admin instructions. Flush G-Tube with 150 ml's of water four times a day 02/05/20  Yes Charlynne Cousins, MD  fludrocortisone (FLORINEF) 0.1 MG tablet Take 0.1 mg by mouth daily.    [provider]  Nutritional Supplements (FEEDING SUPPLEMENT, OSMOLITE 1.5 CAL,) LIQD Place 780 mLs into feeding tube continuous. Patient not taking: Reported on 03/05/2020 02/05/20   Charlynne Cousins, MD  Nutritional Supplements (FEEDING SUPPLEMENT, PROSOURCE TF,) liquid Place 45 mLs into feeding tube 3 (three) times daily. Patient not taking: Reported on 03/05/2020 02/05/20   Charlynne Cousins, MD   CT ABDOMEN PELVIS WO CONTRAST  Result Date: 03/05/2020 CLINICAL DATA:  65 year old male with abnormal labs. Recurrent hyponatremia, hyperkalemia, elevated creatinine. History of complicated AAA repair last year. EXAM: CT ABDOMEN AND PELVIS WITHOUT CONTRAST TECHNIQUE: Multidetector CT imaging of the abdomen and pelvis was performed following the standard protocol without IV contrast. COMPARISON:  Renal ultrasound 03/04/2020. FINDINGS: Lower chest: No cardiomegaly or pericardial effusion. Lung bases are negative aside from mild linear atelectasis or scarring. Hepatobiliary: Streak artifact from the patient's upper extremities limits detail of the noncontrast liver. The gallbladder is contracted with internal stones or hyperdense sludge (series 3, image 23). No pericholecystic inflammation. No perihepatic free fluid. Pancreas: Negative. Spleen: Spleen size is at the upper  limits of normal. No perisplenic fluid or discrete splenic lesion. Adrenals/Urinary Tract: Adrenal glands are within normal limits. Kidneys appear nonobstructed with no hydronephrosis, although there is mildly asymmetric perinephric inflammatory stranding, greater on the left and mild to moderate. Superimposed right upper pole simple fluid density renal cyst. Smaller left upper and lower pole similar cysts. Punctate right nephrolithiasis (coronal image 74). Right renal vascular calcifications also. No perinephric fluid collection. Proximal ureters are decompressed. Distal ureters are difficult to delineate. Foley catheter within the urinary bladder which is diminutive and mildly thick-walled (5 mm) with questionable perivesical inflammatory stranding. Stomach/Bowel: Decompressed rectum is indistinct. Sigmoid colon and descending colon are decompressed. Transverse colon is decompressed, and there is a right abdominal ostomy which appears to be a right colon colostomy. No dilated small bowel. Percutaneous gastrostomy tube in place. Mild gas and fluid in the stomach and duodenum. No free air. No free fluid. Aside from presacral stranding, no mesenteric stranding is identified in the abdomen. Vascular/Lymphatic: Highly abnormal appearance of the  distal abdominal aorta, aortoiliac bifurcation and proximal iliac arteries. 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 No periaortic hematoma or inflammatory stranding. Vascular patency is not evaluated in the absence of IV contrast. No lymphadenopathy. Reproductive: Urethral catheter in place, otherwise negative. Other: Nonspecific moderate presacral stranding in the pelvis (series 3, image 65). Musculoskeletal: No acute osseous abnormality identified.  IMPRESSION: 1. Nonspecific perinephric inflammatory stranding but no obstructive uropathy. Pyelonephritis cannot be evaluated on CT in the absence of IV contrast. There is no perinephric fluid collection. Punctate right nephrolithiasis noted. 2. Foley catheter in the urinary bladder with mild circumferential bladder wall thickening and questionable perivesical stranding. Consider cystitis/UTI. 3. Superimposed presacral inflammatory stranding in the pelvis, although nonspecific. Adjacent rectum is decompressed, without strong evidence of distal colitis. 4. Right abdominal ostomy with no evidence of bowel obstruction. Gastrostomy tube in place. 5. Abnormal aortoiliac bifurcation where a densely rim calcified, bifurcated aneurysm sac measures up to 50 mm diameter. Evidence of underlying prior surgical aortoiliac repair/bypass. No periaortic hematoma or inflammation. Vascular patency is not evaluated in the absence of IV contrast. 6. Gallbladder contracted around stones or high density bile with no evidence of inflammation. Electronically Signed   By: Genevie Ann M.D.   On: 03/05/2020 04:39   DG Chest 1 View  Result Date: 03/04/2020 CLINICAL DATA:  Abnormal labs and hypotension EXAM: CHEST  1 VIEW COMPARISON:  None. FINDINGS: The heart size and mediastinal contours are within normal limits. Aortic atherosclerosis. Both lungs are clear. The visualized skeletal structures are unremarkable. IMPRESSION: No active disease. Electronically Signed   By: Donavan Foil M.D.   On: 03/04/2020 23:53   US RENAL  Result Date: 03/04/2020 CLINICAL DATA:  Acute renal disease. EXAM: RENAL / URINARY TRACT ULTRASOUND COMPLETE COMPARISON:  None. FINDINGS: Right Kidney: Renal measurements: 10.9 cm x 4.4 cm x 4.3 cm = volume: 106.26 mL. Echogenicity within normal limits. A 3.3 cm x 2.8 cm x 2.4 cm anechoic structure is seen within the upper pole of the right kidney. No abnormal flow is seen within this region on color Doppler evaluation.  No hydronephrosis is visualized. Left Kidney: Renal measurements: 11.0 cm x 5.4 cm x 4.2 cm = volume: 130.80 mL. Echogenicity within normal limits. 2.8 cm x 2.0 cm x 2.1 cm and 1.8 cm x 1.5 cm x 1.7 cm anechoic structures are seen within the lower pole of the left kidney. An additional 2.1 cm x 1.9 cm x 2.5 cm anechoic structure is seen within the upper pole of the left kidney. No abnormal flow is seen within these regions on color Doppler evaluation. No hydronephrosis is visualized. Bladder: Limited in evaluation secondary to the presence of overlying bandages. Other: Of incidental note is the presence of a 4.3 cm hypoechoic area seen in between the spleen and left kidney. This area could not be duplicated clearly in transverse plane, as per the ultrasound technologist. IMPRESSION: Multiple bilateral simple renal cysts. Electronically Signed   By: Virgina Norfolk M.D.   On: 03/04/2020 22:39   MR FOOT RIGHT WO CONTRAST  Result Date: 03/05/2020 CLINICAL DATA:  Diabetic patient with a chronic skin ulcerations on the right foot. EXAM: MRI OF THE RIGHT FOREFOOT WITHOUT CONTRAST TECHNIQUE: Multiplanar, multisequence MR imaging of the right forefoot was performed. No intravenous contrast was administered.  COMPARISON:  Plain films right foot 03/05/2020 and 01/31/2020. FINDINGS: Bones/Joint/Cartilage Abnormal marrow signal is seen throughout the second through fifth toes. There is patchy marrow edema in the proximal phalanx of the great toe and base of the distal phalanx of the great toe. Also seen is patchy marrow edema in the head and neck of the first metatarsal. Mild marrow edema is seen throughout the remainder of the first metatarsal. There is a remote infarct in the head of the second metatarsal. Patchy, mild marrow edema is seen in the heads of the third, fourth and fifth metatarsals. Abnormal marrow signal in the proximal diaphysis of the fifth metatarsal is also identified and could be due to infarct or  osteomyelitis; osteomyelitis is favored. There is also abnormal signal in the calcaneus consistent with a medullary infarct. Very mild, patchy air edema in the bones of the midfoot is identified. No joint effusion. Ligaments Intact. Muscles and Tendons Intermediate increased T2 signal in all imaged musculature is most consistent with diabetic myopathy. Soft tissues There is some subcutaneous edema about the foot. No abscess. Extensive bandaging is noted. IMPRESSION: Findings most consistent with osteomyelitis throughout the second through fifth toes. Patchy marrow edema in the proximal and distal phalanges of the great toe and head of the first, third, fourth and fifth is also likely due to osteomyelitis. 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 Abnormal signal in the proximal diaphysis of the fifth metatarsal has an appearance more in keeping with medullary infarct than osteomyelitis. Mild, patchy marrow edema about the bones of the midfoot is not typical of osteomyelitis and may be due to stress or early neuropathic change. Negative for abscess or septic joint. Electronically Signed   By: Inge Rise M.D.   On: 03/05/2020 08:40   DG Foot Complete Right  Addendum Date: 03/05/2020   ADDENDUM REPORT: 03/05/2020 00:48 ADDENDUM: Critical Value/emergent results were called by telephone at the time of interpretation on 03/05/2020 at 12:48 am to provider Pollina MD, who verbally acknowledged these results. Electronically Signed   By: Lovena Le M.D.   On: 03/05/2020 00:48   Result Date: 03/05/2020 CLINICAL DATA:  Osteomyelitis EXAM: RIGHT FOOT COMPLETE - 3+ VIEW COMPARISON:  Radiograph 01/31/2020 FINDINGS: Increasingly conspicuous lucency involving the head of the fifth metacarpal and base of the fifth proximal phalanx with adjacent focal soft tissue swelling and linear lucency which may reflect a site of laceration or ulcerative change. Poorly delineated cortical margins  seen along the base of the third and possibly fourth proximal phalangeal bases as well. More diffuse mild soft tissue swelling of the forefoot. More irregular ulcerative change in potential soft tissue gas is seen involving the tips of the first through fourth digits some of which may be associated with the nail beds. Bones are diffusely demineralized which significantly limits detection of subtle osseous abnormalities and injuries. Stable hallux valgus deformity with advanced arthrosis at the first MTP. Additional degenerative changes seen throughout the foot elsewhere. Small ankle joint effusion is noted. Plantar calcaneal spurring. IMPRESSION: 1. Increasingly conspicuous lucency involving the head of the fifth metacarpal and base of the fifth proximal phalanx concerning for osteomyelitis with adjacent focal soft tissue swelling and linear lucency which may reflect a site of laceration or ulcerative change. 2. Poorly delineated cortical margins along the base of the third and possibly fourth proximal phalangeal bases. Could reflect additional site of osteomyelitis. 3. Extensive soft tissue swelling of the forefoot with some more conspicuous irregularity  of the soft tissues involving the tips of the first through fourth digits. Recommend close clinical assessment to assess for aggressive soft tissue infection. 4. 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 5. Ankle effusion without gross traumatic malalignment. Currently attempting to contact the ordering provider with a critical value result. Addendum will be submitted upon case discussion. Electronically Signed: By: Lovena Le M.D. On: 03/05/2020 00:44   - pertinent xrays, CT, MRI studies were reviewed and independently interpreted  Positive ROS: All other systems have been reviewed and were otherwise negative with the exception of those mentioned in the HPI and as above.  Physical  Exam: General: Alert, no acute distress Psychiatric: Patient is competent for consent with normal mood and affect Lymphatic: No axillary or cervical lymphadenopathy Cardiovascular: No pedal edema Respiratory: No cyanosis, no use of accessory musculature GI: No organomegaly, abdomen is soft and non-tender    Images:  '@ENCIMAGES'$ @  Labs:  Lab Results  Component Value Date   HGBA1C 5.1 03/05/2020   REPTSTATUS PENDING 03/05/2020   GRAMSTAIN  03/05/2020    RARE WBC PRESENT, PREDOMINANTLY PMN MODERATE GRAM POSITIVE COCCI    CULT  03/05/2020    ABUNDANT STAPHYLOCOCCUS AUREUS SUSCEPTIBILITIES TO FOLLOW Performed at [facility], [place] [address], [place], [place] [address]     Lab Results  Component Value Date   ALBUMIN 2.1 (L) 03/06/2020   ALBUMIN 2.4 (L) 03/05/2020   ALBUMIN 2.4 (L) 02/05/2020    Neurologic: Patient does not have protective sensation bilateral lower extremities.   MUSCULOSKELETAL:   Skin: Semination patient has hard dry gangrenous changes to all the toes his skin on his foot is thin and atrophic no ascending cellulitis no abscess no tenderness to palpation in the calf or ankle or hindfoot.  Patient has a strong palpable dorsalis pedis pulse on the right.  MRI scan of the right foot shows osteomyelitis involving the gangrenous toes.  Patient has severe protein caloric malnutrition with an albumin of 2.1  Assessment: Assessment: Peripheral vascular disease status post abdominal aortic aneurysm repair and a left above-the-knee amputation with dry gangrenous changes to the toes of the right foot with good pulses to the ankle.  Plan: Plan: I discussed with patient recommendation to proceed with a transmetatarsal amputation. Discussed the risks and benefits including risk of the incision not healing need for additional surgery. Patient states he understands he would like to discuss this with his family prior to proceeding with surgery I discussed that I  can speak to his family at any time and reviewed these findings. I will follow-up when patient is ready to proceed with surgery.  Thank you for the consult and the opportunity to see Mr. [place], MD [place] [phone] 11:18 AM

## 2020-03-06 NOTE — Progress Notes (Addendum)
PROGRESS NOTE  Curtis Solis [id number] DOB: [birth year] DOA: 03/04/2020 PCP: [name], MD   LOS: 2 days   Brief narrative:  65 year old male with past medical history of diabetes mellitus type 2, CKD stage IV, severe protein calorie malnutrition (S/P PEG), hypertension, chronic urinary retention with indwelling Foley catheter presented to to the hospital from [facility] via EMS for abnormal labs including recurrent hyponatremia, hyperkalemia and elevated creatinine.  Patient does have a history of complicated hospitalization in the past after AAA repair.  Patient had acute kidney injury at that time and required tunneled hemodialysis catheter placement and CRRT.  He also had mesenteric ischemia and underwent small bowel resection post ileostomy at this time.  He was intubated and required pericardiocentesis and drain placement on October 2021.   Patient additionally developed necrosis of the foreskin of the penis with urinary retention requiring blunt dissection and placement of a Foley catheter.  Patient eventually recovered, was extubated and was able to be transitioned off of renal replacement therapy in November  with a new baseline creatinine of approximately 3.0 (CKD IV).  Patient was discharged to [facility] 11/25/2019.  With renal function improvement, patient was eventually discharged on 02/25/20 back to [facility] skilled nursing facility.    After discharge to skilled nursing facility, patient continued to have generalized weakness.  A routine blood work done in the skilled nursing facility revealed hyperkalemia, hyponatremia and acute kidney injury.  In the ED, patient was noted to have creatinine of 10.0 BUN of 84, mild hyperkalemia 5.2 with hyponatremia of 126.  Patient was also found to be hypotensive with initial systolic blood pressures in the 80s.  Lactate was within normal limits.  Bladder scan and bilateral renal ultrasound performed the emergency department revealed no  evidence of outlet obstruction.    Due to concern for Addison's disease patient was given hydrocortisone and was admitted hospital for further evaluation and treatment.   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  No evidence of obstruction on the bladder scan.  Continue Foley catheter.   On IV hydration.  Avoid nephrotoxic medication.  Nephrology on board.  Strict intake and output charting, daily weights. On normal saline at this time.  Hypotension.    On IV hydration, continue hydrocortisone.  Blood pressure has improved at this time.  Chronic ulcer of the right foot with necrosis.  Patient was recently evaluated by vascular surgery at [facility] on 2/15 and had unremarkable ABI.  Vascular surgery recommended conservative treatment.  Patient with multiple ischemic gangrene toes.  Wound care has been consulted.   Check x-ray of right foot with lucency involving the fifth metacarpal and proximal phalanx concerning for osteomyelitis.  Extensive soft tissue swelling in the forefoot.    On clindamycin, vancomycin and Merrem.  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  Orthopedic has seen the  patient and is awaiting for Dr Sharol Given to asses the patient.  Might need below-knee amputation.  Chronic urinary retention/open wound of penis.  Currently on a Foley catheter.   Mild  recurrent hyponatremia.    Improved at this time.  Latest sodium of 136  Hyperkalemia mild.  Underlying AKI.  Check BMP in AM.  Chronic abdominal wound greenish discharge with ileostomy.  Wound care on board.  Continue broad-spectrum antibiotics.  Severe protein calorie malnutrition.  Patient with longstanding poor oral intake, BMI of 17.6, poor muscle tone and temporal wasting.  Continue nutritional supplements.  Dietary consulted.  GERD.  Continue PPI.  Diabetes mellitus II with CKD.  Continue Accu-Cheks, diabetic diet sliding scale insulin.  DVT prophylaxis: heparin injection 5,000 Units Start: 03/05/20 0600   Code Status: Full code  Family Communication: None  Status is: Inpatient  Remains inpatient appropriate because:IV treatments appropriate due to intensity of illness or inability to take PO and Inpatient level of care appropriate due to severity of illness, IV antibiotics, need for orthopedic evaluation possible surgery   Dispo: The patient is from: Skilled nursing facility              Anticipated d/c is to: Skilled nursing facility              Patient currently is not medically stable to d/c.   Difficult to place patient No   Consultants:  Orthopedics  Nephrology  Procedures:  None  Anti-infectives:  Marland Kitchen Vancomycin Merrem and clindamycin 2/24>  Anti-infectives (From admission, onward)   Start     Dose/Rate Route Frequency Ordered Stop   03/06/20 0000  vancomycin variable dose per unstable renal function (pharmacist dosing)         Does not apply See admin instructions 03/05/20 0926     03/05/20 0300  meropenem (MERREM) 1 g in sodium chloride 0.9 % 100 mL IVPB        1 g 200 mL/hr over 30 Minutes Intravenous Every 24 hours 03/05/20 0231     03/05/20 0245  vancomycin (VANCOREADY) IVPB 1250 mg/250 mL        1,250 mg 166.7 mL/hr over 90 Minutes Intravenous  Once 03/05/20 0231 03/05/20 0525   03/05/20 0230  clindamycin (CLEOCIN) IVPB 600 mg        600  mg 100 mL/hr over 30 Minutes Intravenous Every 8 hours 03/05/20 0223 03/08/20 0559      Subjective: Today, patient was seen and examined at bedside.  Patient denies any chest pain, shortness of breath, fever, chills or rigor.  Patient was noted to have low hemoglobin and is receiving packed RBC.  Objective: Vitals:   03/06/20 0702 03/06/20 0732  BP: 132/72 125/74  Pulse: (!) 58 (!) 58  Resp: 11 10  Temp: (!) 97.4 F (36.3 C) 97.8 F (36.6 C)  SpO2:  100%    Intake/Output Summary (Last 24 hours) at 03/06/2020 0941 Last data filed at 03/06/2020 0412 Gross per 24 hour  Intake --  Output 2220 ml  Net -2220 ml   Filed Weights   03/04/20 1809 03/05/20 1639  Weight: 55.8 kg 55.3 kg   Body mass index is 17 kg/m.   Physical Exam:  GENERAL: Patient is alert awake and oriented. Not in obvious distress.  Thinly built HENT: Mild pallor noted.  Pupils equally reactive to light. Oral mucosa is moist NECK: is supple, no gross swelling noted. CHEST: Clear to auscultation. No crackles or wheezes.  Diminished breath sounds bilaterally.  CVS: S1 and S2 heard, no murmur. Regular rate and rhythm.  ABDOMEN: Soft, non-tender, bowel sounds are present.  Chronic Foley catheter in place.  Ileostomy bag in place, PEG tube in place. EXTREMITIES: Right foot with necrotic toe ulcerations, left above-knee amputation. CNS: Cranial nerves are intact. No focal motor deficits. SKIN: warm and dry  Data Review: I have personally reviewed the following laboratory data and studies,  CBC: Recent Labs  Lab 03/04/20 1821 03/05/20 0420 03/06/20 0141  WBC 12.6* 11.0* 11.1*  NEUTROABS  --  8.9*  --   HGB 7.7* 7.3* 6.8*  HCT 24.8* 23.4* 22.2*  MCV 90.8 92.9 91.0  PLT 284 233 0000000   Basic Metabolic Panel: Recent Labs  Lab 03/04/20 1821 03/05/20 0420 03/05/20 1643 03/06/20 0141  NA 126* 130* 132* 136  K 5.2* 5.6* 5.4* 5.3*  CL 89* 95* 99 101  CO2 22 20* 19* 20*  GLUCOSE 106* 138* 144* 145*  BUN  84* 84* 82* 80*  CREATININE 10.00* 9.29* 8.00* 7.44*  CALCIUM 9.7 9.4 9.5 9.2  MG  --  2.0  --  1.9  PHOS  --  8.2*  --  6.6*   Liver Function Tests: Recent Labs  Lab 03/05/20 0420 03/06/20 0141  ALBUMIN 2.4* 2.1*   No results for input(s): LIPASE, AMYLASE in the last 168 hours. No results for input(s): AMMONIA in the last 168 hours. Cardiac Enzymes: Recent Labs  Lab 03/04/20 1954 03/06/20 0141  CKTOTAL 26* 16*   BNP (last 3 results) No results for input(s): BNP in the last 8760 hours.  ProBNP (last 3 results) No results for input(s): PROBNP in the last 8760 hours.  CBG: Recent Labs  Lab 03/05/20 0723 03/05/20 1226 03/05/20 1748 03/05/20 2140 03/06/20 0734  GLUCAP 97 127* 146* 141* 127*   Recent Results (from the past 240 hour(s))  Blood culture (routine x 2)     Status: None (Preliminary result)   Collection Time: 03/04/20  7:05 PM   Specimen: BLOOD  Result Value Ref Range Status   Specimen Description BLOOD SITE NOT SPECIFIED  Final   Special Requests   Final    BOTTLES DRAWN AEROBIC AND ANAEROBIC Blood Culture results may not be optimal due to an inadequate volume of blood received in culture bottles   Culture   Final    NO GROWTH < 24 HOURS Performed at [facility], [place] [address] [place], [place] [address]    Report Status PENDING  Incomplete  Blood culture (routine x 2)     Status: None (Preliminary result)   Collection Time: 03/04/20  7:10 PM   Specimen: BLOOD  Result Value Ref Range Status   Specimen Description BLOOD BLOOD LEFT FOREARM  Final   Special Requests   Final    BOTTLES DRAWN AEROBIC AND ANAEROBIC Blood Culture results may not be optimal due to an inadequate volume of blood received in culture bottles   Culture   Final    NO GROWTH < 24 HOURS Performed at [facility], [place] [address], [place], [place] [address]    Report Status PENDING  Incomplete  SARS CORONAVIRUS 2 (TAT 6-24 HRS) Nasopharyngeal Urine, Catheterized      Status: None   Collection Time: 03/04/20  7:54 PM   Specimen: Urine, Catheterized; Nasopharyngeal  Result Value Ref Range Status   SARS Coronavirus 2 NEGATIVE NEGATIVE Final    Comment: (NOTE) SARS-CoV-2 target nucleic acids are NOT DETECTED.  The SARS-CoV-2 RNA  is generally detectable in upper and lower respiratory specimens during the acute phase of infection. Negative results do not preclude SARS-CoV-2 infection, do not rule out co-infections with other pathogens, and should not be used as the sole basis for treatment or other patient management decisions. Negative results must be combined with clinical observations, patient history, and epidemiological information. The expected result is Negative.  Fact Sheet for Patients: SugarRoll.be  Fact Sheet for Healthcare Providers: https://www.woods-mathews.com/  This test is not yet approved or cleared by the [place] FDA and  has been authorized for detection and/or diagnosis of SARS-CoV-2 by FDA under an Emergency Use Authorization (EUA). This EUA will remain  in effect (meaning this test can be used) for the duration of the COVID-19 declaration under Se ction 564(b)(1) of the Act, 21 U.S.C. section 360bbb-3(b)(1), unless the authorization is terminated or revoked sooner.  Performed at [facility], [place] [address] [place], [place] [address]   Urine culture     Status: None   Collection Time: 03/04/20  9:02 PM   Specimen: Urine, Random  Result Value Ref Range Status   Specimen Description URINE, RANDOM  Final   Special Requests NONE  Final   Culture   Final    NO GROWTH Performed at [facility], [place] [address] [place], [place] [address]    Report Status 03/06/2020 FINAL  Final  Aerobic Culture w Gram Stain (superficial specimen)     Status: None (Preliminary result)   Collection Time: 03/05/20  3:03 AM   Specimen: Wound  Result Value Ref Range Status   Specimen  Description WOUND STOMACH  Final   Special Requests NONE  Final   Gram Stain   Final    RARE WBC PRESENT, PREDOMINANTLY PMN MODERATE GRAM POSITIVE COCCI    Culture   Final    ABUNDANT STAPHYLOCOCCUS AUREUS SUSCEPTIBILITIES TO FOLLOW Performed at [facility], [place] [address], [place], [place] [address]    Report Status PENDING  Incomplete     Studies: CT ABDOMEN PELVIS WO CONTRAST  Result Date: 03/05/2020 CLINICAL DATA:  65 year old male with abnormal labs. Recurrent hyponatremia, hyperkalemia, elevated creatinine. History of complicated AAA repair last year. EXAM: CT ABDOMEN AND PELVIS WITHOUT CONTRAST TECHNIQUE: Multidetector CT imaging of the abdomen and pelvis was performed following the standard protocol without IV contrast. COMPARISON:  Renal ultrasound 03/04/2020. FINDINGS: Lower chest: No cardiomegaly or pericardial effusion. Lung bases are negative aside from mild linear atelectasis or scarring. Hepatobiliary: Streak artifact from the patient's upper extremities limits detail of the noncontrast liver. The gallbladder is contracted with internal stones or hyperdense sludge (series 3, image 23). No pericholecystic inflammation. No perihepatic free fluid. Pancreas: Negative. Spleen: Spleen size is at the upper limits of normal. No perisplenic fluid or discrete splenic lesion. Adrenals/Urinary Tract: Adrenal glands are within normal limits. Kidneys appear nonobstructed with no hydronephrosis, although there is mildly asymmetric perinephric inflammatory stranding, greater on the left and mild to moderate. Superimposed right upper pole simple fluid density renal cyst. Smaller left upper and lower pole similar cysts. Punctate right nephrolithiasis (coronal image 74). Right renal vascular calcifications also. No perinephric fluid collection. Proximal ureters are decompressed. Distal ureters are difficult to delineate. Foley catheter within the urinary bladder which is diminutive and mildly  thick-walled (5 mm) with questionable perivesical inflammatory stranding. Stomach/Bowel: Decompressed rectum is indistinct. Sigmoid colon and descending colon are decompressed. Transverse colon is decompressed, and there is a right abdominal ostomy which appears  to be a right colon colostomy. No dilated small bowel. Percutaneous gastrostomy tube in place. Mild gas and fluid in the stomach and duodenum. No free air. No free fluid. Aside from presacral stranding, no mesenteric stranding is identified in the abdomen. Vascular/Lymphatic: Highly abnormal appearance of the distal abdominal aorta, aortoiliac bifurcation and proximal iliac arteries. 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 No periaortic hematoma or inflammatory stranding. Vascular patency is not evaluated in the absence of IV contrast. No lymphadenopathy. Reproductive: Urethral catheter in place, otherwise negative. Other: Nonspecific moderate presacral stranding in the pelvis (series 3, image 65). Musculoskeletal: No acute osseous abnormality identified. IMPRESSION: 1. Nonspecific perinephric inflammatory stranding but no obstructive uropathy. Pyelonephritis cannot be evaluated on CT in the absence of IV contrast. There is no perinephric fluid collection. Punctate right nephrolithiasis noted. 2. Foley catheter in the urinary bladder with mild circumferential bladder wall thickening and questionable perivesical stranding. Consider cystitis/UTI. 3. Superimposed presacral inflammatory stranding in the pelvis, although nonspecific. Adjacent rectum is decompressed, without strong evidence of distal colitis. 4. Right abdominal ostomy with no evidence of bowel obstruction. Gastrostomy tube in place. 5. Abnormal aortoiliac bifurcation where a  densely rim calcified, bifurcated aneurysm sac measures up to 50 mm diameter. Evidence of underlying prior surgical aortoiliac repair/bypass. No periaortic hematoma or inflammation. Vascular patency is not evaluated in the absence of IV contrast. 6. Gallbladder contracted around stones or high density bile with no evidence of inflammation. Electronically Signed   By: Genevie Ann M.D.   On: 03/05/2020 04:39   DG Chest 1 View  Result Date: 03/04/2020 CLINICAL DATA:  Abnormal labs and hypotension EXAM: CHEST  1 VIEW COMPARISON:  None. FINDINGS: The heart size and mediastinal contours are within normal limits. Aortic atherosclerosis. Both lungs are clear. The visualized skeletal structures are unremarkable. IMPRESSION: No active disease. Electronically Signed   By: Donavan Foil M.D.   On: 03/04/2020 23:53   US RENAL  Result Date: 03/04/2020 CLINICAL DATA:  Acute renal disease. EXAM: RENAL / URINARY TRACT ULTRASOUND COMPLETE COMPARISON:  None. FINDINGS: Right Kidney: Renal measurements: 10.9 cm x 4.4 cm x 4.3 cm = volume: 106.26 mL. Echogenicity within normal limits. A 3.3 cm x 2.8 cm x 2.4 cm anechoic structure is seen within the upper pole of the right kidney. No abnormal flow is seen within this region on color Doppler evaluation. No hydronephrosis is visualized. Left Kidney: Renal measurements: 11.0 cm x 5.4 cm x 4.2 cm = volume: 130.80 mL. Echogenicity within normal limits. 2.8 cm x 2.0 cm x 2.1 cm and 1.8 cm x 1.5 cm x 1.7 cm anechoic structures are seen within the lower pole of the left kidney. An additional 2.1 cm x 1.9 cm x 2.5 cm anechoic structure is seen within the upper pole of the left kidney. No abnormal flow is seen within these regions on color Doppler evaluation. No hydronephrosis is visualized. Bladder: Limited in evaluation secondary to the presence of overlying bandages. Other: Of incidental note is the presence of a 4.3 cm hypoechoic area seen in between the spleen and left kidney. This area  could not be duplicated clearly in transverse plane, as per the ultrasound technologist. IMPRESSION: Multiple bilateral simple renal cysts. Electronically Signed   By: Virgina Norfolk M.D.   On: 03/04/2020 22:39  MR FOOT RIGHT WO CONTRAST  Result Date: 03/05/2020 CLINICAL DATA:  Diabetic patient with a chronic skin ulcerations on the right foot. EXAM: MRI OF THE RIGHT FOREFOOT WITHOUT CONTRAST TECHNIQUE: Multiplanar, multisequence MR imaging of the right forefoot was performed. No intravenous contrast was administered. COMPARISON:  Plain films right foot 03/05/2020 and 01/31/2020. FINDINGS: Bones/Joint/Cartilage Abnormal marrow signal is seen throughout the second through fifth toes. There is patchy marrow edema in the proximal phalanx of the great toe and base of the distal phalanx of the great toe. Also seen is patchy marrow edema in the head and neck of the first metatarsal. Mild marrow edema is seen throughout the remainder of the first metatarsal. There is a remote infarct in the head of the second metatarsal. Patchy, mild marrow edema is seen in the heads of the third, fourth and fifth metatarsals. Abnormal marrow signal in the proximal diaphysis of the fifth metatarsal is also identified and could be due to infarct or osteomyelitis; osteomyelitis is favored. There is also abnormal signal in the calcaneus consistent with a medullary infarct. Very mild, patchy air edema in the bones of the midfoot is identified. No joint effusion. Ligaments Intact. Muscles and Tendons Intermediate increased T2 signal in all imaged musculature is most consistent with diabetic myopathy. Soft tissues There is some subcutaneous edema about the foot. No abscess. Extensive bandaging is noted. IMPRESSION: Findings most consistent with osteomyelitis throughout the second through fifth toes. Patchy marrow edema in the proximal and distal phalanges of the great toe and head of the first, third, fourth and fifth is also likely due  to osteomyelitis. 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 Abnormal signal in the proximal diaphysis of the fifth metatarsal has an appearance more in keeping with medullary infarct than osteomyelitis. Mild, patchy marrow edema about the bones of the midfoot is not typical of osteomyelitis and may be due to stress or early neuropathic change. Negative for abscess or septic joint. Electronically Signed   By: Inge Rise M.D.   On: 03/05/2020 08:40   DG Foot Complete Right  Addendum Date: 03/05/2020   ADDENDUM REPORT: 03/05/2020 00:48 ADDENDUM: Critical Value/emergent results were called by telephone at the time of interpretation on 03/05/2020 at 12:48 am to provider Pollina MD, who verbally acknowledged these results. Electronically Signed   By: Lovena Le M.D.   On: 03/05/2020 00:48   Result Date: 03/05/2020 CLINICAL DATA:  Osteomyelitis EXAM: RIGHT FOOT COMPLETE - 3+ VIEW COMPARISON:  Radiograph 01/31/2020 FINDINGS: Increasingly conspicuous lucency involving the head of the fifth metacarpal and base of the fifth proximal phalanx with adjacent focal soft tissue swelling and linear lucency which may reflect a site of laceration or ulcerative change. Poorly delineated cortical margins seen along the base of the third and possibly fourth proximal phalangeal bases as well. More diffuse mild soft tissue swelling of the forefoot. More irregular ulcerative change in potential soft tissue gas is seen involving the tips of the first through fourth digits some of which may be associated with the nail beds. Bones are diffusely demineralized which significantly limits detection of subtle osseous abnormalities and injuries. Stable hallux valgus deformity with advanced arthrosis at the first MTP. Additional degenerative changes seen throughout the foot elsewhere. Small ankle joint effusion is noted. Plantar calcaneal spurring. IMPRESSION: 1. Increasingly conspicuous lucency  involving the head of the fifth metacarpal and base of the fifth proximal phalanx concerning for osteomyelitis with adjacent focal soft tissue swelling and linear  lucency which may reflect a site of laceration or ulcerative change. 2. Poorly delineated cortical margins along the base of the third and possibly fourth proximal phalangeal bases. Could reflect additional site of osteomyelitis. 3. Extensive soft tissue swelling of the forefoot with some more conspicuous irregularity of the soft tissues involving the tips of the first through fourth digits. Recommend close clinical assessment to assess for aggressive soft tissue infection. 4. 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 5. Ankle effusion without gross traumatic malalignment. Currently attempting to contact the ordering provider with a critical value result. Addendum will be submitted upon case discussion. Electronically Signed: By: Lovena Le M.D. On: 03/05/2020 00:44      Flora Lipps, MD  [facility] 03/06/2020  If 7PM-7AM, please contact night-coverage

## 2020-03-06 NOTE — Progress Notes (Addendum)
Initial Nutrition Assessment  DOCUMENTATION CODES:  Severe malnutrition in context of chronic illness  INTERVENTION:  Nepro Shake po TID, each supplement provides 425 kcal and 19 grams protein.  Initiate tube feeding via PEG: Osmolite 1.5 at 40 ml/h (480 ml per day) Prosource TF 45 ml BID  Provides 720 kcal, 30 gm protein, 364 ml free water daily.  Educated pt on importance of nutrition, emphasizing renal diet specifications. Answered questions regarding renal diet from pt.   Recommend adding rena-vite MVI daily.  Recommend liberalizing diet; to remove consistent carb restriction, to promote PO intake.  NUTRITION DIAGNOSIS:  Severe Malnutrition related to chronic illness (Open abdominal wound s/p small bowel resection) as evidenced by severe muscle depletion,severe fat depletion,percent weight loss.  GOAL:  Patient will meet greater than or equal to 90% of their needs  MONITOR:  PO intake,Supplement acceptance,TF tolerance,I & O's  REASON FOR ASSESSMENT:  Consult Poor PO,Wound healing,Other (Comment),Calorie Count (Assessment of nutrition requirements/status)  ASSESSMENT:  65 yo male with PMH of T2DM, CKD stage IV, severe PCM s/p PEG, HTN, s/p ilieostomy, chronic urinary retention, open abdominal wound, and s/p bowel resection presents from SNF with abnormal labs including recurrent hyponatremia, hyperkalemia and elevated creatinine. Note left AKA.   Spoke with pt at bedside. Pt reports having some appetite and able to eat and drink now. PTA at rehab facility, he did not enjoy the food and did not eat much there. Also at the rehab facility, they used his PEG tube for nutrition briefly but he started having high ostomy output, so they stopped the feeds. He still receives ProSource through the tube now.  As for weight, he reports his UBW is 190 lbs before 09/2019, when he had major surgery, including his L AKA. Weight is currently at 122 lbs, which is a 33% wt loss (63 lbs) from  09/2019. This calculation omits the L AKA, which is roughly an 8% loss of body weight. He also reports using a wheelchair at rehab to get around, only using him arms.  He reports not knowing what he is able to eat with his renal restriction. Walked him through some items he can have with certainty and explained about room service, so he will be sure to know what he can and cannot have. He expressed openness to continuing to drink Nepro shakes TID and to eating what he can, as he is adamant to heal. Also open to having some tube feeding through his PEG overnight to help meet his needs.  Encouraged him to eat what he can and drink his supplement shakes.  Relevant Medications: gabapentin, novolog, protonix, sodium bicarb 1300 mg BID, vancomycin, clindamycin Labs: reviewed; K 5.3, Glucose 145, Crt 7.44, Phos 6.6, Hemoglobin 6.8  NUTRITION - FOCUSED PHYSICAL EXAM: Flowsheet Row Most Recent Value  Orbital Region Severe depletion  Upper Arm Region Severe depletion  Thoracic and Lumbar Region Severe depletion  Buccal Region Severe depletion  Temple Region Severe depletion  Clavicle Bone Region Severe depletion  Clavicle and Acromion Bone Region Severe depletion  Scapular Bone Region Severe depletion  Dorsal Hand Severe depletion  Patellar Region Severe depletion  Anterior Thigh Region Severe depletion  Posterior Calf Region Severe depletion  [L AKA, only assessed R leg]  Edema (RD Assessment) None  Hair Reviewed  Eyes Reviewed  [pale conjunctiva]  Mouth Reviewed  Skin Reviewed  [dry, flaky]  Nails Reviewed  [pale nail beds]     Diet Order:   Diet Order  Diet renal with fluid restriction Fluid restriction: Other (see comments); Room service appropriate? Yes with Assist; Fluid consistency: Thin  Diet effective now                EDUCATION NEEDS:  Education needs have been addressed  Skin:  Skin Assessment: Skin Integrity Issues: Other: Wounds on L anterior and posterior  thigh, Stage II pressure ulcer on buttocks, wound on R anterior thigh, abdominal wound from previous surgery, wounds on R foot toes  Last BM:  03/05/20 - ostomy Ileostomy Output: 160 ml  Height:  Ht Readings from Last 1 Encounters:  03/05/20 '5\' 11"'$  (1.803 m)   Weight:  Wt Readings from Last 1 Encounters:  03/05/20 55.3 kg   Ideal Body Weight:  72 kg  BMI:  Body mass index is 17 kg/m.  Estimated Nutritional Needs:  Kcal:  1900-2200 Protein:  100-115 grams Fluid:  >2 L  Derrel Nip, RD, LDN Registered Dietitian After Hours/Weekend Pager # in [place]

## 2020-03-06 NOTE — Progress Notes (Signed)
[facility] Progress Note  Name: Curtis Solis MRN: [id number] DOB: 1955-06-05  Subjective:  Had 1.2 liters UOP over 2/24.  He states had foley at his facility.  He is worried about his leg.  States he got blood this AM.  States his facility made him an appt with outpatient nephro - he's not sure where.  Prev charting Dr. Shelton Silvas but he doesn't recognize name.  Review of systems:   Denies shortness of breath or chest pain Denies n/v --------- Background on consult:  This is a 65 y.o. Black or African American male who is resident of an skilled nursing facility.  He has a history of diabetes mellitus type 2, HTN, PAD, tobacco use.   He was recently discharged 02/20/2020-02/25/2020 [facility].  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  He was subsequently admitted   to [facility] with oliguric acute renal failure 1/21-1/27.  He was seen in vascular surgery clinic on 02/20/2020 from SNF for evaluation of RLE wounds and subsequently to the ED at [facility] with kyperkalemia to 7.5.  He did not require dialysis on this admission and his hyperkalemia was medically managed.  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  He was prescribed Florinef by his nephrologist Dr. Shelton Silvas.  He was sent to the emergency room from [place] facility with abnormal labs and hypotension   Intake/Output Summary (Last 24 hours) at 03/06/2020 1023 Last data filed at 03/06/2020 0945 Gross per 24 hour  Intake 342 ml  Output 2220 ml  Net -1878 ml     Vitals:  Vitals:   03/06/20 0401 03/06/20 0647 03/06/20 0702 03/06/20 0732  BP: 119/72 129/73 132/72 125/74  Pulse: 60 (!) 59 (!) 58 (!) 58  Resp: '16 13 11 10  '$ Temp: (!) 97.5 F (36.4 C) (!) 97.1 F (36.2 C) (!) 97.4 F (36.3 C) 97.8 F (36.6 C)  TempSrc: Oral Oral Oral Oral  SpO2: 100%   100%  Weight:      Height:         Physical Exam:  General adult male in bed in no acute distress HEENT normocephalic atraumatic extraocular movements intact sclera anicteric Neck supple trachea midline Lungs clear to auscultation bilaterally normal work of breathing at rest  Heart S1S2 no rub Abdomen soft nontender nondistended ostomy in place Extremities no edema  Psych normal mood and affect Neuro - alert and oriented x 3 provides hx and follows commands GU - foley in place   Medications reviewed     Labs:  BMP Latest Ref Rng & Units 03/06/2020 03/05/2020 03/05/2020  Glucose 70 - 99 mg/dL 145(H) 144(H) 138(H)  BUN 8 - 23 mg/dL 80(H) 82(H) 84(H)  Creatinine 0.61 - 1.24 mg/dL 7.44(H) 8.00(H) 9.29(H)  Sodium 135 - 145 mmol/L 136 132(L) 130(L)  Potassium 3.5 - 5.1 mmol/L 5.3(H) 5.4(H) 5.6(H)  Chloride 98 - 111 mmol/L 101 99 95(L)  CO2 22 - 32 mmol/L 20(L) 19(L) 20(L)  Calcium 8.9 - 10.3 mg/dL 9.2 9.5 9.4     Assessment/Plan:    Acute on chronic kidney disease.    Felt to be pre-renal on consult.  Patient  may also be addisonian.  Bladder scan does not reveal any evidence of obstruction.  It appears that his baseline creatinine on discharge from [facility] was about 3 mg/dL.  He has had a very complicated course with an episode of acute kidney injury requiring dialysis following his abdominal aortic aneurysm repair.  CT a/p with no obstructive uropathy  Continue foley- indwelling prior to admission per pt  Ostomy predisposes to pre-renal losses  Improving.  Continue NS - 125 ml/hr for another 24 hours and reassess    Anemia CKD in part.  aranesp 40 mcg on 2/25 . Got PRBC's  2/26. Iron ok in 02/02/20.    Hyponatremia improving. Felt depleted but also may be adrenally insufficient  Hypotension cortisol ok.  blood cultures ngtd.  covid neg  Hyperkalemia - improving.  On NS and bicarb.  Changed to renal diet.   Diabetes mellitus as per primary service  Hypoaldosteronism - cortisol ok from 04:26 am 2/24 at 23.6.  Note on solucortef here  Metabolic acidosis - continue home dose of sodium bicarb   Claudia Desanctis, MD 03/06/2020 10:44 AM

## 2020-03-07 LAB — TYPE AND SCREEN
ABO/RH(D): B POS
Antibody Screen: NEGATIVE
Unit division: 0

## 2020-03-07 LAB — GLUCOSE, CAPILLARY
Glucose-Capillary: 142 mg/dL — ABNORMAL HIGH (ref 70–99)
Glucose-Capillary: 149 mg/dL — ABNORMAL HIGH (ref 70–99)
Glucose-Capillary: 150 mg/dL — ABNORMAL HIGH (ref 70–99)
Glucose-Capillary: 151 mg/dL — ABNORMAL HIGH (ref 70–99)
Glucose-Capillary: 155 mg/dL — ABNORMAL HIGH (ref 70–99)
Glucose-Capillary: 192 mg/dL — ABNORMAL HIGH (ref 70–99)

## 2020-03-07 LAB — VANCOMYCIN, RANDOM: Vancomycin Rm: 18

## 2020-03-07 LAB — BPAM RBC
Blood Product Expiration Date: 202203142359
ISSUE DATE / TIME: 202202250642
Unit Type and Rh: 7300

## 2020-03-07 LAB — MAGNESIUM: Magnesium: 1.6 mg/dL — ABNORMAL LOW (ref 1.7–2.4)

## 2020-03-07 LAB — AEROBIC CULTURE W GRAM STAIN (SUPERFICIAL SPECIMEN)

## 2020-03-07 LAB — BASIC METABOLIC PANEL
Anion gap: 9 (ref 5–15)
BUN: 71 mg/dL — ABNORMAL HIGH (ref 8–23)
CO2: 18 mmol/L — ABNORMAL LOW (ref 22–32)
Calcium: 8.7 mg/dL — ABNORMAL LOW (ref 8.9–10.3)
Chloride: 106 mmol/L (ref 98–111)
Creatinine, Ser: 5.27 mg/dL — ABNORMAL HIGH (ref 0.61–1.24)
GFR, Estimated: 12 mL/min — ABNORMAL LOW (ref >60)
Glucose, Bld: 153 mg/dL — ABNORMAL HIGH (ref 70–99)
Potassium: 4.4 mmol/L (ref 3.5–5.1)
Sodium: 133 mmol/L — ABNORMAL LOW (ref 135–145)

## 2020-03-07 LAB — PHOSPHORUS: Phosphorus: 5.1 mg/dL — ABNORMAL HIGH (ref 2.5–4.6)

## 2020-03-07 LAB — CBC
HCT: 24.8 % — ABNORMAL LOW (ref 39.0–52.0)
Hemoglobin: 7.9 g/dL — ABNORMAL LOW (ref 13.0–17.0)
MCH: 28.5 pg (ref 26.0–34.0)
MCHC: 31.9 g/dL (ref 30.0–36.0)
MCV: 89.5 fL (ref 80.0–100.0)
Platelets: 220 10*3/uL (ref 150–400)
RBC: 2.77 MIL/uL — ABNORMAL LOW (ref 4.22–5.81)
RDW: 16.8 % — ABNORMAL HIGH (ref 11.5–15.5)
WBC: 11 10*3/uL — ABNORMAL HIGH (ref 4.0–10.5)
nRBC: 0 % (ref 0.0–0.2)

## 2020-03-07 MED ORDER — VANCOMYCIN HCL 1000 MG/200ML IV SOLN
1000.0000 mg | Freq: Once | INTRAVENOUS | Status: AC
  Administered 2020-03-07: 1000 mg via INTRAVENOUS
  Filled 2020-03-07: qty 200

## 2020-03-07 MED ORDER — MAGNESIUM OXIDE 400 (241.3 MG) MG PO TABS
400.0000 mg | ORAL_TABLET | Freq: Two times a day (BID) | ORAL | Status: DC
  Administered 2020-03-07 – 2020-03-08 (×2): 400 mg via ORAL
  Filled 2020-03-07 (×2): qty 1

## 2020-03-07 NOTE — Progress Notes (Signed)
[facility] Progress Note  Name: Curtis Solis MRN: [id number] DOB: 08/28/1955  Subjective:  Had 0.9 liters UOP charted over 2/25.  Feels ok today. Updated his son via speakerphone.  We discussed low k diet and he's going to try to push oral fluids.   Review of systems:    Denies shortness of breath or chest pain Denies n/v --------- Background on consult:  This is a 65 y.o. Black or African American male who is resident of an skilled nursing facility.  He has a history of diabetes mellitus type 2, HTN, PAD, tobacco use.   He was recently discharged 02/20/2020-02/25/2020 [facility].  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  He was subsequently admitted   to [facility] with oliguric acute renal failure 1/21-1/27.  He was seen in vascular surgery clinic on 02/20/2020 from SNF for evaluation of RLE wounds and subsequently to the ED at [facility] with kyperkalemia to 7.5.  He did not require dialysis on this admission and his hyperkalemia was medically managed.  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  He was prescribed Florinef by his nephrologist Dr. Shelton Silvas.  He was sent to the emergency room from [place] facility with abnormal labs and hypotension   Intake/Output Summary (Last 24 hours) at 03/07/2020 1259 Last data filed at 03/06/2020 2219 Gross per 24 hour  Intake --  Output 1700 ml  Net -1700 ml    Vitals:  Vitals:   03/07/20 0000 03/07/20 0400 03/07/20 0755 03/07/20 1154  BP: (!) 142/87 (!) 152/75  (!) 153/79 133/77  Pulse: 70 61 63 80  Resp: (!) '21 14 19 14  '$ Temp: (!) 97.5 F (36.4 C) 98.1 F (36.7 C) (!) 97.4 F (36.3 C) 97.6 F (36.4 C)  TempSrc: Axillary Axillary Oral Axillary  SpO2: 100% 100% 100% 100%  Weight:      Height:         Physical Exam:   General adult male in bed in no acute distress HEENT normocephalic atraumatic extraocular movements intact sclera anicteric Neck supple trachea midline Lungs clear to auscultation bilaterally normal work of breathing at rest  Heart S1S2 no rub Abdomen soft nontender nondistended ostomy in place Extremities no edema  Psych normal mood and affect Neuro - alert and oriented x 3 provides hx and follows commands GU - foley in place   Medications reviewed     Labs:  BMP Latest Ref Rng & Units 03/07/2020 03/06/2020 03/05/2020  Glucose 70 - 99 mg/dL 153(H) 145(H) 144(H)  BUN 8 - 23 mg/dL 71(H) 80(H) 82(H)  Creatinine 0.61 - 1.24 mg/dL 5.27(H) 7.44(H) 8.00(H)  Sodium 135 - 145 mmol/L 133(L) 136 132(L)  Potassium 3.5 - 5.1 mmol/L 4.4 5.3(H) 5.4(H)  Chloride 98 - 111 mmol/L 106 101 99  CO2 22 - 32 mmol/L 18(L) 20(L) 19(L)  Calcium 8.9 - 10.3 mg/dL 8.7(L) 9.2 9.5     Assessment/Plan:    Acute on chronic kidney disease.    Felt to be pre-renal on consult.  Recurrent AKI and ostomy prediposes to pre-renal insults.  Patient may also be addisonian.  Bladder scan does not reveal any evidence  of obstruction.  It appears that his baseline creatinine on discharge from [facility] was about 3 mg/dL.  He has had a very complicated course with an episode of acute kidney injury requiring dialysis following his abdominal aortic aneurysm repair.  CT a/p with no obstructive uropathy  Continue foley- indwelling prior to admission per pt  Improving.  Continue NS - 125 ml/hr for another 24 hours and reassess.    Unsure if routine fluid administration would be possible at his SNF to help prevent AKI episodes.  Limited reserve with CKD    Anemia CKD in part.  aranesp 40 mcg on 2/25 . Got PRBC's 2/26. Iron ok in 02/02/20.  May need additional blood   Hyponatremia. Felt depleted - improving.   Hypotension cortisol ok.  blood cultures ngtd.  covid neg  Hyperkalemia - improving.  On NS and bicarb.  Changed to renal diet.   Diabetes mellitus as per primary service  Hypoaldosteronism - cortisol ok from 04:26 am 2/24 at 23.6.  Note on solucortef here  Metabolic acidosis - continue home dose of sodium bicarb   Claudia Desanctis, MD 03/07/2020 1:24 PM

## 2020-03-07 NOTE — Consult Note (Signed)
ORTHOPAEDIC CONSULTATION  REQUESTING PHYSICIAN: Flora Lipps, MD  PCP:  [name], MD  Chief Complaint: Right foot osteomyelitis  HPI: Curtis Solis is a 65 y.o. male who complains of right foot pain and sores that have been managed now for a little over 3-1/2 weeks.  Initially treated at [facility] back at the beginning of the month.  He does state that he has been dealing with multiple other issues eluding renal failure as well as abdominal issues, abdominal aortic aneurysm with aortic thrombus and less than 1 year ago a left knee above knee amputation performed in [facility].  He was living in a nursing facility when he had worsening pain in the right foot.  He was evaluated earlier this week by my colleague Dr. Meridee Score who recommended transmetatarsal amputation.  I was consulted today for second opinion.  The patient states that he is having some hesitancy and reservations about proceeding with another amputation in such short succession.  Past Medical History:  Diagnosis Date  . A-fib [facility])    During hospital stay  . CKD (chronic kidney disease)   . Hyperlipidemia   . Hyperthyroidism   . PVD (peripheral vascular disease) ([place])    Past Surgical History:  Procedure Laterality Date  . ABDOMINAL AORTIC ANEURYSM REPAIR    . ABOVE KNEE LEG AMPUTATION Left   . BOWEL RESECTION     for dead bowel, s/p open abdomen  . COLOSTOMY    . GASTROSTOMY    . left leg amputation    . PERICARDIAL FLUID DRAINAGE     Placed for tamponade and removed during the [facility] hospitalization   Social History   Socioeconomic History  . Marital status: Married    Spouse name: Not on file  . Number of children: Not on file  . Years of education: Not on file  . Highest education level: Not on file  Occupational History  . Not on file  Tobacco Use  . Smoking status: Former Smoker    Quit date: 09/26/2019    Years since quitting: 0.4  . Smokeless tobacco: Never  Used  Substance and Sexual Activity  . Alcohol use: Yes  . Drug use: Not on file  . Sexual activity: Not on file  Other Topics Concern  . Not on file  Social History Narrative  . Not on file   Social Determinants of Health   Financial Resource Strain: Not on file  Food Insecurity: Not on file  Transportation Needs: Not on file  Physical Activity: Not on file  Stress: Not on file  Social Connections: Not on file   Family History  Problem Relation Age of Onset  . Heart disease Other    No Known Allergies Prior to Admission medications   Medication Sig Start Date End Date Taking? Authorizing Provider  acetaminophen (TYLENOL) 500 MG tablet Take 1,000 mg by mouth every 6 (six) hours as needed for mild pain (or headaches).   Yes [provider]  atorvastatin (LIPITOR) 40 MG tablet Take 40 mg by mouth daily at 6 PM.   Yes [provider]  calcium carbonate (TUMS - DOSED IN MG ELEMENTAL CALCIUM) 500 MG chewable tablet Chew 1 tablet by mouth every 8 (eight) hours as needed for indigestion or heartburn.   Yes [provider]  carboxymethylcellulose (REFRESH PLUS) 0.5 % SOLN Place 2 drops into both eyes every 12 (twelve) hours as needed (for dryness).   Yes [provider]  gabapentin (NEURONTIN) 100 MG capsule Take 200 mg by mouth at bedtime.   Yes [provider]  ipratropium-albuterol (DUONEB) 0.5-2.5 (3) MG/3ML SOLN Take 3 mLs by nebulization every 6 (six) hours as needed (wheezing/cough/shortness of breath).   Yes [provider]  metoprolol tartrate (LOPRESSOR) 25 MG tablet Take 12.5 mg by mouth 2 (two) times daily.   Yes [provider]  NON FORMULARY Take 240 mLs by mouth See admin instructions. Novasource- Drink 240 ml's by mouth two times a day   Yes [provider]  ondansetron (ZOFRAN) 4 MG tablet Take 4 mg by mouth every 4 (four) hours as needed for nausea or vomiting.   Yes [provider]   oxyCODONE (ROXICODONE) 5 MG/5ML solution Take 5 mLs (5 mg total) by mouth every 4 (four) hours as needed (pain). 02/05/20  Yes Charlynne Cousins, MD  pantoprazole (PROTONIX) 20 MG tablet Take 20 mg by mouth daily before breakfast.   Yes [provider]  simethicone (MYLICON) 80 MG chewable tablet Chew 80 mg by mouth every 8 (eight) hours as needed for flatulence.   Yes [provider]  sodium bicarbonate 650 MG tablet Take 2 tablets (1,300 mg total) by mouth 2 (two) times daily. 02/05/20  Yes Charlynne Cousins, MD  sodium zirconium cyclosilicate (LOKELMA) 5 g packet Take 15 g by mouth in the morning.   Yes [provider]  traMADol (ULTRAM) 50 MG tablet Take 50 mg by mouth every 12 (twelve) hours as needed (for pain).   Yes [provider]  Water For Irrigation, Sterile (FREE WATER) SOLN Place 300 mLs into feeding tube every 4 (four) hours. Patient taking differently: Place 150 mLs into feeding tube See admin instructions. Flush G-Tube with 150 ml's of water four times a day 02/05/20  Yes Charlynne Cousins, MD  fludrocortisone (FLORINEF) 0.1 MG tablet Take 0.1 mg by mouth daily.    [provider]  Nutritional Supplements (FEEDING SUPPLEMENT, OSMOLITE 1.5 CAL,) LIQD Place 780 mLs into feeding tube continuous. Patient not taking: Reported on 03/05/2020 02/05/20   Charlynne Cousins, MD  Nutritional Supplements (FEEDING SUPPLEMENT, PROSOURCE TF,) liquid Place 45 mLs into feeding tube 3 (three) times daily. Patient not taking: Reported on 03/05/2020 02/05/20   Charlynne Cousins, MD   No results found.  Positive ROS: All other systems have been reviewed and were otherwise negative with the exception of those mentioned in the HPI and as above.  Physical Exam: General: Alert, no acute distress Cardiovascular: No pedal edema Respiratory: No cyanosis, no use of accessory musculature GI: No organomegaly, abdomen is soft and non-tender Skin: No  lesions in the area of chief complaint Neurologic: Sensation intact distally Psychiatric: Patient is competent for consent with normal mood and affect Lymphatic: No axillary or cervical lymphadenopathy  MUSCULOSKELETAL:  Right lower extremity:  He has an Unna boot on and some wrapping noted around the foot.  The toes are all gangrenous in appearance.  He does have sensation in the toes and endorses significant pain.  Assessment: Wet gangrene appearance of toes hallux through small  Plan: -I have reviewed the medical record and the recommendations as outlined by Meridee Score.  My recommendation would be to proceed with a transmetatarsal amputation of the right foot.  This would help for source control of potential superinfections that may become systemic.  This was also hopefully leave him with a good leg to stand on and ambulate with.  I have concerns that  if he delays this the infectious process and deterioration of that foot will go proximal and he will require a higher level amputation.  This would make ambulation much more difficult given the left-sided AKA.  -I render my opinion to the patient.  He still has some concerns and would like to continue to think about his options.  He will let the primary team know Monday how he would like to proceed.  Thank you for this interesting consultation I will sign off and defer future care to Dr. Meridee Score.    Nicholes Stairs, MD Cell [phone]    03/07/2020 8:59 PM

## 2020-03-07 NOTE — Progress Notes (Addendum)
Pharmacy Antibiotic Note  Curtis Solis is a 65 y.o. male admitted on 03/04/2020 with wound infection.  Pharmacy has been consulted for Vancomycin/Merrem dosing. WBC 12.6. Acute on chronic kidney disease, not on HD.   2/26 AM update:   Vancomycin random level 48 hours after a single does of Vancomycin 1250 mg is 18  Scr coming down some (10>>8>>7.44>>5.27)  Plan: Re-dose vancomycin 1000 mg IV x 1  Re-check another random vancomycin level in 48 hours Cont Merrem at current dose Cont Clindamycin per MD    Height: '5\' 11"'$  (180.3 cm) Weight: 55.3 kg (121 lb 14.6 oz) IBW/kg (Calculated) : 75.3  Temp (24hrs), Avg:97.5 F (36.4 C), Min:97.1 F (36.2 C), Max:97.8 F (36.6 C)  Recent Labs  Lab 03/04/20 1821 03/04/20 2039 03/05/20 0420 03/05/20 1643 03/06/20 0141 03/07/20 0130  WBC 12.6*  --  11.0*  --  11.1* 11.0*  CREATININE 10.00*  --  9.29* 8.00* 7.44*  --   LATICACIDVEN  --  1.1  --   --   --   --   VANCORANDOM  --   --   --   --   --  18    Estimated Creatinine Clearance: 8.1 mL/min (A) (by C-G formula based on SCr of 7.44 mg/dL (H)).    No Known Allergies  Narda Bonds, PharmD, BCPS Clinical Pharmacist Phone: [phone]

## 2020-03-07 NOTE — Progress Notes (Signed)
PROGRESS NOTE  Curtis Solis [id number] DOB: February 07, 1955 DOA: 03/04/2020 PCP: [name], MD   LOS: 3 days   Brief narrative:  65 year old male with past medical history of diabetes mellitus type 2, CKD stage IV, severe protein calorie malnutrition (S/P PEG), hypertension, chronic urinary retention with indwelling Foley catheter presented to to the hospital from [facility] SNF via EMS for abnormal labs including recurrent hyponatremia, hyperkalemia and elevated creatinine.  Patient does have a history of complicated hospitalization in the past after AAA repair.  Patient had acute kidney injury at that time and required tunneled hemodialysis catheter placement and CRRT.  He also had mesenteric ischemia and underwent small bowel resection post ileostomy at this time.  He was intubated and required pericardiocentesis and drain placement on October 2021.   Patient additionally developed necrosis of the foreskin of the penis with urinary retention requiring blunt dissection and placement of a Foley catheter.  Patient eventually recovered, was extubated and was able to be transitioned off of renal replacement therapy in November  with a new baseline creatinine of approximately 3.0 (CKD IV).  Patient was discharged to [facility] 11/25/2019.  With renal function improvement, patient was eventually discharged on 02/25/20 back to [facility] skilled nursing facility.    After discharge to skilled nursing facility, patient continued to have generalized weakness.  A routine blood work done in the skilled nursing facility revealed hyperkalemia, hyponatremia and acute kidney injury.  In the ED, patient was noted to have creatinine of 10.0 BUN of 84, mild hyperkalemia 5.2 with hyponatremia of 126.  Patient was also found to be hypotensive with initial systolic blood pressures in the 80s.  Lactate was within normal limits.  Bladder scan and bilateral renal ultrasound performed the emergency department revealed no  evidence of outlet obstruction.    Due to concern for Addison's disease patient was given hydrocortisone and was admitted hospital for further evaluation and treatment.   Patient was seen by orthopedics Dr. Sharol Given who recommended transmetatarsal amputation.     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    Improving.  Continue Foley catheter.  Nephrology on board.  Daily intake and output charting.  On IV fluids.  Lab Results  Component Value Date   CREATININE 5.27 (H) 03/07/2020   CREATININE 7.44 (H) 03/06/2020   CREATININE 8.00 (H) 03/05/2020    Hypotension.    improved at this time.  On physiological dose of hydrocortisone.  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    Patient was recently evaluated by vascular surgery at [facility] on 2/15 and had unremarkable ABI.   x-ray of right foot with lucency involving the fifth metacarpal and proximal phalanx concerning for osteomyelitis.  Extensive soft tissue swelling in the forefoot.    On clindamycin,  vancomycin and Merrem.  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  Orthopedics Dr Sharol Given has seen the patient and recommending transmetatarsal amputation. I spoke with the patient about it. Patient wishes to  have a second opinion prior to his decision surgery.Marland Kitchen Spoke with Dr. Stann Mainland orthopedics on call regarding second opinion.  Chronic urinary retention/open wound of penis.  Currently on a Foley catheter.  We will continue.  Mild recurrent hyponatremia.    Continue to monitor.  Hyperkalemia mild.  Has improved.  Potassium of 4.4 today.  Hypomagnesemia.  Will replace orally today. Check levels in a.m.  Chronic abdominal wound greenish discharge with ileostomy.  Wound care on board. On  broad-spectrum antibiotics.  Severe protein calorie malnutrition.    Present on admission.  Continue nutritional supplements.    Anemia of chronic kidney disease.  Patient received 1 unit of packed RBC for low hemoglobin.  Hemoglobin of 7.9 today.  GERD.  Continue PPI.  Diabetes mellitus II with CKD.  Continue Accu-Cheks, diabetic diet sliding scale insulin.  Latest POC glucose of 155  DVT prophylaxis: heparin injection 5,000 Units Start: 03/05/20 0600   Code Status: Full code  Family Communication: None  Status is: Inpatient  Remains inpatient appropriate because:IV treatments appropriate due to intensity of illness or inability to take PO and Inpatient level of care appropriate due to severity of illness, IV antibiotics, need for transmetatarsal amputation   Dispo: The patient is from: Skilled nursing facility              Anticipated d/c is to: Skilled nursing facility Anticipated discharge date 3 to 4 days              Patient currently is not medically stable to d/c.   Difficult to place patient No   Consultants:  Orthopedics  Nephrology  Procedures:  Transfusion of packed RBC.  Anti-infectives:  Marland Kitchen Vancomycin Merrem and clindamycin 2/24>  Anti-infectives (From admission, onward)   Start     Dose/Rate Route Frequency Ordered Stop   03/07/20 0315  vancomycin (VANCOREADY) IVPB 1000 mg/200 mL        1,000 mg 200 mL/hr over 60 Minutes Intravenous  Once 03/07/20 0223 03/07/20  0449   03/06/20 0000  vancomycin variable dose per unstable renal function (pharmacist dosing)         Does not apply See admin instructions 03/05/20 0926     03/05/20 0300  meropenem (MERREM) 1 g in sodium chloride 0.9 % 100 mL IVPB        1 g 200 mL/hr over 30 Minutes Intravenous Every 24 hours 03/05/20 0231     03/05/20 0245  vancomycin (VANCOREADY) IVPB 1250 mg/250 mL        1,250 mg 166.7 mL/hr over 90 Minutes Intravenous  Once 03/05/20 0231 03/05/20 0525   03/05/20 0230  clindamycin (CLEOCIN) IVPB 600 mg        600 mg 100 mL/hr over 30 Minutes Intravenous Every 8 hours 03/05/20 0223 03/08/20 0559      Subjective: Today, patient was seen and examined at bedside. Patient denies any chest pain, shortness of breath, fever, chills or rigors. Had received packed RBC yesterday. Denies overt pain. Wishes to have a second opinion regarding transmetatarsal amputation.   Objective: Vitals:   03/07/20 0400 03/07/20 0755  BP: (!) 152/75 (!) 153/79  Pulse: 61 63  Resp: 14 19  Temp: 98.1 F (36.7 C) (!) 97.4 F (36.3 C)  SpO2: 100% 100%    Intake/Output Summary (Last 24 hours) at 03/07/2020 0852 Last data filed at 03/06/2020 2219 Gross per 24 hour  Intake 342 ml  Output 1700 ml  Net -1358 ml   Filed Weights   03/04/20 1809 03/05/20 1639  Weight: 55.8 kg 55.3 kg   Body mass index is 17 kg/m.   Physical Exam: General: Thinly built, not in obvious distress HENT: Mild pallor noted. Oral mucosa is moist.  Chest:  Clear breath sounds.  Diminished breath sounds bilaterally. No crackles or wheezes.  CVS: S1 &S2 heard. No murmur.  Regular rate and rhythm. Abdomen: Soft, nontender, abdominal wound with dressing.  Bowel sounds are heard.  PEG tube in place, ileostomy bag in place, chronic Foley catheter Extremities:   Right foot with necrotic toe ulcerations, status post left above-knee amputation. Psych: Alert, awake and oriented, normal mood CNS:  No cranial nerve deficits.  Power  equal in all extremities.   Skin: Warm and dry.  No rashes noted.   Data Review: I have personally reviewed the following laboratory data and studies,  CBC: Recent Labs  Lab 03/04/20 1821 03/05/20 0420 03/06/20 0141 03/07/20 0130  WBC 12.6* 11.0* 11.1* 11.0*  NEUTROABS  --  8.9*  --   --   HGB 7.7* 7.3* 6.8* 7.9*  HCT 24.8* 23.4* 22.2* 24.8*  MCV 90.8 92.9 91.0 89.5  PLT 284 233 261 [id number]   Basic Metabolic Panel: Recent Labs  Lab 03/04/20 1821 03/05/20 0420 03/05/20 1643 03/06/20 0141 03/07/20 0130  NA 126* 130* 132* 136 133*  K 5.2* 5.6* 5.4* 5.3* 4.4  CL 89* 95* 99 101 106  CO2 22 20* 19* 20* 18*  GLUCOSE 106* 138* 144* 145* 153*  BUN 84* 84* 82* 80* 71*  CREATININE 10.00* 9.29* 8.00* 7.44* 5.27*  CALCIUM 9.7 9.4 9.5 9.2 8.7*  MG  --  2.0  --  1.9 1.6*  PHOS  --  8.2*  --  6.6* 5.1*   Liver Function Tests: Recent Labs  Lab 03/05/20 0420 03/06/20 0141  ALBUMIN 2.4* 2.1*   No results for input(s): LIPASE, AMYLASE in the last 168 hours. No results for input(s): AMMONIA in the last 168 hours. Cardiac Enzymes: Recent Labs  Lab 03/04/20 1954 03/06/20 0141  CKTOTAL 26* 16*   BNP (last 3 results) No results for input(s): BNP in the last 8760 hours.  ProBNP (last 3 results) No results for input(s): PROBNP in the last 8760 hours.  CBG: Recent Labs  Lab 03/06/20 1151 03/06/20 1704 03/06/20 2201 03/07/20 0407 03/07/20 0757  GLUCAP 106* 165* 175* 142* 155*   Recent Results (from the past 240 hour(s))  Blood culture (routine x 2)     Status: None (Preliminary result)   Collection Time: 03/04/20  7:05 PM   Specimen: BLOOD  Result Value Ref Range Status   Specimen Description BLOOD SITE NOT SPECIFIED  Final   Special Requests   Final    BOTTLES DRAWN AEROBIC AND ANAEROBIC Blood Culture results may not be optimal due to an inadequate volume of blood received in culture bottles   Culture   Final    NO GROWTH 2 DAYS Performed at [facility],  [place] [address] [place], [place] [address]    Report Status PENDING  Incomplete  Blood culture (routine x 2)     Status: None (Preliminary result)   Collection Time: 03/04/20  7:10 PM   Specimen: BLOOD  Result Value Ref Range Status  Specimen Description BLOOD BLOOD LEFT FOREARM  Final   Special Requests   Final    BOTTLES DRAWN AEROBIC AND ANAEROBIC Blood Culture results may not be optimal due to an inadequate volume of blood received in culture bottles   Culture   Final    NO GROWTH 2 DAYS Performed at [facility], [place] [address] [place], [place] [address]    Report Status PENDING  Incomplete  SARS CORONAVIRUS 2 (TAT 6-24 HRS) Nasopharyngeal Urine, Catheterized     Status: None   Collection Time: 03/04/20  7:54 PM   Specimen: Urine, Catheterized; Nasopharyngeal  Result Value Ref Range Status   SARS Coronavirus 2 NEGATIVE NEGATIVE Final    Comment: (NOTE) SARS-CoV-2 target nucleic acids are NOT DETECTED.  The SARS-CoV-2 RNA is generally detectable in upper and lower respiratory specimens during the acute phase of infection. Negative results do not preclude SARS-CoV-2 infection, do not rule out co-infections with other pathogens, and should not be used as the sole basis for treatment or other patient management decisions. Negative results must be combined with clinical observations, patient history, and epidemiological information. The expected result is Negative.  Fact Sheet for Patients: SugarRoll.be  Fact Sheet for Healthcare Providers: https://www.woods-mathews.com/  This test is not yet approved or cleared by the [place] FDA and  has been authorized for detection and/or diagnosis of SARS-CoV-2 by FDA under an Emergency Use Authorization (EUA). This EUA will remain  in effect (meaning this test can be used) for the duration of the COVID-19 declaration under Se ction 564(b)(1) of the Act, 21 U.S.C. section 360bbb-3(b)(1),  unless the authorization is terminated or revoked sooner.  Performed at [facility], [place] [address], [place], [place] [address]   Urine culture     Status: None   Collection Time: 03/04/20  9:02 PM   Specimen: Urine, Random  Result Value Ref Range Status   Specimen Description URINE, RANDOM  Final   Special Requests NONE  Final   Culture   Final    NO GROWTH Performed at [facility], [place] [address], [place], [place] [address]    Report Status 03/06/2020 FINAL  Final  Aerobic Culture w Gram Stain (superficial specimen)     Status: None (Preliminary result)   Collection Time: 03/05/20  3:03 AM   Specimen: Wound  Result Value Ref Range Status   Specimen Description WOUND STOMACH  Final   Special Requests NONE  Final   Gram Stain   Final    RARE WBC PRESENT, PREDOMINANTLY PMN MODERATE GRAM POSITIVE COCCI    Culture   Final    ABUNDANT STAPHYLOCOCCUS AUREUS SUSCEPTIBILITIES TO FOLLOW Performed at [facility], [place] [address] [place], [place] [address]    Report Status PENDING  Incomplete     Studies: No results found.    Flora Lipps, MD  [facility] 03/07/2020  If 7PM-7AM, please contact night-coverage

## 2020-03-08 LAB — BASIC METABOLIC PANEL
Anion gap: 10 (ref 5–15)
BUN: 54 mg/dL — ABNORMAL HIGH (ref 8–23)
CO2: 15 mmol/L — ABNORMAL LOW (ref 22–32)
Calcium: 7.4 mg/dL — ABNORMAL LOW (ref 8.9–10.3)
Chloride: 114 mmol/L — ABNORMAL HIGH (ref 98–111)
Creatinine, Ser: 3.34 mg/dL — ABNORMAL HIGH (ref 0.61–1.24)
GFR, Estimated: 20 mL/min — ABNORMAL LOW (ref >60)
Glucose, Bld: 172 mg/dL — ABNORMAL HIGH (ref 70–99)
Potassium: 3.4 mmol/L — ABNORMAL LOW (ref 3.5–5.1)
Sodium: 139 mmol/L (ref 135–145)

## 2020-03-08 LAB — CBC
HCT: 23.8 % — ABNORMAL LOW (ref 39.0–52.0)
Hemoglobin: 7.7 g/dL — ABNORMAL LOW (ref 13.0–17.0)
MCH: 29.2 pg (ref 26.0–34.0)
MCHC: 32.4 g/dL (ref 30.0–36.0)
MCV: 90.2 fL (ref 80.0–100.0)
Platelets: 230 10*3/uL (ref 150–400)
RBC: 2.64 MIL/uL — ABNORMAL LOW (ref 4.22–5.81)
RDW: 16.8 % — ABNORMAL HIGH (ref 11.5–15.5)
WBC: 10.1 10*3/uL (ref 4.0–10.5)
nRBC: 0 % (ref 0.0–0.2)

## 2020-03-08 LAB — GLUCOSE, CAPILLARY
Glucose-Capillary: 173 mg/dL — ABNORMAL HIGH (ref 70–99)
Glucose-Capillary: 165 mg/dL — ABNORMAL HIGH (ref 70–99)
Glucose-Capillary: 154 mg/dL — ABNORMAL HIGH (ref 70–99)
Glucose-Capillary: 133 mg/dL — ABNORMAL HIGH (ref 70–99)
Glucose-Capillary: 134 mg/dL — ABNORMAL HIGH (ref 70–99)
Glucose-Capillary: 137 mg/dL — ABNORMAL HIGH (ref 70–99)

## 2020-03-08 LAB — MAGNESIUM: Magnesium: 1.1 mg/dL — ABNORMAL LOW (ref 1.7–2.4)

## 2020-03-08 MED ORDER — SODIUM CHLORIDE 0.9 % IV SOLN: INTRAVENOUS | Status: AC

## 2020-03-08 MED ORDER — MELATONIN 5 MG PO TABS
5.0000 mg | ORAL_TABLET | Freq: Every day | ORAL | Status: DC
  Administered 2020-03-08 – 2020-03-12 (×5): 5 mg via ORAL
  Filled 2020-03-08 (×7): qty 1

## 2020-03-08 MED ORDER — SODIUM CHLORIDE 0.9 % IV SOLN
1.0000 g | Freq: Two times a day (BID) | INTRAVENOUS | Status: DC
  Administered 2020-03-08 – 2020-03-09 (×3): 1 g via INTRAVENOUS
  Filled 2020-03-08 (×4): qty 1

## 2020-03-08 MED ORDER — MAGNESIUM SULFATE 2 GM/50ML IV SOLN
2.0000 g | INTRAVENOUS | Status: AC
  Administered 2020-03-08 (×2): 2 g via INTRAVENOUS
  Filled 2020-03-08 (×2): qty 50

## 2020-03-08 MED ORDER — FLUDROCORTISONE ACETATE 0.1 MG PO TABS
0.1000 mg | ORAL_TABLET | Freq: Every day | ORAL | Status: DC
  Administered 2020-03-08 – 2020-03-14 (×7): 0.1 mg via ORAL
  Filled 2020-03-08 (×7): qty 1

## 2020-03-08 MED ORDER — POTASSIUM CHLORIDE CRYS ER 20 MEQ PO TBCR: 40.0000 meq | EXTENDED_RELEASE_TABLET | Freq: Once | ORAL | Status: DC

## 2020-03-08 MED ORDER — LACTATED RINGERS IV SOLN: INTRAVENOUS | Status: AC

## 2020-03-08 NOTE — Progress Notes (Signed)
[facility] Progress Note  Name: Ayaansh Juras MRN: [id number] DOB: 09-26-1955  Subjective:  Had 2.2 liters UOP charted over 2/26.  He's worried about his right foot and I asked him to discuss with primary team.  Glad to hear about kidneys.     Review of systems:  Denies shortness of breath or chest pain Denies n/v --------- Background on consult:  This is a 66 y.o. Black or African American male who is resident of an skilled nursing facility.  He has a history of diabetes mellitus type 2, HTN, PAD, tobacco use.   He was recently discharged 02/20/2020-02/25/2020 [facility].  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  He was subsequently admitted   to [facility] with oliguric acute renal failure 1/21-1/27.  He was seen in vascular surgery clinic on 02/20/2020 from SNF for evaluation of RLE wounds and subsequently to the ED at [facility] with kyperkalemia to 7.5.  He did not require dialysis on this admission and his hyperkalemia was medically managed.  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  He was prescribed Florinef by his nephrologist Dr. Shelton Silvas.  He was sent to the emergency room from [facility] facility with abnormal labs and hypotension   Intake/Output Summary (Last 24 hours) at 03/08/2020 1057 Last data filed at 03/08/2020 0629 Gross per 24 hour  Intake 8708.38 ml  Output 3975 ml  Net 4733.38 ml    Vitals:  Vitals:   03/07/20 2000 03/07/20 2332 03/08/20 0343 03/08/20 0740  BP: 128/62 (!) 154/78 (!)  146/74 (!) 166/80  Pulse: 62 62 62 80  Resp: '12 16 15 16  '$ Temp: 97.8 F (36.6 C) 97.6 F (36.4 C) 97.8 F (36.6 C) (!) 97 F (36.1 C)  TempSrc: Axillary Axillary Axillary Oral  SpO2: 100% 100% 100% 100%  Weight:      Height:         Physical Exam:  General adult male in bed in no acute distress HEENT normocephalic atraumatic extraocular movements intact sclera anicteric Neck supple trachea midline Lungs clear to auscultation bilaterally normal work of breathing at rest  Heart S1S2 no rub Abdomen soft nontender nondistended ostomy in place Extremities no edema right leg or residual left limb. Left BKA Psych normal mood and affect Neuro - alert and oriented x 3 provides hx and follows commands GU - foley in place   Medications reviewed     Labs:  BMP Latest Ref Rng & Units 03/08/2020 03/07/2020 03/06/2020  Glucose 70 - 99 mg/dL 172(H) 153(H) 145(H)  BUN 8 - 23 mg/dL 54(H) 71(H) 80(H)  Creatinine 0.61 - 1.24 mg/dL 3.34(H) 5.27(H) 7.44(H)  Sodium 135 - 145 mmol/L 139 133(L) 136  Potassium 3.5 - 5.1 mmol/L 3.4(L) 4.4 5.3(H)  Chloride 98 - 111 mmol/L 114(H) 106 101  CO2 22 - 32 mmol/L 15(L) 18(L) 20(L)  Calcium 8.9 - 10.3 mg/dL 7.4(L) 8.7(L) 9.2     Assessment/Plan:    Acute on chronic kidney disease.    Pre-renal.  Recurrent AKI and ostomy prediposes to pre-renal insults.  Patient may also be addisonian.  Bladder scan does not reveal any evidence of obstruction.  It appears that his baseline creatinine on discharge from [facility] was about 3 mg/dL.  He has had a very complicated course with an episode of acute kidney injury requiring dialysis following his abdominal aortic aneurysm repair.  CT a/p with no obstructive uropathy  Continue foley- indwelling prior to admission per pt  Improving.  Transition from Normal saline to LR at 100 ml/hr x 24 hours   Unsure if routine fluid administration would be possible at his SNF to help prevent AKI episodes.  Limited reserve with  CKD.  Encouraged hydration po.   Anemia CKD in part.  aranesp 40 mcg on 2/25 . Got PRBC's 2/26. Iron ok in 02/02/20.  May need additional blood   Hyponatremia. Felt depleted - resolved.   Hypotension - improving.  On solucortef.  cortisol ok.  blood cultures ngtd.  covid neg  Hyperkalemia - improving and now k a bit low.  Defer repletion.  On NS and bicarb. If stable on repeat can likely liberalize diet a bit.   Diabetes mellitus as per primary service  Hypoaldosteronism - cortisol ok from 04:26 am 2/24 at 23.6.  Note on solucortef here  Metabolic acidosis - continue home dose of sodium bicarb  hypomag - 2 gram IV x 2 doses today   Claudia Desanctis, MD 03/08/2020 11:30 AM

## 2020-03-08 NOTE — Progress Notes (Signed)
Pharmacy Antibiotic Note  Curtis Solis is a 64 y.o. male admitted on 03/04/2020 with wound infection and ischemic gangrene of the toes with osteo.  Pharmacy has been consulted for Vancomycin/Merrem dosing. Acute on chronic kidney disease, not on HD.   Today, patient remains afebrile and WBC wnl. Scr has continued to trend down during this admission from 10 to 3.34 today. Given improved renal function, will increase dose of Merrem accordingly.   Plan: Increase Merrem to 1g q12h -monitor for renal function changes  Cont vancomycin  - Re-check random vancomycin level in 48 hours (ordered for 2/28 0500)  Cont Clindamycin per MD  Height: '5\' 11"'$  (180.3 cm) Weight: 55.3 kg (121 lb 14.6 oz) IBW/kg (Calculated) : 75.3  Temp (24hrs), Avg:97.6 F (36.4 C), Min:97 F (36.1 C), Max:97.8 F (36.6 C)  Recent Labs  Lab 03/04/20 1821 03/04/20 2039 03/05/20 0420 03/05/20 1643 03/06/20 0141 03/07/20 0130 03/08/20 0315  WBC 12.6*  --  11.0*  --  11.1* 11.0* 10.1  CREATININE 10.00*  --  9.29* 8.00* 7.44* 5.27* 3.34*  LATICACIDVEN  --  1.1  --   --   --   --   --   VANCORANDOM  --   --   --   --   --  18  --     Estimated Creatinine Clearance: 17.9 mL/min (A) (by C-G formula based on SCr of 3.34 mg/dL (H)).    No Known Allergies  Claudina Lick, PharmD PGY1 Acute Care Pharmacy Resident  03/08/2020 9:20 AM  Please check [email or web address] for unit-specific pharmacy phone numbers.

## 2020-03-08 NOTE — Progress Notes (Signed)
PROGRESS NOTE  Curtis Solis [id number] DOB: [birth year] DOA: 03/04/2020 PCP: [name], MD   LOS: 4 days   Brief narrative:  65 year old male with past medical history of diabetes mellitus type 2, CKD stage IV, severe protein calorie malnutrition (S/P PEG), hypertension, chronic urinary retention with indwelling Foley catheter presented to to the hospital from [facility] via EMS for abnormal labs including recurrent hyponatremia, hyperkalemia and elevated creatinine.  Patient does have a history of complicated hospitalization in the past after AAA repair.  Patient had acute kidney injury at that time and required tunneled hemodialysis catheter placement and CRRT.  He also had mesenteric ischemia and underwent small bowel resection post ileostomy at this time.  He was intubated and required pericardiocentesis and drain placement on October 2021.   Patient additionally developed necrosis of the foreskin of the penis with urinary retention requiring blunt dissection and placement of a Foley catheter.  Patient eventually recovered, was extubated and was able to be transitioned off of renal replacement therapy in November  with a new baseline creatinine of approximately 3.0 (CKD IV).  Patient was discharged to [facility] 11/25/2019.  With renal function improvement, patient was eventually discharged on 02/25/20 back to [facility] skilled nursing facility.    After discharge to skilled nursing facility, patient continued to have generalized weakness.  A routine blood work done in the skilled nursing facility revealed hyperkalemia, hyponatremia and acute kidney injury.  In the ED, patient was noted to have creatinine of 10.0 BUN of 84, mild hyperkalemia 5.2 with hyponatremia of 126.  Patient was also found to be hypotensive with initial systolic blood pressures in the 80s.  Lactate was within normal limits.  Bladder scan and bilateral renal ultrasound performed the emergency department revealed no  evidence of outlet obstruction.    Due to concern for Addison's disease patient was given hydrocortisone and was admitted hospital for further evaluation and treatment.   Patient was seen by orthopedics Dr. Sharol Given who recommended transmetatarsal amputation.     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    Improving.  Continue Foley catheter.  Nephrology on board.  Daily intake and output charting.  off IV fluids.  Lab Results  Component Value Date   CREATININE 3.34 (H) 03/08/2020   CREATININE 5.27 (H) 03/07/2020   CREATININE 7.44 (H) 03/06/2020    Hypotension.    improved at this time.  On physiological dose of hydrocortisone.  Discontinued.  Patient is on fludrocortisone at home.  Will resume.  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    Patient was recently evaluated by vascular surgery at [facility] on 2/15 and had unremarkable ABI.   x-ray of right foot with lucency involving the fifth metacarpal and proximal phalanx concerning for osteomyelitis.  Extensive soft tissue swelling in the forefoot.    On clindamycin, vancomycin and Merrem.  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  Orthopedics Dr. Sharol Given has seen the patient and recommending  transmetatarsal amputation.  Patient with the second opinion and patient has been seen by Dr. Aubery Lapping also recommend transmetatarsal amputation.  I again spoke with the patient about it.  He wants to discuss with the rest of the family today and let me know his decision by tomorrow  Chronic urinary retention/open wound of penis.  Currently on a Foley catheter.  We will continue.  Mild recurrent hyponatremia.    Continue to monitor.  Hyperkalemia mild.  Has improved.  Potassium of 4.4 today.  Hypomagnesemia.  Significantly low.  Will replenish.  Check levels in a.m.  Chronic abdominal wound greenish discharge with ileostomy.  Wound care on board. On  broad-spectrum antibiotics.  Severe protein calorie malnutrition.    Present on admission.  Continue nutritional supplements.    Anemia of chronic kidney disease.  Patient received 1 unit of packed RBC for low hemoglobin.  Hemoglobin of 7.7 today.  GERD.  Continue PPI.  Diabetes mellitus II with CKD.  Continue Accu-Cheks, diabetic diet sliding scale insulin.  Latest POC glucose of 155  DVT prophylaxis: heparin injection 5,000 Units Start: 03/05/20 0600   Code Status: Full code  Family Communication: None  Status is: Inpatient  Remains inpatient appropriate because:IV treatments appropriate due to intensity of illness or inability to take PO and Inpatient level of care appropriate due to severity of illness, IV antibiotics, need for transmetatarsal amputation   Dispo: The patient is from: Skilled nursing facility              Anticipated d/c is to: Skilled nursing facility  Anticipated discharge date: 3 to 4 days              Patient currently is not medically stable to d/c.   Difficult to place patient No   Consultants:  Orthopedics  Nephrology  Procedures:  Transfusion of packed RBC.  Anti-infectives:  Marland Kitchen Vancomycin, .  Merrem and  . clindamycin 2/24>  Anti-infectives (From admission, onward)   Start      Dose/Rate Route Frequency Ordered Stop   03/08/20 1400  meropenem (MERREM) 1 g in sodium chloride 0.9 % 100 mL IVPB        1 g 200 mL/hr over 30 Minutes Intravenous Every 12 hours 03/08/20 0922     03/07/20 0315  vancomycin (VANCOREADY) IVPB 1000 mg/200 mL        1,000 mg 200 mL/hr over 60 Minutes Intravenous  Once 03/07/20 0223 03/07/20 0449   03/06/20 0000  vancomycin variable dose per unstable renal function (pharmacist dosing)         Does not apply See admin instructions 03/05/20 0926     03/05/20 0300  meropenem (MERREM) 1 g in sodium chloride 0.9 % 100 mL IVPB  Status:  Discontinued        1 g 200 mL/hr over 30 Minutes Intravenous Every 24 hours 03/05/20 0231 03/08/20 0922   03/05/20 0245  vancomycin (VANCOREADY) IVPB 1250 mg/250 mL        1,250 mg 166.7 mL/hr over 90 Minutes Intravenous  Once 03/05/20 0231 03/05/20 0525   03/05/20 0230  clindamycin (CLEOCIN) IVPB 600 mg        600 mg 100 mL/hr over 30 Minutes Intravenous Every 8 hours 03/05/20 0223 03/07/20  2255      Subjective: Today, patient was seen and examined at bedside.  Denies any fever, chills or rigor.  Denies nausea vomiting   Objective: Vitals:   03/08/20 0343 03/08/20 0740  BP: (!) 146/74 (!) 166/80  Pulse: 62 80  Resp: 15 16  Temp: 97.8 F (36.6 C) (!) 97 F (36.1 C)  SpO2: 100% 100%    Intake/Output Summary (Last 24 hours) at 03/08/2020 1056 Last data filed at 03/08/2020 0629 Gross per 24 hour  Intake 8708.38 ml  Output 3975 ml  Net 4733.38 ml   Filed Weights   03/04/20 1809 03/05/20 1639  Weight: 55.8 kg 55.3 kg   Body mass index is 17 kg/m.   Physical Exam:  General: Thinly built, not in obvious distress HENT: Mild pallor noted. Oral mucosa is moist.  Chest:  Clear breath sounds.  Diminished breath sounds bilaterally. No crackles or wheezes.  CVS: S1 &S2 heard. No murmur.  Regular rate and rhythm. Abdomen: Soft, nontender, abdominal wound with dressing.  Bowel sounds are heard.  PEG tube  in place, ileostomy bag in place, chronic Foley catheter Extremities:   Right foot with necrotic toe ulcerations, status post left above-knee amputation. Psych: Alert, awake and oriented, normal mood CNS:  No cranial nerve deficits.  Moves all extremities. Skin: Warm and dry.     Data Review: I have personally reviewed the following laboratory data and studies,  CBC: Recent Labs  Lab 03/04/20 1821 03/05/20 0420 03/06/20 0141 03/07/20 0130 03/08/20 0315  WBC 12.6* 11.0* 11.1* 11.0* 10.1  NEUTROABS  --  8.9*  --   --   --   HGB 7.7* 7.3* 6.8* 7.9* 7.7*  HCT 24.8* 23.4* 22.2* 24.8* 23.8*  MCV 90.8 92.9 91.0 89.5 90.2  PLT 284 233 261 220 [id number]   Basic Metabolic Panel: Recent Labs  Lab 03/05/20 0420 03/05/20 1643 03/06/20 0141 03/07/20 0130 03/08/20 0315  NA 130* 132* 136 133* 139  K 5.6* 5.4* 5.3* 4.4 3.4*  CL 95* 99 101 106 114*  CO2 20* 19* 20* 18* 15*  GLUCOSE 138* 144* 145* 153* 172*  BUN 84* 82* 80* 71* 54*  CREATININE 9.29* 8.00* 7.44* 5.27* 3.34*  CALCIUM 9.4 9.5 9.2 8.7* 7.4*  MG 2.0  --  1.9 1.6* 1.1*  PHOS 8.2*  --  6.6* 5.1*  --    Liver Function Tests: Recent Labs  Lab 03/05/20 0420 03/06/20 0141  ALBUMIN 2.4* 2.1*   No results for input(s): LIPASE, AMYLASE in the last 168 hours. No results for input(s): AMMONIA in the last 168 hours. Cardiac Enzymes: Recent Labs  Lab 03/04/20 1954 03/06/20 0141  CKTOTAL 26* 16*   BNP (last 3 results) No results for input(s): BNP in the last 8760 hours.  ProBNP (last 3 results) No results for input(s): PROBNP in the last 8760 hours.  CBG: Recent Labs  Lab 03/07/20 1753 03/07/20 1959 03/07/20 2334 03/08/20 0431 03/08/20 0743  GLUCAP 149* 150* 151* 173* 165*   Recent Results (from the past 240 hour(s))  Blood culture (routine x 2)     Status: None (Preliminary result)   Collection Time: 03/04/20  7:05 PM   Specimen: BLOOD  Result Value Ref Range Status   Specimen Description BLOOD SITE NOT SPECIFIED   Final   Special Requests   Final    BOTTLES DRAWN AEROBIC AND ANAEROBIC Blood Culture results may not be optimal due to an inadequate volume of blood received in culture bottles  Culture   Final    NO GROWTH 3 DAYS Performed at [facility], [place] [address] [place], [place] [address]    Report Status PENDING  Incomplete  Blood culture (routine x 2)     Status: None (Preliminary result)   Collection Time: 03/04/20  7:10 PM   Specimen: BLOOD  Result Value Ref Range Status   Specimen Description BLOOD BLOOD LEFT FOREARM  Final   Special Requests   Final    BOTTLES DRAWN AEROBIC AND ANAEROBIC Blood Culture results may not be optimal due to an inadequate volume of blood received in culture bottles   Culture   Final    NO GROWTH 3 DAYS Performed at [facility], [place] [address], [place], [place] [address]    Report Status PENDING  Incomplete  SARS CORONAVIRUS 2 (TAT 6-24 HRS) Nasopharyngeal Urine, Catheterized     Status: None   Collection Time: 03/04/20  7:54 PM   Specimen: Urine, Catheterized; Nasopharyngeal  Result Value Ref Range Status   SARS Coronavirus 2 NEGATIVE NEGATIVE Final    Comment: (NOTE) SARS-CoV-2 target nucleic acids are NOT DETECTED.  The SARS-CoV-2 RNA is generally detectable in upper and lower respiratory specimens during the acute phase of infection. Negative results do not preclude SARS-CoV-2 infection, do not rule out co-infections with other pathogens, and should not be used as the sole basis for treatment or other patient management decisions. Negative results must be combined with clinical observations, patient history, and epidemiological information. The expected result is Negative.  Fact Sheet for Patients: SugarRoll.be  Fact Sheet for Healthcare Providers: https://www.woods-mathews.com/  This test is not yet approved or cleared by the [place] FDA and  has been authorized for detection  and/or diagnosis of SARS-CoV-2 by FDA under an Emergency Use Authorization (EUA). This EUA will remain  in effect (meaning this test can be used) for the duration of the COVID-19 declaration under Se ction 564(b)(1) of the Act, 21 U.S.C. section 360bbb-3(b)(1), unless the authorization is terminated or revoked sooner.  Performed at [facility], [place] [address] [place], [place] [address]   Urine culture     Status: None   Collection Time: 03/04/20  9:02 PM   Specimen: Urine, Random  Result Value Ref Range Status   Specimen Description URINE, RANDOM  Final   Special Requests NONE  Final   Culture   Final    NO GROWTH Performed at [facility], [place] [address] [place], [place] [address]    Report Status 03/06/2020 FINAL  Final  Aerobic Culture w Gram Stain (superficial specimen)     Status: None   Collection Time: 03/05/20  3:03 AM   Specimen: Wound  Result Value Ref Range Status   Specimen Description WOUND STOMACH  Final   Special Requests NONE  Final   Gram Stain   Final    RARE WBC PRESENT, PREDOMINANTLY PMN MODERATE GRAM POSITIVE COCCI Performed at [facility], [place] [address], [place], [place] [address]    Culture   Final    ABUNDANT METHICILLIN RESISTANT STAPHYLOCOCCUS AUREUS   Report Status 03/07/2020 FINAL  Final   Organism ID, Bacteria METHICILLIN RESISTANT STAPHYLOCOCCUS AUREUS  Final      Susceptibility   Methicillin resistant staphylococcus aureus - MIC*    CIPROFLOXACIN >=8 RESISTANT Resistant     ERYTHROMYCIN >=8 RESISTANT Resistant     GENTAMICIN <=0.5 SENSITIVE Sensitive     OXACILLIN >=4 RESISTANT Resistant  TETRACYCLINE <=1 SENSITIVE Sensitive     VANCOMYCIN 1 SENSITIVE Sensitive     TRIMETH/SULFA <=10 SENSITIVE Sensitive     CLINDAMYCIN <=0.25 SENSITIVE Sensitive     RIFAMPIN <=0.5 SENSITIVE Sensitive     Inducible Clindamycin NEGATIVE Sensitive     * ABUNDANT METHICILLIN RESISTANT STAPHYLOCOCCUS AUREUS     Studies: No results  found.    Flora Lipps, MD  [facility] 03/08/2020  If 7PM-7AM, please contact night-coverage

## 2020-03-09 DIAGNOSIS — M86171 Other acute osteomyelitis, right ankle and foot: Secondary | ICD-10-CM

## 2020-03-09 DIAGNOSIS — E1122 Type 2 diabetes mellitus with diabetic chronic kidney disease: Secondary | ICD-10-CM | POA: Diagnosis not present

## 2020-03-09 DIAGNOSIS — Z932 Ileostomy status: Secondary | ICD-10-CM

## 2020-03-09 DIAGNOSIS — N185 Chronic kidney disease, stage 5: Secondary | ICD-10-CM

## 2020-03-09 DIAGNOSIS — L97513 Non-pressure chronic ulcer of other part of right foot with necrosis of muscle: Secondary | ICD-10-CM

## 2020-03-09 DIAGNOSIS — I96 Gangrene, not elsewhere classified: Secondary | ICD-10-CM | POA: Diagnosis not present

## 2020-03-09 DIAGNOSIS — N170 Acute kidney failure with tubular necrosis: Principal | ICD-10-CM

## 2020-03-09 DIAGNOSIS — E871 Hypo-osmolality and hyponatremia: Secondary | ICD-10-CM

## 2020-03-09 DIAGNOSIS — E875 Hyperkalemia: Secondary | ICD-10-CM

## 2020-03-09 LAB — COMPREHENSIVE METABOLIC PANEL
ALT: 18 U/L (ref 0–44)
AST: 15 U/L (ref 15–41)
Albumin: 2 g/dL — ABNORMAL LOW (ref 3.5–5.0)
Alkaline Phosphatase: 53 U/L (ref 38–126)
Anion gap: 8 (ref 5–15)
BUN: 50 mg/dL — ABNORMAL HIGH (ref 8–23)
CO2: 19 mmol/L — ABNORMAL LOW (ref 22–32)
Calcium: 8.2 mg/dL — ABNORMAL LOW (ref 8.9–10.3)
Chloride: 109 mmol/L (ref 98–111)
Creatinine, Ser: 2.59 mg/dL — ABNORMAL HIGH (ref 0.61–1.24)
GFR, Estimated: 27 mL/min — ABNORMAL LOW (ref >60)
Glucose, Bld: 129 mg/dL — ABNORMAL HIGH (ref 70–99)
Potassium: 3 mmol/L — ABNORMAL LOW (ref 3.5–5.1)
Sodium: 136 mmol/L (ref 135–145)
Total Bilirubin: 0.7 mg/dL (ref 0.3–1.2)
Total Protein: 5.6 g/dL — ABNORMAL LOW (ref 6.5–8.1)

## 2020-03-09 LAB — CULTURE, BLOOD (ROUTINE X 2)
Culture: NO GROWTH
Culture: NO GROWTH

## 2020-03-09 LAB — CBC
HCT: 26.2 % — ABNORMAL LOW (ref 39.0–52.0)
Hemoglobin: 8.3 g/dL — ABNORMAL LOW (ref 13.0–17.0)
MCH: 28.1 pg (ref 26.0–34.0)
MCHC: 31.7 g/dL (ref 30.0–36.0)
MCV: 88.8 fL (ref 80.0–100.0)
Platelets: 273 10*3/uL (ref 150–400)
RBC: 2.95 MIL/uL — ABNORMAL LOW (ref 4.22–5.81)
RDW: 16.8 % — ABNORMAL HIGH (ref 11.5–15.5)
WBC: 12 10*3/uL — ABNORMAL HIGH (ref 4.0–10.5)
nRBC: 0 % (ref 0.0–0.2)

## 2020-03-09 LAB — PHOSPHORUS: Phosphorus: 1.8 mg/dL — ABNORMAL LOW (ref 2.5–4.6)

## 2020-03-09 LAB — VANCOMYCIN, RANDOM: Vancomycin Rm: 19

## 2020-03-09 LAB — MAGNESIUM: Magnesium: 2 mg/dL (ref 1.7–2.4)

## 2020-03-09 MED ORDER — VANCOMYCIN HCL 1000 MG/200ML IV SOLN
1000.0000 mg | Freq: Once | INTRAVENOUS | Status: DC
  Filled 2020-03-09: qty 200

## 2020-03-09 MED ORDER — K PHOS MONO-SOD PHOS DI & MONO 155-852-130 MG PO TABS
500.0000 mg | ORAL_TABLET | Freq: Three times a day (TID) | ORAL | Status: AC
  Administered 2020-03-09 (×3): 500 mg via ORAL
  Filled 2020-03-09 (×4): qty 2

## 2020-03-09 MED ORDER — POTASSIUM CHLORIDE CRYS ER 20 MEQ PO TBCR
40.0000 meq | EXTENDED_RELEASE_TABLET | Freq: Two times a day (BID) | ORAL | Status: AC
  Administered 2020-03-09 (×2): 40 meq via ORAL
  Filled 2020-03-09 (×2): qty 2

## 2020-03-09 NOTE — Progress Notes (Addendum)
Pharmacy Antibiotic Note  Curtis Solis is a 65 y.o. male admitted on 03/04/2020 with wound infection and ischemic gangrene of the toes with osteo.  Pharmacy has been consulted for Vancomycin/Merrem dosing. Acute on chronic kidney disease, not on HD.   Patient remains afebrile and WBC wnl. Scr has continued to trend down during this admission from 10 to 2.59 today. Vanc random came back at 19 today. Prob will need around q48 hr dosing but we will dose PRN for now and recheck another level in 2 days.  Addendum:  ID suggested to dc abx for now. Confirmed with Dr. Tommy Medal.  Plan: Dc vanc/merrem   Height: '5\' 11"'$  (180.3 cm) Weight: 53.7 kg (118 lb 6.2 oz) IBW/kg (Calculated) : 75.3  Temp (24hrs), Avg:98.2 F (36.8 C), Min:97.6 F (36.4 C), Max:98.5 F (36.9 C)  Recent Labs  Lab 03/04/20 2039 03/05/20 0420 03/05/20 1643 03/06/20 0141 03/07/20 0130 03/08/20 0315 03/09/20 0057  WBC  --  11.0*  --  11.1* 11.0* 10.1 12.0*  CREATININE  --  9.29* 8.00* 7.44* 5.27* 3.34* 2.59*  LATICACIDVEN 1.1  --   --   --   --   --   --   VANCORANDOM  --   --   --   --  18  --  19    Estimated Creatinine Clearance: 22.5 mL/min (A) (by C-G formula based on SCr of 2.59 mg/dL (H)).    No Known Allergies  Onnie Boer, PharmD, BCIDP, AAHIVP, CPP Infectious Disease Pharmacist 03/09/2020 10:13 AM

## 2020-03-09 NOTE — Progress Notes (Signed)
Patient ID: Curtis Solis, male   DOB: 03-16-1955, 65 y.o.   MRN: [id number] S: No complaints. O:BP 132/74 (BP Location: Right Arm)   Pulse 86   Temp 98.1 F (36.7 C) (Axillary)   Resp 17   Ht '5\' 11"'$  (1.803 m)   Wt 53.7 kg   SpO2 100%   BMI 16.51 kg/m   Intake/Output Summary (Last 24 hours) at 03/09/2020 1221 Last data filed at 03/09/2020 0736 Gross per 24 hour  Intake 3254.38 ml  Output 4625 ml  Net -1370.62 ml   Intake/Output: I/O last 3 completed shifts: In: 12632.8 [P.O.:1620; I.V.:8445.8; Other:300; FU:[id number]; IV Piggyback:400] Out: [id number] [Urine:4225; Drains:500; [id number]  Intake/Output this shift:  Total I/O In: 100 [IV Piggyback:100] Out: -  Weight change:  Gen: NAD CVS: RRR Resp: cta Abd: +BS, soft, NT, ostomy in RLQ Ext: gangrenous changes to RLE.   Recent Labs  Lab 03/04/20 1821 03/05/20 0420 03/05/20 1643 03/06/20 0141 03/07/20 0130 03/08/20 0315 03/09/20 0057  NA 126* 130* 132* 136 133* 139 136  K 5.2* 5.6* 5.4* 5.3* 4.4 3.4* 3.0*  CL 89* 95* 99 101 106 114* 109  CO2 22 20* 19* 20* 18* 15* 19*  GLUCOSE 106* 138* 144* 145* 153* 172* 129*  BUN 84* 84* 82* 80* 71* 54* 50*  CREATININE 10.00* 9.29* 8.00* 7.44* 5.27* 3.34* 2.59*  ALBUMIN  --  2.4*  --  2.1*  --   --  2.0*  CALCIUM 9.7 9.4 9.5 9.2 8.7* 7.4* 8.2*  PHOS  --  8.2*  --  6.6* 5.1*  --  1.8*  AST  --   --   --   --   --   --  15  ALT  --   --   --   --   --   --  18   Liver Function Tests: Recent Labs  Lab 03/05/20 0420 03/06/20 0141 03/09/20 0057  AST  --   --  15  ALT  --   --  18  ALKPHOS  --   --  53  BILITOT  --   --  0.7  PROT  --   --  5.6*  ALBUMIN 2.4* 2.1* 2.0*   No results for input(s): LIPASE, AMYLASE in the last 168 hours. No results for input(s): AMMONIA in the last 168 hours. CBC: Recent Labs  Lab 03/05/20 0420 03/06/20 0141 03/07/20 0130 03/08/20 0315 03/09/20 0057  WBC 11.0* 11.1* 11.0* 10.1 12.0*  NEUTROABS 8.9*  --   --   --   --   HGB 7.3* 6.8*  7.9* 7.7* 8.3*  HCT 23.4* 22.2* 24.8* 23.8* 26.2*  MCV 92.9 91.0 89.5 90.2 88.8  PLT 233 261 220 230 273   Cardiac Enzymes: Recent Labs  Lab 03/04/20 1954 03/06/20 0141  CKTOTAL 26* 16*   CBG: Recent Labs  Lab 03/08/20 0743 03/08/20 1217 03/08/20 1723 03/08/20 2016 03/08/20 2250  GLUCAP 165* 154* 133* 134* 137*    Iron Studies: No results for input(s): IRON, TIBC, TRANSFERRIN, FERRITIN in the last 72 hours. Studies/Results: No results found. Marland Kitchen atorvastatin  40 mg Oral Daily  . Chlorhexidine Gluconate Cloth  6 each Topical Daily  . darbepoetin (ARANESP) injection - NON-DIALYSIS  40 mcg Subcutaneous Q Fri-1800  . feeding supplement (NEPRO CARB STEADY)  237 mL Oral BID BM  . feeding supplement (PROSource TF)  45 mL Per Tube BID  . fludrocortisone  0.1 mg Oral Daily  . gabapentin  200 mg  Oral QHS  . heparin  5,000 Units Subcutaneous Q8H  . insulin aspart  0-9 Units Subcutaneous TID AC & HS  . liver oil-zinc oxide   Topical BID  . melatonin  5 mg Oral QHS  . multivitamin  1 tablet Oral QHS  . pantoprazole  20 mg Oral Daily  . phosphorus  500 mg Oral TID  . potassium chloride  40 mEq Oral BID  . sodium bicarbonate  1,300 mg Oral BID  . vancomycin variable dose per unstable renal function (pharmacist dosing)   Does not apply See admin instructions    BMET    Component Value Date/Time   NA 136 03/09/2020 0057   K 3.0 (L) 03/09/2020 0057   CL 109 03/09/2020 0057   CO2 19 (L) 03/09/2020 0057   GLUCOSE 129 (H) 03/09/2020 0057   BUN 50 (H) 03/09/2020 0057   CREATININE 2.59 (H) 03/09/2020 0057   CALCIUM 8.2 (L) 03/09/2020 0057   GFRNONAA 27 (L) 03/09/2020 0057   CBC    Component Value Date/Time   WBC 12.0 (H) 03/09/2020 0057   RBC 2.95 (L) 03/09/2020 0057   HGB 8.3 (L) 03/09/2020 0057   HCT 26.2 (L) 03/09/2020 0057   PLT 273 03/09/2020 0057   MCV 88.8 03/09/2020 0057   MCH 28.1 03/09/2020 0057   MCHC 31.7 03/09/2020 0057   RDW 16.8 (H) 03/09/2020 0057    LYMPHSABS 1.2 03/05/2020 0420   MONOABS 0.6 03/05/2020 0420   EOSABS 0.1 03/05/2020 0420   BASOSABS 0.0 03/05/2020 0420     Assessment/Plan:  1. AKI/CKD stage IV - presumably due to ischemic ATN in setting of volume depletion (from high ostomy output) and hypotension.  Scr has steadily improved with IVF's.  Now at baseline Scr.  Will need to maintain adequate hydration as an outpatient and recommend Gi evaluation to see if they can decrease his ostomy output to prevent AKI from recurring.  Nothing more to add and will sign off.  He can follow up with his Nephrologist at [facility] 2. Hypotension - improved with IVF's.  3. Gangrene of right foot with osteo - will require amputation, however pt is reluctant to proceed without second opinion. 4. Chronic urinary retention - per primary 5. Anemia of CKD - on ESA 6. DM type 2 - per primary 7. Disposition - pending return to SNF.   Donetta Potts, MD [employer] [phone]

## 2020-03-09 NOTE — Consult Note (Signed)
Date of Admission:  03/04/2020          Reason for Consult: Necrotic toes with osteomyelitis in need of amputation    Referring Provider: Dr. Grier Rocher   Assessment:  1. 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 Stage IV kidney disease with brief periods of need for hemodialysis, recent acute on chronic renal failure 3. Multiple metabolic derangements including hyperkalemia hyponatremia 4. 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 History of cardiac arrest in [place] to cardiac tamponade status post cardiocentesis in October 2021 6. 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 History of diverting colostomy 8. Peripheral vascular disease  Plan:  1. Patient says he will still consider surgery.  I have emphasized to him that he is not going to have his necrotic tissue healed by antibiotics alone and that he needs an amputation to affect cure and that absent such a surgery his life will be in danger. I assured him that any surgeon he sees at [facility] will give the same opionion given by TWO different surgeons from different surgical groups in our health care system 2. HOld off antibiotics, if he did insist on being on them , I would favor doing oral antibiotics rather than IV antibiotics.  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 atorvastatin  40 mg Oral Daily  . Chlorhexidine Gluconate Cloth  6 each Topical Daily  . darbepoetin (ARANESP) injection - NON-DIALYSIS  40 mcg Subcutaneous Q Fri-1800  . feeding supplement (NEPRO CARB STEADY)  237 mL Oral BID BM  . feeding supplement (PROSource TF)  45 mL Per Tube BID  . fludrocortisone  0.1 mg Oral Daily  . gabapentin  200 mg Oral QHS  . heparin  5,000 Units Subcutaneous Q8H  . insulin aspart  0-9 Units Subcutaneous TID AC & HS  . liver oil-zinc oxide   Topical BID  . melatonin  5 mg Oral QHS  . multivitamin  1 tablet Oral QHS  . pantoprazole  20 mg Oral Daily  . phosphorus  500 mg Oral TID  . potassium chloride  40 mEq Oral BID  . sodium bicarbonate  1,300 mg Oral BID  . vancomycin variable dose per unstable renal function (pharmacist dosing)   Does not apply See admin instructions   Continuous Infusions: . feeding supplement (OSMOLITE 1.5 CAL) 1,000 mL (03/08/20 2353)  . meropenem (MERREM) IV 1 g (03/09/20 1118)  . vancomycin  PRN Meds:.acetaminophen **OR** acetaminophen, calcium carbonate, fentaNYL (SUBLIMAZE) injection, hydrOXYzine, ipratropium-albuterol, ondansetron **OR** ondansetron (ZOFRAN) IV  HPI: Curtis Solis is a 65 y.o. male with complicated past medical history including type 2 diabetes mellitus stage IV kidney disease protein calorie malnutrition, history of bowel resection diverting colostomy now status post PEG hypertension problems of urinary retention who had a very difficult fall where he was hospitalized at [facility] for AAA repair.  This was complicated by embolization of the aortic thrombus  leading to need for above-the-knee amputation.  He also suffered severe acute kidney injury requiring hemodialysis and CRRT.  He also suffered a cardiac arrest due to cardiac tamponade status post pericardiocentesis.  He had problems urinary retention and necrotic skin of his foreskin that required interventions.  He was admitted again for hyperkalemia need for dialysis [facility].  There he was found to have extensive necrotic areas involving his right foot.  Initially conservative care was pursued.  Renal function improved he was discharged back to his skilled nursing facility.  Since then continue experience weakness.  Then was found to have hyperkalemia and acute kidney injury with hyponatremia also was found to be hypotensive.  On admitted to the hospital at [facility].  Has been evaluated by orthopedic surgery for his wet gangrene involving all of the digits of his right foot.  He has had MRI that shows osteomyelitis in digits 2 through 5 on imaging.   He has been seen by Dr. Meridee Score who felt patient would need an trans-metatarsal amputation.   Was not yet ready for this and want to think about it further.  In the interim a second orthopedic surgeon Dr. Stann Mainland is seen the  Stann Mainland concurs with Dr. Sharol Given that the patient should proceed with a transmetatarsal amputation of the right foot to help with control of this area which is not going to be curable through antibiotics or vascular intervention.  I have told the patient that I agree with both of the orthopedic surgeons that have already been consulted and that I see no way in which antibiotics are going to help effective cure in his right foot.  Furthermore I have told the patient that failing to control the infection of the site could potentially lead to him losing his life.  He is already quite vulnerable and has gone through multiple dramatic events this fall and I would hate for his foot infection  to mean the end of his life.  I told him that was certain that if he got a third opinion at [facility] but the surgeons there would give him the same opinion as that given by her local surgeons.  I do not see an indication for IV antibiotics at this point in time.  Certainly if he is not going to have aggressive surgical amputation to me it would seem to be futile to place a central line in a patient who may need access for dialysis if we are not getting to a cure of the infection that he has.  He told me he will consider to think about surgery.  In the interim we will hold off on giving him any antibiotics.  Review of Systems: Review of Systems  Constitutional: Negative for chills, diaphoresis, fever, malaise/fatigue and weight loss.  HENT: Negative for congestion, hearing loss, sore throat and tinnitus.   Eyes: Negative for blurred vision and double vision.  Respiratory: Negative for cough, sputum production, shortness of breath and wheezing.  Cardiovascular: Negative for chest pain, palpitations and leg swelling.  Gastrointestinal: Negative for abdominal pain, blood in stool, constipation, diarrhea, heartburn, melena, nausea and vomiting.  Genitourinary: Negative for dysuria, flank pain and hematuria.  Musculoskeletal: Positive for joint pain and myalgias. Negative for back pain and falls.  Skin: Negative for itching and rash.  Neurological: Negative for dizziness, sensory change, focal weakness, loss of consciousness, weakness and headaches.  Endo/Heme/Allergies: Does not bruise/bleed easily.  Psychiatric/Behavioral: Negative for depression, memory loss and suicidal ideas. The patient is nervous/anxious.     Past Medical History:  Diagnosis Date  . A-fib [facility])    During hospital stay  . CKD (chronic kidney disease)   . Hyperlipidemia   . Hyperthyroidism   . PVD (peripheral vascular disease) ([place])     Social History   Tobacco Use  . Smoking status: Former Smoker     Quit date: 09/26/2019    Years since quitting: 0.4  . Smokeless tobacco: Never Used  Substance Use Topics  . Alcohol use: Yes    Family History  Problem Relation Age of Onset  . Heart disease Other    No Known Allergies  OBJECTIVE: Blood pressure (!) 144/81, pulse 86, temperature 97.7 F (36.5 C), temperature source Axillary, resp. rate 16, height '5\' 11"'$  (1.803 m), weight 53.7 kg, SpO2 100 %.  Physical Exam Constitutional:      General: He is not in acute distress.    Appearance: Normal appearance. He is well-developed and well-nourished. He is not ill-appearing or diaphoretic.  HENT:     Head: Normocephalic and atraumatic.     Right Ear: Hearing and external ear normal.     Left Ear: Hearing and external ear normal.     Nose: No nasal deformity, rhinorrhea or epistaxis.  Eyes:     General: No scleral icterus.    Extraocular Movements: EOM normal.     Conjunctiva/sclera: Conjunctivae normal.     Right eye: Right conjunctiva is not injected.     Left eye: Left conjunctiva is not injected.     Pupils: Pupils are equal, round, and reactive to light.  Neck:     Vascular: No JVD.  Cardiovascular:     Rate and Rhythm: Normal rate and regular rhythm.     Heart sounds: Normal heart sounds, S1 normal and S2 normal. No murmur heard. No friction rub.  Abdominal:     General: Bowel sounds are normal. There is no distension or ascites.     Palpations: Abdomen is soft. There is no hepatosplenomegaly.     Tenderness: There is no abdominal tenderness.  Musculoskeletal:        General: Normal range of motion.     Right shoulder: Normal.     Left shoulder: Normal.     Cervical back: Normal range of motion and neck supple.     Right hip: Normal.     Left hip: Normal.  Lymphadenopathy:     Head:     Right side of head: No submandibular, preauricular or posterior auricular adenopathy.     Left side of head: No submandibular, preauricular or posterior auricular adenopathy.      Cervical: No cervical adenopathy.     Right cervical: No superficial or deep cervical adenopathy.    Left cervical: No superficial or deep cervical adenopathy.  Skin:    General: Skin is intact.     Coloration: Skin is not pale.     Findings: No abrasion, bruising, ecchymosis, erythema, lesion or  rash.     Nails: There is no clubbing or cyanosis.  Neurological:     Mental Status: He is alert and oriented to person, place, and time.     Sensory: No sensory deficit.     Coordination: Coordination normal.     Gait: Gait normal.     Deep Tendon Reflexes: Strength normal.  Psychiatric:        Attention and Perception: Attention normal. He is attentive.        Mood and Affect: Mood is anxious.        Speech: Speech is delayed.        Behavior: Behavior is cooperative.        Thought Content: Thought content normal.        Cognition and Memory: Memory is impaired.   To me with leak that nursing staff were trying to attend to.    Right foot with extensive necrotic changes       lab Results Lab Results  Component Value Date   WBC 12.0 (H) 03/09/2020   HGB 8.3 (L) 03/09/2020   HCT 26.2 (L) 03/09/2020   MCV 88.8 03/09/2020   PLT 273 03/09/2020    Lab Results  Component Value Date   CREATININE 2.59 (H) 03/09/2020   BUN 50 (H) 03/09/2020   NA 136 03/09/2020   K 3.0 (L) 03/09/2020   CL 109 03/09/2020   CO2 19 (L) 03/09/2020    Lab Results  Component Value Date   ALT 18 03/09/2020   AST 15 03/09/2020   ALKPHOS 53 03/09/2020   BILITOT 0.7 03/09/2020     Microbiology: Recent Results (from the past 240 hour(s))  Blood culture (routine x 2)     Status: None (Preliminary result)   Collection Time: 03/04/20  7:05 PM   Specimen: BLOOD  Result Value Ref Range Status   Specimen Description BLOOD SITE NOT SPECIFIED  Final   Special Requests   Final    BOTTLES DRAWN AEROBIC AND ANAEROBIC Blood Culture results may not be optimal due to an inadequate volume of blood received in  culture bottles   Culture   Final    NO GROWTH 4 DAYS Performed at [facility], [place] [address], [place], [place] [address]    Report Status PENDING  Incomplete  Blood culture (routine x 2)     Status: None (Preliminary result)   Collection Time: 03/04/20  7:10 PM   Specimen: BLOOD  Result Value Ref Range Status   Specimen Description BLOOD BLOOD LEFT FOREARM  Final   Special Requests   Final    BOTTLES DRAWN AEROBIC AND ANAEROBIC Blood Culture results may not be optimal due to an inadequate volume of blood received in culture bottles   Culture   Final    NO GROWTH 4 DAYS Performed at [facility], [place] [address] [place], [place] [address]    Report Status PENDING  Incomplete  SARS CORONAVIRUS 2 (TAT 6-24 HRS) Nasopharyngeal Urine, Catheterized     Status: None   Collection Time: 03/04/20  7:54 PM   Specimen: Urine, Catheterized; Nasopharyngeal  Result Value Ref Range Status   SARS Coronavirus 2 NEGATIVE NEGATIVE Final    Comment: (NOTE) SARS-CoV-2 target nucleic acids are NOT DETECTED.  The SARS-CoV-2 RNA is generally detectable in upper and lower respiratory specimens during the acute phase of infection. Negative results do not preclude SARS-CoV-2 infection, do not rule out co-infections with other pathogens, and should not be  used as the sole basis for treatment or other patient management decisions. Negative results must be combined with clinical observations, patient history, and epidemiological information. The expected result is Negative.  Fact Sheet for Patients: SugarRoll.be  Fact Sheet for Healthcare Providers: https://www.woods-mathews.com/  This test is not yet approved or cleared by the [place] FDA and  has been authorized for detection and/or diagnosis of SARS-CoV-2 by FDA under an Emergency Use Authorization (EUA). This EUA will remain  in effect (meaning this test can be used) for the duration of  the COVID-19 declaration under Se ction 564(b)(1) of the Act, 21 U.S.C. section 360bbb-3(b)(1), unless the authorization is terminated or revoked sooner.  Performed at [facility], [place] [address] [place], [place] [address]   Urine culture     Status: None   Collection Time: 03/04/20  9:02 PM   Specimen: Urine, Random  Result Value Ref Range Status   Specimen Description URINE, RANDOM  Final   Special Requests NONE  Final   Culture   Final    NO GROWTH Performed at [facility], [place] [address] [place], [place] [address]    Report Status 03/06/2020 FINAL  Final  Aerobic Culture w Gram Stain (superficial specimen)     Status: None   Collection Time: 03/05/20  3:03 AM   Specimen: Wound  Result Value Ref Range Status   Specimen Description WOUND STOMACH  Final   Special Requests NONE  Final   Gram Stain   Final    RARE WBC PRESENT, PREDOMINANTLY PMN MODERATE GRAM POSITIVE COCCI Performed at [facility], [place] [address], [place], [place] [address]    Culture   Final    ABUNDANT METHICILLIN RESISTANT STAPHYLOCOCCUS AUREUS   Report Status 03/07/2020 FINAL  Final   Organism ID, Bacteria METHICILLIN RESISTANT STAPHYLOCOCCUS AUREUS  Final      Susceptibility   Methicillin resistant staphylococcus aureus - MIC*    CIPROFLOXACIN >=8 RESISTANT Resistant     ERYTHROMYCIN >=8 RESISTANT Resistant     GENTAMICIN <=0.5 SENSITIVE Sensitive     OXACILLIN >=4 RESISTANT Resistant     TETRACYCLINE <=1 SENSITIVE Sensitive     VANCOMYCIN 1 SENSITIVE Sensitive     TRIMETH/SULFA <=10 SENSITIVE Sensitive     CLINDAMYCIN <=0.25 SENSITIVE Sensitive     RIFAMPIN <=0.5 SENSITIVE Sensitive     Inducible Clindamycin NEGATIVE Sensitive     * ABUNDANT METHICILLIN RESISTANT STAPHYLOCOCCUS [place] [place] [facility] [phone] pager  03/09/2020, 12:46 PM

## 2020-03-09 NOTE — Progress Notes (Signed)
PROGRESS NOTE  Curtis Solis [id number] DOB: 21-Apr-1955 DOA: 03/04/2020 PCP: [name], MD   LOS: 5 days   Brief narrative:  65 year old male with past medical history of diabetes mellitus type 2, CKD stage IV, severe protein calorie malnutrition (S/P PEG), hypertension, chronic urinary retention with indwelling Foley catheter presented to to the hospital from [facility] via EMS for abnormal labs including recurrent hyponatremia, hyperkalemia and elevated creatinine.  Patient does have a history of complicated hospitalization in the past after AAA repair.  Patient had acute kidney injury at that time and required tunneled hemodialysis catheter placement and CRRT.  He also had mesenteric ischemia and underwent small bowel resection post ileostomy at this time.  He was intubated and required pericardiocentesis and drain placement on October 2021.   Patient additionally developed necrosis of the foreskin of the penis with urinary retention requiring blunt dissection and placement of a Foley catheter.  Patient eventually recovered, was extubated and was able to be transitioned off of renal replacement therapy in November  with a new baseline creatinine of approximately 3.0 (CKD IV).  Patient was discharged to [facility] 11/25/2019.  With renal function improvement, patient was eventually discharged on 02/25/20 back to [facility] skilled nursing facility.    After discharge to skilled nursing facility, patient continued to have generalized weakness.  A routine blood work done in the skilled nursing facility revealed hyperkalemia, hyponatremia and acute kidney injury.  In the ED, patient was noted to have creatinine of 10.0 BUN of 84, mild hyperkalemia 5.2 with hyponatremia of 126.  Patient was also found to be hypotensive with initial systolic blood pressures in the 80s.  Lactate was within normal limits.  Bladder scan and bilateral renal ultrasound performed the emergency department revealed no  evidence of outlet obstruction.    Due to concern for Addison's disease patient was given hydrocortisone and was admitted hospital for further evaluation and treatment.   Patient was seen by orthopedics Dr. Sharol Given who recommended transmetatarsal amputation.  Patient has however opted for IV antibiotic alone and is not prepared for amputation.  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    Improving.  Continue Foley catheter.  Nephrology on board.  Daily intake and output charting.  off IV fluids.  Lab Results  Component Value Date   CREATININE 2.59 (H) 03/09/2020   CREATININE 3.34 (H) 03/08/2020   CREATININE 5.27 (H) 03/07/2020    Hypotension.    improved at this time.  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    Patient was recently evaluated by vascular surgery at [facility] on 2/15 and had unremarkable ABI.   x-ray of right foot with lucency involving the fifth  metacarpal and proximal phalanx concerning for osteomyelitis.  Extensive soft tissue swelling in the forefoot.    On clindamycin, vancomycin and Merrem.  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  Orthopedics Dr. Sharol Given  has seen the patient and recommending transmetatarsal amputation.  Patient wished for second opinion and patient has been seen by Dr. Stann Mainland, orthopedics who also recommend transmetatarsal amputation.    I had a prolonged discussion with the patient today.  He is not prepared for amputation at this time.  He would like to take a third opinion at [facility] with podiatry which he has already arranged.  At this time, he wishes to continue antibiotic and have further opinion at [facility].  Spoke with Dr Tommy Medal infectious disease to assist with antibiotic transition. If patient needs prolonged IV antibiotic, patient might need a tunneled CV catheter.  We will need to address this with nephrology as well.  Chronic urinary retention/open wound of penis.  Currently on a Foley catheter.  We will continue.  Mild recurrent hyponatremia.    Continue to monitor.  Hyperkalemia currently hypokalemia.  On potassium supplements.  Check levels in a.m.  Hypophosphatemia.  Will replenish with Neutra-Phos.  Check levels in a.m.  Hypomagnesemia.  IV magnesium sulfate.  Magnesium has improved with  Chronic abdominal wound greenish discharge with ileostomy.  Wound care on board. On  broad-spectrum antibiotics.  Severe protein calorie malnutrition.    Present on admission.  Continue nutritional supplements.    Anemia of chronic kidney disease IV.  Patient received 1 unit of packed RBC for low hemoglobin.  Hemoglobin of 8.3 today.  GERD.  Continue PPI.  Diabetes mellitus II with CKD.  Continue Accu-Cheks, diabetic diet sliding scale insulin.   DVT prophylaxis: heparin injection 5,000 Units Start: 03/05/20 0600   Code Status: Full code  Family Communication:  Tried to reach the patient's son Mr. Linton Rump on the phone but was unable to reach him.  Status is: Inpatient  Remains inpatient appropriate because:IV treatments appropriate due to intensity of illness or inability to take PO and  Inpatient level of care appropriate due to severity of illness, IV antibiotics, need for transmetatarsal amputation   Dispo: The patient is from: Skilled nursing facility              Anticipated d/c is to: Skilled nursing facility  Anticipated discharge date: 1 to 2 days.  Patient wishes IV antibiotic.  We will get  ID involved.              Patient currently is not medically stable to d/c.   Difficult to place patient No   Consultants:  Orthopedics  Nephrology  Infectious disease.  Procedures:  Transfusion of packed RBC.  Anti-infectives:  Marland Kitchen Vancomycin, .  Merrem and  . clindamycin 2/24>  Anti-infectives (From admission, onward)   Start     Dose/Rate Route Frequency Ordered Stop   03/08/20 1400  meropenem (MERREM) 1 g in sodium chloride 0.9 % 100 mL IVPB        1 g 200 mL/hr over 30 Minutes Intravenous Every 12 hours 03/08/20 0922     03/07/20 0315  vancomycin (VANCOREADY) IVPB 1000 mg/200 mL        1,000 mg 200 mL/hr over 60 Minutes Intravenous  Once 03/07/20 0223 03/07/20 0449   03/06/20 0000  vancomycin variable dose per unstable renal function (pharmacist dosing)  Does not apply See admin instructions 03/05/20 0926     03/05/20 0300  meropenem (MERREM) 1 g in sodium chloride 0.9 % 100 mL IVPB  Status:  Discontinued        1 g 200 mL/hr over 30 Minutes Intravenous Every 24 hours 03/05/20 0231 03/08/20 0922   03/05/20 0245  vancomycin (VANCOREADY) IVPB 1250 mg/250 mL        1,250 mg 166.7 mL/hr over 90 Minutes Intravenous  Once 03/05/20 0231 03/05/20 0525   03/05/20 0230  clindamycin (CLEOCIN) IVPB 600 mg        600 mg 100 mL/hr over 30 Minutes Intravenous Every 8 hours 03/05/20 0223 03/07/20 2255      Subjective: Today, patient was seen and examined at bedside.  Denies any nausea vomiting fever chills or rigor.  Objective: Vitals:   03/08/20 2340 03/09/20 0400  BP: 134/82 (!) 147/74  Pulse: 80 74  Resp: 18 18  Temp: 98.3 F (36.8 C) 98.4 F  (36.9 C)  SpO2: 100% 97%    Intake/Output Summary (Last 24 hours) at 03/09/2020 0744 Last data filed at 03/09/2020 0736 Gross per 24 hour  Intake 4024.38 ml  Output 5125 ml  Net -1100.62 ml   Filed Weights   03/04/20 1809 03/05/20 1639 03/09/20 0400  Weight: 55.8 kg 55.3 kg 53.7 kg   Body mass index is 16.51 kg/m.   Physical Exam:  General: Thinly built, not in obvious distress HENT: Mild pallor noted oral mucosa is moist.  Chest:  Clear breath sounds.  Diminished breath sounds bilaterally. No crackles or wheezes.  CVS: S1 &S2 heard. No murmur.  Regular rate and rhythm. Abdomen: Soft, nontender, nondistended.  Ileostomy, PEG tube in place.  Chronic Foley catheter bowel sounds are heard.   Extremities: Right foot with toes necrotic ulcerations, left above-knee amputation  Psych: Alert, awake and oriented, normal mood CNS:  No cranial nerve deficits.  Power equal in all extremities.   Skin: Warm and dry.  Right necrotic toes.  Data Review: I have personally reviewed the following laboratory data and studies,  CBC: Recent Labs  Lab 03/05/20 0420 03/06/20 0141 03/07/20 0130 03/08/20 0315 03/09/20 0057  WBC 11.0* 11.1* 11.0* 10.1 12.0*  NEUTROABS 8.9*  --   --   --   --   HGB 7.3* 6.8* 7.9* 7.7* 8.3*  HCT 23.4* 22.2* 24.8* 23.8* 26.2*  MCV 92.9 91.0 89.5 90.2 88.8  PLT 233 261 220 230 [id number]   Basic Metabolic Panel: Recent Labs  Lab 03/05/20 0420 03/05/20 1643 03/06/20 0141 03/07/20 0130 03/08/20 0315 03/09/20 0057  NA 130* 132* 136 133* 139 136  K 5.6* 5.4* 5.3* 4.4 3.4* 3.0*  CL 95* 99 101 106 114* 109  CO2 20* 19* 20* 18* 15* 19*  GLUCOSE 138* 144* 145* 153* 172* 129*  BUN 84* 82* 80* 71* 54* 50*  CREATININE 9.29* 8.00* 7.44* 5.27* 3.34* 2.59*  CALCIUM 9.4 9.5 9.2 8.7* 7.4* 8.2*  MG 2.0  --  1.9 1.6* 1.1* 2.0  PHOS 8.2*  --  6.6* 5.1*  --  1.8*   Liver Function Tests: Recent Labs  Lab 03/05/20 0420 03/06/20 0141 03/09/20 0057  AST  --   --  15  ALT   --   --  18  ALKPHOS  --   --  53  BILITOT  --   --  0.7  PROT  --   --  5.6*  ALBUMIN 2.4* 2.1* 2.0*   No  results for input(s): LIPASE, AMYLASE in the last 168 hours. No results for input(s): AMMONIA in the last 168 hours. Cardiac Enzymes: Recent Labs  Lab 03/04/20 1954 03/06/20 0141  CKTOTAL 26* 16*   BNP (last 3 results) No results for input(s): BNP in the last 8760 hours.  ProBNP (last 3 results) No results for input(s): PROBNP in the last 8760 hours.  CBG: Recent Labs  Lab 03/08/20 0743 03/08/20 1217 03/08/20 1723 03/08/20 2016 03/08/20 2250  GLUCAP 165* 154* 133* 134* 137*   Recent Results (from the past 240 hour(s))  Blood culture (routine x 2)     Status: None (Preliminary result)   Collection Time: 03/04/20  7:05 PM   Specimen: BLOOD  Result Value Ref Range Status   Specimen Description BLOOD SITE NOT SPECIFIED  Final   Special Requests   Final    BOTTLES DRAWN AEROBIC AND ANAEROBIC Blood Culture results may not be optimal due to an inadequate volume of blood received in culture bottles   Culture   Final    NO GROWTH 4 DAYS Performed at [facility], [place] [address] [place], [place] [address]    Report Status PENDING  Incomplete  Blood culture (routine x 2)     Status: None (Preliminary result)   Collection Time: 03/04/20  7:10 PM   Specimen: BLOOD  Result Value Ref Range Status   Specimen Description BLOOD BLOOD LEFT FOREARM  Final   Special Requests   Final    BOTTLES DRAWN AEROBIC AND ANAEROBIC Blood Culture results may not be optimal due to an inadequate volume of blood received in culture bottles   Culture   Final    NO GROWTH 4 DAYS Performed at [facility], [place] [address], [place], [place] [address]    Report Status PENDING  Incomplete  SARS CORONAVIRUS 2 (TAT 6-24 HRS) Nasopharyngeal Urine, Catheterized     Status: None   Collection Time: 03/04/20  7:54 PM   Specimen: Urine, Catheterized; Nasopharyngeal  Result Value Ref Range  Status   SARS Coronavirus 2 NEGATIVE NEGATIVE Final    Comment: (NOTE) SARS-CoV-2 target nucleic acids are NOT DETECTED.  The SARS-CoV-2 RNA is generally detectable in upper and lower respiratory specimens during the acute phase of infection. Negative results do not preclude SARS-CoV-2 infection, do not rule out co-infections with other pathogens, and should not be used as the sole basis for treatment or other patient management decisions. Negative results must be combined with clinical observations, patient history, and epidemiological information. The expected result is Negative.  Fact Sheet for Patients: SugarRoll.be  Fact Sheet for Healthcare Providers: https://www.woods-mathews.com/  This test is not yet approved or cleared by the [place] FDA and  has been authorized for detection and/or diagnosis of SARS-CoV-2 by FDA under an Emergency Use Authorization (EUA). This EUA will remain  in effect (meaning this test can be used) for the duration of the COVID-19 declaration under Se ction 564(b)(1) of the Act, 21 U.S.C. section 360bbb-3(b)(1), unless the authorization is terminated or revoked sooner.  Performed at [facility], [place] [address], [place], [place] [address]   Urine culture     Status: None   Collection Time: 03/04/20  9:02 PM   Specimen: Urine, Random  Result Value Ref Range Status   Specimen Description URINE, RANDOM  Final   Special Requests NONE  Final   Culture   Final    NO GROWTH Performed at [facility],  [address], [place], [place] [address]    Report Status 03/06/2020 FINAL  Final  Aerobic Culture w Gram Stain (superficial specimen)     Status: None   Collection Time: 03/05/20  3:03 AM   Specimen: Wound  Result Value Ref Range Status   Specimen Description WOUND STOMACH  Final   Special Requests NONE  Final   Gram Stain   Final    RARE WBC PRESENT, PREDOMINANTLY PMN MODERATE GRAM  POSITIVE COCCI Performed at [facility], [place] [address], [place], [place] [address]    Culture   Final    ABUNDANT METHICILLIN RESISTANT STAPHYLOCOCCUS AUREUS   Report Status 03/07/2020 FINAL  Final   Organism ID, Bacteria METHICILLIN RESISTANT STAPHYLOCOCCUS AUREUS  Final      Susceptibility   Methicillin resistant staphylococcus aureus - MIC*    CIPROFLOXACIN >=8 RESISTANT Resistant     ERYTHROMYCIN >=8 RESISTANT Resistant     GENTAMICIN <=0.5 SENSITIVE Sensitive     OXACILLIN >=4 RESISTANT Resistant     TETRACYCLINE <=1 SENSITIVE Sensitive     VANCOMYCIN 1 SENSITIVE Sensitive     TRIMETH/SULFA <=10 SENSITIVE Sensitive     CLINDAMYCIN <=0.25 SENSITIVE Sensitive     RIFAMPIN <=0.5 SENSITIVE Sensitive     Inducible Clindamycin NEGATIVE Sensitive     * ABUNDANT METHICILLIN RESISTANT STAPHYLOCOCCUS AUREUS     Studies: No results found.    Flora Lipps, MD  [facility] 03/09/2020  If 7PM-7AM, please contact night-coverage

## 2020-03-09 NOTE — Consult Note (Signed)
WOC Nurse Consult Note: Patient receiving care in [facility] [phone]. Reason for Consult: high output from RUQ ileostomy, frequent leaking I have changed the pouching system to the following items:  PouchKellie Simmering [phone]; skin barrier 644; barrier rings, [phone] due to high output. This was connected to a bedside drainage bag. Val Riles, RN, MSN, CWOCN, CNS-BC, pager [phone]

## 2020-03-10 ENCOUNTER — Other Ambulatory Visit: Payer: Self-pay | Admitting: Physician Assistant

## 2020-03-10 DIAGNOSIS — N289 Disorder of kidney and ureter, unspecified: Secondary | ICD-10-CM

## 2020-03-10 DIAGNOSIS — E1122 Type 2 diabetes mellitus with diabetic chronic kidney disease: Secondary | ICD-10-CM | POA: Diagnosis not present

## 2020-03-10 DIAGNOSIS — N184 Chronic kidney disease, stage 4 (severe): Secondary | ICD-10-CM | POA: Diagnosis not present

## 2020-03-10 DIAGNOSIS — N179 Acute kidney failure, unspecified: Secondary | ICD-10-CM

## 2020-03-10 DIAGNOSIS — N170 Acute kidney failure with tubular necrosis: Secondary | ICD-10-CM | POA: Diagnosis not present

## 2020-03-10 DIAGNOSIS — L97513 Non-pressure chronic ulcer of other part of right foot with necrosis of muscle: Secondary | ICD-10-CM | POA: Diagnosis not present

## 2020-03-10 LAB — CBC
HCT: 26.8 % — ABNORMAL LOW (ref 39.0–52.0)
Hemoglobin: 8.3 g/dL — ABNORMAL LOW (ref 13.0–17.0)
MCH: 28.3 pg (ref 26.0–34.0)
MCHC: 31 g/dL (ref 30.0–36.0)
MCV: 91.5 fL (ref 80.0–100.0)
Platelets: 272 10*3/uL (ref 150–400)
RBC: 2.93 MIL/uL — ABNORMAL LOW (ref 4.22–5.81)
RDW: 17.2 % — ABNORMAL HIGH (ref 11.5–15.5)
WBC: 11.2 10*3/uL — ABNORMAL HIGH (ref 4.0–10.5)
nRBC: 0.2 % (ref 0.0–0.2)

## 2020-03-10 LAB — COMPREHENSIVE METABOLIC PANEL
ALT: 26 U/L (ref 0–44)
AST: 21 U/L (ref 15–41)
Albumin: 2 g/dL — ABNORMAL LOW (ref 3.5–5.0)
Alkaline Phosphatase: 51 U/L (ref 38–126)
Anion gap: 7 (ref 5–15)
BUN: 45 mg/dL — ABNORMAL HIGH (ref 8–23)
CO2: 22 mmol/L (ref 22–32)
Calcium: 8.5 mg/dL — ABNORMAL LOW (ref 8.9–10.3)
Chloride: 108 mmol/L (ref 98–111)
Creatinine, Ser: 2.27 mg/dL — ABNORMAL HIGH (ref 0.61–1.24)
GFR, Estimated: 32 mL/min — ABNORMAL LOW (ref >60)
Glucose, Bld: 108 mg/dL — ABNORMAL HIGH (ref 70–99)
Potassium: 3.9 mmol/L (ref 3.5–5.1)
Sodium: 137 mmol/L (ref 135–145)
Total Bilirubin: 0.7 mg/dL (ref 0.3–1.2)
Total Protein: 5.3 g/dL — ABNORMAL LOW (ref 6.5–8.1)

## 2020-03-10 LAB — C-REACTIVE PROTEIN: CRP: 0.8 mg/dL (ref <1.0)

## 2020-03-10 LAB — PHOSPHORUS: Phosphorus: 2.1 mg/dL — ABNORMAL LOW (ref 2.5–4.6)

## 2020-03-10 LAB — HEPATITIS C ANTIBODY: HCV Ab: REACTIVE — AB

## 2020-03-10 LAB — MAGNESIUM: Magnesium: 1.7 mg/dL (ref 1.7–2.4)

## 2020-03-10 LAB — SEDIMENTATION RATE: Sed Rate: 48 mm/hr — ABNORMAL HIGH (ref 0–16)

## 2020-03-10 MED ORDER — K PHOS MONO-SOD PHOS DI & MONO 155-852-130 MG PO TABS
500.0000 mg | ORAL_TABLET | Freq: Three times a day (TID) | ORAL | Status: AC
  Administered 2020-03-10 (×3): 500 mg via ORAL
  Filled 2020-03-10 (×3): qty 2

## 2020-03-10 NOTE — Progress Notes (Addendum)
PROGRESS NOTE  Govind Lysaght [id number] DOB: 01-18-1955 DOA: 03/04/2020 PCP: [name], MD   LOS: 6 days   Brief narrative:  65 year old male with past medical history of diabetes mellitus type 2, CKD stage IV, severe protein calorie malnutrition (S/P PEG), hypertension, chronic urinary retention with indwelling Foley catheter presented to to the hospital from [facility] via EMS for abnormal labs including recurrent hyponatremia, hyperkalemia and elevated creatinine.  Patient does have a history of complicated hospitalization in the past after AAA repair.  Patient had acute kidney injury at that time and required tunneled hemodialysis catheter placement and CRRT.  He also had mesenteric ischemia and underwent small bowel resection post ileostomy at this time.  He was intubated and required pericardiocentesis and drain placement on October 2021.   Patient additionally developed necrosis of the foreskin of the penis with urinary retention requiring blunt dissection and placement of a Foley catheter.  Patient eventually recovered, was extubated and was able to be transitioned off of renal replacement therapy in November  with a new baseline creatinine of approximately 3.0 (CKD IV).  Patient was discharged to [facility] 11/25/2019.  With renal function improvement, patient was eventually discharged on 02/25/20 back to [facility] skilled nursing facility.    After discharge to skilled nursing facility, patient continued to have generalized weakness.  A routine blood work done in the skilled nursing facility revealed hyperkalemia, hyponatremia and acute kidney injury.  In the ED, patient was noted to have creatinine of 10.0 BUN of 84, mild hyperkalemia 5.2 with hyponatremia of 126.  Patient was also found to be hypotensive with initial systolic blood pressures in the 80s.  Lactate was within normal limits.  Bladder scan and bilateral renal ultrasound performed the emergency department revealed no  evidence of outlet obstruction.    Due to concern for Addison's disease patient was given hydrocortisone and was admitted hospital for further evaluation and treatment.   During hospitalization, patient was seen by orthopedics Dr. Sharol Given who recommended transmetatarsal amputation.  He did not initially agreed to it so second opinion was done by Dr. Stann Mainland orthopedics also recommended transmetatarsal amputation..  Infectious disease was consulted regarding antibiotic management.  At this time, patient has changed his mind and is willing to undergo transmetatarsal amputation on 03/11/2020.   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    Improving.  Continue Foley catheter.  Nephrology was consulted and have signed off at this time,  intake and output charting.  off IV fluids.  Lab Results  Component Value Date   CREATININE 2.27 (H) 03/10/2020   CREATININE 2.59 (H) 03/09/2020   CREATININE 3.34 (H) 03/08/2020    Hypotension.    improved at this time.  Continue fludrocortisone from home.  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    Patient was recently evaluated by vascular surgery at [facility] on 2/15 and had an unremarkable ABI.    x-ray of right foot with lucency involving the fifth metacarpal and proximal phalanx concerning for osteomyelitis.  Extensive soft tissue swelling in the forefoot.  Patient was initially on clindamycin, vancomycin and Merrem.  Antibiotics has been discontinued at this time as per ID.  With Dr. Drucilla Schmidt ID.  Recommends initiation of antibiotic after surgery. 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  Orthopedics Dr. Sharol Given has seen the patient and recommending transmetatarsal amputation.  Patient wished for second opinion and patient has been seen by Dr. Stann Mainland, orthopedics who also recommend transmetatarsal amputation.  Patient has opted to undergo transmetatarsal amputation.  Chronic urinary retention/open wound of penis.  Currently on a Foley catheter.  We will continue.  Mild recurrent hyponatremia.    Continue to monitor.  Sodium of 137 at this time  Hypokalemia improved with replacement.  We will continue to monitor  Hypophosphatemia.  Continue replacement with Neutra-Phos.  Hypomagnesemia.  Improved after replacement.   Chronic abdominal wound greenish discharge with ileostomy.  Wound care on board. On  broad-spectrum antibiotics.  Severe protein calorie malnutrition.    Present on admission.  Continue nutritional supplements.    Anemia of chronic kidney disease IV.  Patient received 1 unit of packed RBC for low hemoglobin.  Hemoglobin of 8.3 today.  GERD.  Continue PPI.  Diabetes mellitus II with CKD.  Continue Accu-Cheks, diabetic diet sliding scale insulin.   DVT prophylaxis: heparin injection 5,000 Units Start: 03/05/20 0600   Code Status: Full code  Family Communication:  Spoke with the patient at bedside.  Status is: Inpatient  Remains inpatient appropriate because:IV treatments appropriate due to intensity of illness or inability to take PO and Inpatient level of care appropriate due to severity of illness,  need for transmetatarsal  amputation   Dispo: The patient is from: Skilled nursing facility              Anticipated d/c is to: Skilled nursing facility  Anticipated discharge date: 1 to 2 days.  Patient wishes IV antibiotic.  We will get  ID involved.              Patient currently is not medically stable to d/c.   Difficult to place patient No   Consultants:  Orthopedics  Nephrology  Infectious disease.  Procedures:  Transfusion of packed RBC.  Anti-infectives:  Marland Kitchen Vancomycin, Merrem and clindamycin 2/24>2/28  Anti-infectives (From admission, onward)   Start     Dose/Rate Route Frequency Ordered Stop   03/09/20 1800  vancomycin (VANCOREADY) IVPB 1000 mg/200 mL  Status:  Discontinued        1,000 mg 200 mL/hr over 60 Minutes Intravenous  Once 03/09/20 1015 03/09/20 1318   03/08/20 1400  meropenem (MERREM) 1 g in sodium chloride 0.9 % 100 mL IVPB  Status:  Discontinued        1 g 200 mL/hr over 30 Minutes Intravenous Every 12 hours 03/08/20 0922 03/09/20 1318   03/07/20 0315  vancomycin (VANCOREADY) IVPB 1000 mg/200 mL        1,000 mg 200 mL/hr over 60 Minutes Intravenous  Once 03/07/20 0223 03/07/20 0449   03/06/20 0000  vancomycin variable dose per unstable renal function (pharmacist dosing)  Status:  Discontinued  Does not apply See admin instructions 03/05/20 0926 03/09/20 1318   03/05/20 0300  meropenem (MERREM) 1 g in sodium chloride 0.9 % 100 mL IVPB  Status:  Discontinued        1 g 200 mL/hr over 30 Minutes Intravenous Every 24 hours 03/05/20 0231 03/08/20 0922   03/05/20 0245  vancomycin (VANCOREADY) IVPB 1250 mg/250 mL        1,250 mg 166.7 mL/hr over 90 Minutes Intravenous  Once 03/05/20 0231 03/05/20 0525   03/05/20 0230  clindamycin (CLEOCIN) IVPB 600 mg        600 mg 100 mL/hr over 30 Minutes Intravenous Every 8 hours 03/05/20 0223 03/07/20 2255      Subjective: Today, patient was seen and examined at bedside.  Denies any nausea vomiting fever chills or rigor.  He has  changed his mind and willing to talk about amputation Objective: Vitals:   03/10/20 0017 03/10/20 0431  BP: 125/82 122/72  Pulse:    Resp: 16 16  Temp: 97.8 F (36.6 C) 98 F (36.7 C)  SpO2: 100% 99%    Intake/Output Summary (Last 24 hours) at 03/10/2020 1048 Last data filed at 03/10/2020 0708 Gross per 24 hour  Intake 2621.81 ml  Output 4625 ml  Net -2003.19 ml   Filed Weights   03/05/20 1639 03/09/20 0400 03/10/20 0448  Weight: 55.3 kg 53.7 kg 53.7 kg   Body mass index is 16.51 kg/m.   Physical Exam:  General: Thinly built, not in obvious distress HENT:   Mild pallor noted, Oral mucosa is moist.  Chest:  Clear breath sounds.  Diminished breath sounds bilaterally. No crackles or wheezes.  CVS: S1 &S2 heard. No murmur.  Regular rate and rhythm. Abdomen: Soft, nontender, nondistended.  Bowel sounds are heard.  Ileostomy bag in place, PEG tube in place.  Abdominal wall dressing. Extremities: Right foot with necrotic toe ulceration, left above-knee amputation. Psych: Alert, awake and oriented, normal mood CNS:  No cranial nerve deficits.  Power equal in all extremities.   Skin: Right foot with necrotic toes.  Data Review: I have personally reviewed the following laboratory data and studies,  CBC: Recent Labs  Lab 03/05/20 0420 03/06/20 0141 03/07/20 0130 03/08/20 0315 03/09/20 0057 03/10/20 0059  WBC 11.0* 11.1* 11.0* 10.1 12.0* 11.2*  NEUTROABS 8.9*  --   --   --   --   --   HGB 7.3* 6.8* 7.9* 7.7* 8.3* 8.3*  HCT 23.4* 22.2* 24.8* 23.8* 26.2* 26.8*  MCV 92.9 91.0 89.5 90.2 88.8 91.5  PLT 233 261 220 230 273 [id number]   Basic Metabolic Panel: Recent Labs  Lab 03/05/20 0420 03/05/20 1643 03/06/20 0141 03/07/20 0130 03/08/20 0315 03/09/20 0057 03/10/20 0059  NA 130*   < > 136 133* 139 136 137  K 5.6*   < > 5.3* 4.4 3.4* 3.0* 3.9  CL 95*   < > 101 106 114* 109 108  CO2 20*   < > 20* 18* 15* 19* 22  GLUCOSE 138*   < > 145* 153* 172* 129* 108*  BUN 84*   < > 80*  71* 54* 50* 45*  CREATININE 9.29*   < > 7.44* 5.27* 3.34* 2.59* 2.27*  CALCIUM 9.4   < > 9.2 8.7* 7.4* 8.2* 8.5*  MG 2.0  --  1.9 1.6* 1.1* 2.0 1.7  PHOS 8.2*  --  6.6* 5.1*  --  1.8* 2.1*   < > = values in this interval not displayed.  Liver Function Tests: Recent Labs  Lab 03/05/20 0420 03/06/20 0141 03/09/20 0057 03/10/20 0059  AST  --   --  15 21  ALT  --   --  18 26  ALKPHOS  --   --  53 51  BILITOT  --   --  0.7 0.7  PROT  --   --  5.6* 5.3*  ALBUMIN 2.4* 2.1* 2.0* 2.0*   No results for input(s): LIPASE, AMYLASE in the last 168 hours. No results for input(s): AMMONIA in the last 168 hours. Cardiac Enzymes: Recent Labs  Lab 03/04/20 1954 03/06/20 0141  CKTOTAL 26* 16*   BNP (last 3 results) No results for input(s): BNP in the last 8760 hours.  ProBNP (last 3 results) No results for input(s): PROBNP in the last 8760 hours.  CBG: Recent Labs  Lab 03/08/20 0743 03/08/20 1217 03/08/20 1723 03/08/20 2016 03/08/20 2250  GLUCAP 165* 154* 133* 134* 137*   Recent Results (from the past 240 hour(s))  Blood culture (routine x 2)     Status: None   Collection Time: 03/04/20  7:05 PM   Specimen: BLOOD  Result Value Ref Range Status   Specimen Description BLOOD SITE NOT SPECIFIED  Final   Special Requests   Final    BOTTLES DRAWN AEROBIC AND ANAEROBIC Blood Culture results may not be optimal due to an inadequate volume of blood received in culture bottles   Culture   Final    NO GROWTH 5 DAYS Performed at [facility], [place] [address], [place], [place] [address]    Report Status 03/09/2020 FINAL  Final  Blood culture (routine x 2)     Status: None   Collection Time: 03/04/20  7:10 PM   Specimen: BLOOD  Result Value Ref Range Status   Specimen Description BLOOD BLOOD LEFT FOREARM  Final   Special Requests   Final    BOTTLES DRAWN AEROBIC AND ANAEROBIC Blood Culture results may not be optimal due to an inadequate volume of blood received in culture bottles    Culture   Final    NO GROWTH 5 DAYS Performed at [facility], [place] [address], [place], [place] [address]    Report Status 03/09/2020 FINAL  Final  SARS CORONAVIRUS 2 (TAT 6-24 HRS) Nasopharyngeal Urine, Catheterized     Status: None   Collection Time: 03/04/20  7:54 PM   Specimen: Urine, Catheterized; Nasopharyngeal  Result Value Ref Range Status   SARS Coronavirus 2 NEGATIVE NEGATIVE Final    Comment: (NOTE) SARS-CoV-2 target nucleic acids are NOT DETECTED.  The SARS-CoV-2 RNA is generally detectable in upper and lower respiratory specimens during the acute phase of infection. Negative results do not preclude SARS-CoV-2 infection, do not rule out co-infections with other pathogens, and should not be used as the sole basis for treatment or other patient management decisions. Negative results must be combined with clinical observations, patient history, and epidemiological information. The expected result is Negative.  Fact Sheet for Patients: SugarRoll.be  Fact Sheet for Healthcare Providers: https://www.woods-mathews.com/  This test is not yet approved or cleared by the [place] FDA and  has been authorized for detection and/or diagnosis of SARS-CoV-2 by FDA under an Emergency Use Authorization (EUA). This EUA will remain  in effect (meaning this test can be used) for the duration of the COVID-19 declaration under Se ction 564(b)(1) of the Act, 21 U.S.C. section 360bbb-3(b)(1), unless the authorization is terminated or revoked sooner.  Performed at  [facility], [place] [address], [place], [place] [address]   Urine culture     Status: None   Collection Time: 03/04/20  9:02 PM   Specimen: Urine, Random  Result Value Ref Range Status   Specimen Description URINE, RANDOM  Final   Special Requests NONE  Final   Culture   Final    NO GROWTH Performed at [facility], [place] [address], [place], [place] [address]     Report Status 03/06/2020 FINAL  Final  Aerobic Culture w Gram Stain (superficial specimen)     Status: None   Collection Time: 03/05/20  3:03 AM   Specimen: Wound  Result Value Ref Range Status   Specimen Description WOUND STOMACH  Final   Special Requests NONE  Final   Gram Stain   Final    RARE WBC PRESENT, PREDOMINANTLY PMN MODERATE GRAM POSITIVE COCCI Performed at [facility], [place] [address], [place],  [address]    Culture   Final    ABUNDANT METHICILLIN RESISTANT STAPHYLOCOCCUS AUREUS   Report Status 03/07/2020 FINAL  Final   Organism ID, Bacteria METHICILLIN RESISTANT STAPHYLOCOCCUS AUREUS  Final      Susceptibility   Methicillin resistant staphylococcus aureus - MIC*    CIPROFLOXACIN >=8 RESISTANT Resistant     ERYTHROMYCIN >=8 RESISTANT Resistant     GENTAMICIN <=0.5 SENSITIVE Sensitive     OXACILLIN >=4 RESISTANT Resistant     TETRACYCLINE <=1 SENSITIVE Sensitive     VANCOMYCIN 1 SENSITIVE Sensitive     TRIMETH/SULFA <=10 SENSITIVE Sensitive     CLINDAMYCIN <=0.25 SENSITIVE Sensitive     RIFAMPIN <=0.5 SENSITIVE Sensitive     Inducible Clindamycin NEGATIVE Sensitive     * ABUNDANT METHICILLIN RESISTANT STAPHYLOCOCCUS AUREUS     Studies: No results found.    Flora Lipps, MD  [facility] 03/10/2020  If 7PM-7AM, please contact night-coverage

## 2020-03-10 NOTE — Progress Notes (Signed)
Subjective:  Patient wants to make sure that everything is carefully considered before surgery but he seems agreeable to it now.   Antibiotics:  Anti-infectives (From admission, onward)   Start     Dose/Rate Route Frequency Ordered Stop   03/09/20 1800  vancomycin (VANCOREADY) IVPB 1000 mg/200 mL  Status:  Discontinued        1,000 mg 200 mL/hr over 60 Minutes Intravenous  Once 03/09/20 1015 03/09/20 1318   03/08/20 1400  meropenem (MERREM) 1 g in sodium chloride 0.9 % 100 mL IVPB  Status:  Discontinued        1 g 200 mL/hr over 30 Minutes Intravenous Every 12 hours 03/08/20 0922 03/09/20 1318   03/07/20 0315  vancomycin (VANCOREADY) IVPB 1000 mg/200 mL        1,000 mg 200 mL/hr over 60 Minutes Intravenous  Once 03/07/20 0223 03/07/20 0449   03/06/20 0000  vancomycin variable dose per unstable renal function (pharmacist dosing)  Status:  Discontinued         Does not apply See admin instructions 03/05/20 0926 03/09/20 1318   03/05/20 0300  meropenem (MERREM) 1 g in sodium chloride 0.9 % 100 mL IVPB  Status:  Discontinued        1 g 200 mL/hr over 30 Minutes Intravenous Every 24 hours 03/05/20 0231 03/08/20 0922   03/05/20 0245  vancomycin (VANCOREADY) IVPB 1250 mg/250 mL        1,250 mg 166.7 mL/hr over 90 Minutes Intravenous  Once 03/05/20 0231 03/05/20 0525   03/05/20 0230  clindamycin (CLEOCIN) IVPB 600 mg        600 mg 100 mL/hr over 30 Minutes Intravenous Every 8 hours 03/05/20 0223 03/07/20 2255      Medications: Scheduled Meds: . atorvastatin  40 mg Oral Daily  . Chlorhexidine Gluconate Cloth  6 each Topical Daily  . darbepoetin (ARANESP) injection - NON-DIALYSIS  40 mcg Subcutaneous Q Fri-1800  . feeding supplement (NEPRO CARB STEADY)  237 mL Oral BID BM  . feeding supplement (PROSource TF)  45 mL Per Tube BID  . fludrocortisone  0.1 mg Oral Daily  . gabapentin  200 mg Oral QHS  . heparin  5,000 Units Subcutaneous Q8H  . insulin aspart  0-9 Units  Subcutaneous TID AC & HS  . liver oil-zinc oxide   Topical BID  . melatonin  5 mg Oral QHS  . multivitamin  1 tablet Oral QHS  . pantoprazole  20 mg Oral Daily  . phosphorus  500 mg Oral TID  . sodium bicarbonate  1,300 mg Oral BID   Continuous Infusions: . feeding supplement (OSMOLITE 1.5 CAL) Stopped (03/10/20 0708)   PRN Meds:.acetaminophen **OR** acetaminophen, calcium carbonate, fentaNYL (SUBLIMAZE) injection, hydrOXYzine, ipratropium-albuterol, ondansetron **OR** ondansetron (ZOFRAN) IV    Objective: Weight change: 0 kg  Intake/Output Summary (Last 24 hours) at 03/10/2020 1214 Last data filed at 03/10/2020 0708 Gross per 24 hour  Intake 2621.81 ml  Output 4625 ml  Net -2003.19 ml   Blood pressure 119/71, pulse 90, temperature 97.9 F (36.6 C), temperature source Oral, resp. rate 18, height '5\' 11"'$  (1.803 m), weight 53.7 kg, SpO2 100 %. Temp:  [97.7 F (36.5 C)-98.3 F (36.8 C)] 97.9 F (36.6 C) (03/01 1132) Pulse Rate:  [82-101] 90 (03/01 1132) Resp:  [14-18] 18 (03/01 1132) BP: (119-144)/(71-87) 119/71 (03/01 1132) SpO2:  [99 %-100 %] 100 % (03/01 1132) Weight:  [53.7 kg] 53.7 kg (03/01 0448)  Physical Exam: Physical Exam HENT:     Head: Normocephalic and atraumatic.  Pulmonary:     Effort: Pulmonary effort is normal. No respiratory distress.  Abdominal:     General: There is no distension.  Neurological:     General: No focal deficit present.     Mental Status: He is alert.  Psychiatric:        Mood and Affect: Mood is anxious.        Speech: Speech normal.        Behavior: Behavior normal. Behavior is cooperative.        Thought Content: Thought content normal.     Gangrenous changes still present in foot  CBC:    BMET Recent Labs    03/09/20 0057 03/10/20 0059  NA 136 137  K 3.0* 3.9  CL 109 108  CO2 19* 22  GLUCOSE 129* 108*  BUN 50* 45*  CREATININE 2.59* 2.27*  CALCIUM 8.2* 8.5*     Liver Panel  Recent Labs    03/09/20 0057  03/10/20 0059  PROT 5.6* 5.3*  ALBUMIN 2.0* 2.0*  AST 15 21  ALT 18 26  ALKPHOS 53 51  BILITOT 0.7 0.7       Sedimentation Rate Recent Labs    03/10/20 0059  ESRSEDRATE 48*   C-Reactive Protein Recent Labs    03/10/20 0059  CRP 0.8    Micro Results: Recent Results (from the past 720 hour(s))  Blood culture (routine x 2)     Status: None   Collection Time: 03/04/20  7:05 PM   Specimen: BLOOD  Result Value Ref Range Status   Specimen Description BLOOD SITE NOT SPECIFIED  Final   Special Requests   Final    BOTTLES DRAWN AEROBIC AND ANAEROBIC Blood Culture results may not be optimal due to an inadequate volume of blood received in culture bottles   Culture   Final    NO GROWTH 5 DAYS Performed at [facility], [place] [address], [place], [place] [address]    Report Status 03/09/2020 FINAL  Final  Blood culture (routine x 2)     Status: None   Collection Time: 03/04/20  7:10 PM   Specimen: BLOOD  Result Value Ref Range Status   Specimen Description BLOOD BLOOD LEFT FOREARM  Final   Special Requests   Final    BOTTLES DRAWN AEROBIC AND ANAEROBIC Blood Culture results may not be optimal due to an inadequate volume of blood received in culture bottles   Culture   Final    NO GROWTH 5 DAYS Performed at [facility], [place] [address], [place],  [address]    Report Status 03/09/2020 FINAL  Final  SARS CORONAVIRUS 2 (TAT 6-24 HRS) Nasopharyngeal Urine, Catheterized     Status: None   Collection Time: 03/04/20  7:54 PM   Specimen: Urine, Catheterized; Nasopharyngeal  Result Value Ref Range Status   SARS Coronavirus 2 NEGATIVE NEGATIVE Final    Comment: (NOTE) SARS-CoV-2 target nucleic acids are NOT DETECTED.  The SARS-CoV-2 RNA is generally detectable in upper and lower respiratory specimens during the acute phase of infection. Negative results do not preclude SARS-CoV-2 infection, do not rule out co-infections with other pathogens, and should not be  used as the sole basis for treatment or other patient management decisions. Negative results must be combined with clinical observations, patient history, and epidemiological information. The expected result is Negative.  Fact Sheet for Patients: SugarRoll.be  Fact Sheet  for Healthcare Providers: https://www.woods-mathews.com/  This test is not yet approved or cleared by the [place] and  has been authorized for detection and/or diagnosis of SARS-CoV-2 by FDA under an Emergency Use Authorization (EUA). This EUA will remain  in effect (meaning this test can be used) for the duration of the COVID-19 declaration under Se ction 564(b)(1) of the Act, 21 U.S.C. section 360bbb-3(b)(1), unless the authorization is terminated or revoked sooner.  Performed at [facility], [place] [address] [place], [place] [address]   Urine culture     Status: None   Collection Time: 03/04/20  9:02 PM   Specimen: Urine, Random  Result Value Ref Range Status   Specimen Description URINE, RANDOM  Final   Special Requests NONE  Final   Culture   Final    NO GROWTH Performed at [facility], [place] [address], [place], [place] [address]    Report Status 03/06/2020 FINAL  Final  Aerobic Culture w Gram Stain (superficial specimen)     Status: None   Collection Time: 03/05/20  3:03 AM   Specimen: Wound  Result Value Ref Range Status   Specimen Description WOUND STOMACH  Final   Special Requests NONE  Final   Gram Stain   Final    RARE WBC PRESENT, PREDOMINANTLY PMN MODERATE GRAM POSITIVE COCCI Performed at [facility], [place] [address] [place], [place] [address]    Culture   Final    ABUNDANT METHICILLIN RESISTANT STAPHYLOCOCCUS AUREUS   Report Status 03/07/2020 FINAL  Final   Organism ID, Bacteria METHICILLIN RESISTANT STAPHYLOCOCCUS AUREUS  Final      Susceptibility   Methicillin resistant staphylococcus aureus - MIC*    CIPROFLOXACIN  >=8 RESISTANT Resistant     ERYTHROMYCIN >=8 RESISTANT Resistant     GENTAMICIN <=0.5 SENSITIVE Sensitive     OXACILLIN >=4 RESISTANT Resistant     TETRACYCLINE <=1 SENSITIVE Sensitive     VANCOMYCIN 1 SENSITIVE Sensitive     TRIMETH/SULFA <=10 SENSITIVE Sensitive     CLINDAMYCIN <=0.25 SENSITIVE Sensitive     RIFAMPIN <=0.5 SENSITIVE Sensitive     Inducible Clindamycin NEGATIVE Sensitive     * ABUNDANT METHICILLIN RESISTANT STAPHYLOCOCCUS AUREUS    Studies/Results: No results found.    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  Dr. Sharol Given is planning on transmetatarsal amputation tomorrow in the operating room.  If he will send cultures including tissue and bone to microbiology lab  we can then hopefully tailor antibiotics.  We will consider whether or not to use IV versus oral antibiotics after he is gotten through his  surgery.     LOS: 6 days   Alcide Evener 03/10/2020, 12:14 PM

## 2020-03-10 NOTE — H&P (View-Only) (Signed)
Patient ID: Curtis Solis, male   DOB: Oct 08, 1955, 64 y.o.   MRN: [id number] I spoke with the patient his wife, who is his power of attorney, she agrees to proceed with a transmetatarsal amputation on the right tomorrow, she will be available by phone to authorize surgery.  Plan for surgery Wednesday afternoon tomorrow.

## 2020-03-10 NOTE — Progress Notes (Signed)
Patient ID: Curtis Solis, male   DOB: 1955/03/05, 65 y.o.   MRN: [id number] I spoke with the patient his wife, who is his power of attorney, she agrees to proceed with a transmetatarsal amputation on the right tomorrow, she will be available by phone to authorize surgery.  Plan for surgery Wednesday afternoon tomorrow.

## 2020-03-10 NOTE — Progress Notes (Signed)
This is a pleasant 65 year old gentleman that Dr. Sharol Given has consulted on regarding his necrotic toes. He is recommended a transmetatarsal amputation. Because of the patient's recent below-knee amputation he is very hesitant to go forward. He has obtained a second opinion as well as input from infectious disease and they have both told the patient that he would need to go forward with a transmetatarsal amputation  Patient had several questions for me today regarding length of surgery length of recovery level of amputation risks and recovery.  I reviewed all this with him including bleeding continued infection anesthesia wound dehiscence and need for further surgery. He thinks he will probably go forward but he would like for Dr. Sharol Given to contact his wife or his son today. I have forwarded those contacts to Dr. Sharol Given. If he wishes to go forward with plan for surgery tomorrow

## 2020-03-11 ENCOUNTER — Inpatient Hospital Stay (HOSPITAL_COMMUNITY): Payer: BLUE CROSS/BLUE SHIELD | Admitting: Anesthesiology

## 2020-03-11 ENCOUNTER — Encounter (HOSPITAL_COMMUNITY)
Admission: EM | Disposition: A | Discharge status: Skilled Nursing Facility | Payer: Self-pay | Source: Skilled Nursing Facility | Attending: Internal Medicine

## 2020-03-11 ENCOUNTER — Encounter (HOSPITAL_COMMUNITY): Payer: Self-pay | Admitting: Internal Medicine

## 2020-03-11 DIAGNOSIS — M86571 Other chronic hematogenous osteomyelitis, right ankle and foot: Secondary | ICD-10-CM

## 2020-03-11 DIAGNOSIS — L97513 Non-pressure chronic ulcer of other part of right foot with necrosis of muscle: Secondary | ICD-10-CM | POA: Diagnosis not present

## 2020-03-11 DIAGNOSIS — I96 Gangrene, not elsewhere classified: Secondary | ICD-10-CM | POA: Diagnosis not present

## 2020-03-11 DIAGNOSIS — N289 Disorder of kidney and ureter, unspecified: Secondary | ICD-10-CM

## 2020-03-11 DIAGNOSIS — E1122 Type 2 diabetes mellitus with diabetic chronic kidney disease: Secondary | ICD-10-CM | POA: Diagnosis not present

## 2020-03-11 DIAGNOSIS — S31109D Unspecified open wound of abdominal wall, unspecified quadrant without penetration into peritoneal cavity, subsequent encounter: Secondary | ICD-10-CM

## 2020-03-11 DIAGNOSIS — M869 Osteomyelitis, unspecified: Secondary | ICD-10-CM

## 2020-03-11 HISTORY — PX: AMPUTATION: SHX166

## 2020-03-11 LAB — COMPREHENSIVE METABOLIC PANEL
ALT: 30 U/L (ref 0–44)
AST: 25 U/L (ref 15–41)
Albumin: 2.1 g/dL — ABNORMAL LOW (ref 3.5–5.0)
Alkaline Phosphatase: 40 U/L (ref 38–126)
Anion gap: 7 (ref 5–15)
BUN: 41 mg/dL — ABNORMAL HIGH (ref 8–23)
CO2: 21 mmol/L — ABNORMAL LOW (ref 22–32)
Calcium: 8.6 mg/dL — ABNORMAL LOW (ref 8.9–10.3)
Chloride: 109 mmol/L (ref 98–111)
Creatinine, Ser: 2.23 mg/dL — ABNORMAL HIGH (ref 0.61–1.24)
GFR, Estimated: 33 mL/min — ABNORMAL LOW (ref >60)
Glucose, Bld: 98 mg/dL (ref 70–99)
Potassium: 3.8 mmol/L (ref 3.5–5.1)
Sodium: 137 mmol/L (ref 135–145)
Total Bilirubin: 1 mg/dL (ref 0.3–1.2)
Total Protein: 5.1 g/dL — ABNORMAL LOW (ref 6.5–8.1)

## 2020-03-11 LAB — CBC
HCT: 24.3 % — ABNORMAL LOW (ref 39.0–52.0)
Hemoglobin: 7.7 g/dL — ABNORMAL LOW (ref 13.0–17.0)
MCH: 29.1 pg (ref 26.0–34.0)
MCHC: 31.7 g/dL (ref 30.0–36.0)
MCV: 91.7 fL (ref 80.0–100.0)
Platelets: 259 10*3/uL (ref 150–400)
RBC: 2.65 MIL/uL — ABNORMAL LOW (ref 4.22–5.81)
RDW: 17.7 % — ABNORMAL HIGH (ref 11.5–15.5)
WBC: 11 10*3/uL — ABNORMAL HIGH (ref 4.0–10.5)
nRBC: 0 % (ref 0.0–0.2)

## 2020-03-11 LAB — SURGICAL PCR SCREEN
MRSA, PCR: POSITIVE — AB
Staphylococcus aureus: POSITIVE — AB

## 2020-03-11 LAB — MAGNESIUM: Magnesium: 1.5 mg/dL — ABNORMAL LOW (ref 1.7–2.4)

## 2020-03-11 LAB — PHOSPHORUS: Phosphorus: 2.8 mg/dL (ref 2.5–4.6)

## 2020-03-11 LAB — GLUCOSE, CAPILLARY: Glucose-Capillary: 81 mg/dL (ref 70–99)

## 2020-03-11 LAB — BRAIN NATRIURETIC PEPTIDE: B Natriuretic Peptide: 244.3 pg/mL — ABNORMAL HIGH (ref 0.0–100.0)

## 2020-03-11 SURGERY — AMPUTATION, FOOT, PARTIAL
Anesthesia: Monitor Anesthesia Care | Site: Foot | Laterality: Right

## 2020-03-11 MED ORDER — CEFAZOLIN SODIUM-DEXTROSE 2-4 GM/100ML-% IV SOLN
2.0000 g | INTRAVENOUS | Status: AC
  Administered 2020-03-11: 2 g via INTRAVENOUS
  Filled 2020-03-11: qty 100

## 2020-03-11 MED ORDER — METOCLOPRAMIDE HCL 10 MG PO TABS: 5.0000 mg | ORAL_TABLET | Freq: Three times a day (TID) | ORAL | Status: DC | PRN

## 2020-03-11 MED ORDER — LINEZOLID 600 MG PO TABS
600.0000 mg | ORAL_TABLET | Freq: Two times a day (BID) | ORAL | Status: DC
  Administered 2020-03-11 – 2020-03-14 (×6): 600 mg via ORAL
  Filled 2020-03-11 (×7): qty 1

## 2020-03-11 MED ORDER — 0.9 % SODIUM CHLORIDE (POUR BTL) OPTIME
TOPICAL | Status: DC | PRN
  Administered 2020-03-11: 1000 mL

## 2020-03-11 MED ORDER — ROPIVACAINE HCL 5 MG/ML IJ SOLN
INTRAMUSCULAR | Status: DC | PRN
  Administered 2020-03-11: 40 mL via PERINEURAL

## 2020-03-11 MED ORDER — CHLORHEXIDINE GLUCONATE 0.12 % MT SOLN
15.0000 mL | OROMUCOSAL | Status: AC
  Filled 2020-03-11: qty 15

## 2020-03-11 MED ORDER — MAGNESIUM SULFATE 4 GM/100ML IV SOLN
4.0000 g | Freq: Once | INTRAVENOUS | Status: AC
  Administered 2020-03-11: 4 g via INTRAVENOUS
  Filled 2020-03-11: qty 100

## 2020-03-11 MED ORDER — FENTANYL CITRATE (PF) 100 MCG/2ML IJ SOLN
25.0000 ug | INTRAMUSCULAR | Status: DC | PRN
Start: 2020-03-11 — End: 2020-03-11

## 2020-03-11 MED ORDER — METOCLOPRAMIDE HCL 5 MG/ML IJ SOLN: 5.0000 mg | Freq: Three times a day (TID) | INTRAMUSCULAR | Status: DC | PRN

## 2020-03-11 MED ORDER — MIDAZOLAM HCL 2 MG/2ML IJ SOLN
INTRAMUSCULAR | Status: AC
  Administered 2020-03-11: 1 mg via INTRAVENOUS
  Filled 2020-03-11: qty 2

## 2020-03-11 MED ORDER — MUPIROCIN 2 % EX OINT
1.0000 "application " | TOPICAL_OINTMENT | Freq: Two times a day (BID) | CUTANEOUS | Status: DC
  Administered 2020-03-11 – 2020-03-14 (×7): 1 via NASAL
  Filled 2020-03-11 (×3): qty 22

## 2020-03-11 MED ORDER — SODIUM CHLORIDE 0.9 % IV SOLN: INTRAVENOUS | Status: DC

## 2020-03-11 MED ORDER — FENTANYL CITRATE (PF) 100 MCG/2ML IJ SOLN
INTRAMUSCULAR | Status: AC
  Administered 2020-03-11: 50 ug via INTRAVENOUS
  Filled 2020-03-11: qty 2

## 2020-03-11 MED ORDER — DEXAMETHASONE SODIUM PHOSPHATE 10 MG/ML IJ SOLN
INTRAMUSCULAR | Status: DC | PRN
  Administered 2020-03-11: 10 mg

## 2020-03-11 MED ORDER — FENTANYL CITRATE (PF) 100 MCG/2ML IJ SOLN: 50.0000 ug | Freq: Once | INTRAMUSCULAR | Status: AC

## 2020-03-11 MED ORDER — MIDAZOLAM HCL 2 MG/2ML IJ SOLN: 1.0000 mg | Freq: Once | INTRAMUSCULAR | Status: AC

## 2020-03-11 MED ORDER — DOCUSATE SODIUM 100 MG PO CAPS
100.0000 mg | ORAL_CAPSULE | Freq: Two times a day (BID) | ORAL | Status: DC
  Administered 2020-03-11 – 2020-03-12 (×2): 100 mg via ORAL
  Filled 2020-03-11 (×2): qty 1

## 2020-03-11 MED ORDER — CHLORHEXIDINE GLUCONATE 0.12 % MT SOLN
OROMUCOSAL | Status: AC
  Administered 2020-03-11: 15 mL via OROMUCOSAL
  Filled 2020-03-11: qty 15

## 2020-03-11 MED ORDER — PROPOFOL 500 MG/50ML IV EMUL
INTRAVENOUS | Status: DC | PRN
  Administered 2020-03-11: 25 ug/kg/min via INTRAVENOUS

## 2020-03-11 MED ORDER — ONDANSETRON HCL 4 MG/2ML IJ SOLN: 4.0000 mg | Freq: Once | INTRAMUSCULAR | Status: DC | PRN

## 2020-03-11 MED ORDER — DIPHENOXYLATE-ATROPINE 2.5-0.025 MG PO TABS
1.0000 | ORAL_TABLET | Freq: Four times a day (QID) | ORAL | Status: DC
  Administered 2020-03-11 – 2020-03-14 (×13): 1 via ORAL
  Filled 2020-03-11 (×13): qty 1

## 2020-03-11 SURGICAL SUPPLY — 34 items
BENZOIN TINCTURE PRP APPL 2/3 (GAUZE/BANDAGES/DRESSINGS) ×2 IMPLANT
BLADE SAW SGTL HD 18.5X60.5X1. (BLADE) ×2 IMPLANT
BLADE SURG 21 STRL SS (BLADE) ×2 IMPLANT
BNDG COHESIVE 4X5 TAN STRL (GAUZE/BANDAGES/DRESSINGS) ×1 IMPLANT
BNDG GAUZE ELAST 4 BULKY (GAUZE/BANDAGES/DRESSINGS) IMPLANT
COVER SURGICAL LIGHT HANDLE (MISCELLANEOUS) ×2 IMPLANT
COVER WAND RF STERILE (DRAPES) ×2 IMPLANT
DRAPE INCISE IOBAN 66X45 STRL (DRAPES) ×2 IMPLANT
DRAPE U-SHAPE 47X51 STRL (DRAPES) ×2 IMPLANT
DRSG ADAPTIC 3X8 NADH LF (GAUZE/BANDAGES/DRESSINGS) IMPLANT
DRSG PAD ABDOMINAL 8X10 ST (GAUZE/BANDAGES/DRESSINGS) IMPLANT
DURAPREP 26ML APPLICATOR (WOUND CARE) ×2 IMPLANT
ELECT REM PT RETURN 9FT ADLT (ELECTROSURGICAL) ×2
ELECTRODE REM PT RTRN 9FT ADLT (ELECTROSURGICAL) ×1 IMPLANT
GAUZE SPONGE 4X4 12PLY STRL (GAUZE/BANDAGES/DRESSINGS) IMPLANT
GLOVE BIOGEL PI IND STRL 9 (GLOVE) ×1 IMPLANT
GLOVE BIOGEL PI INDICATOR 9 (GLOVE) ×1
GLOVE SURG ORTHO 9.0 STRL STRW (GLOVE) ×2 IMPLANT
GOWN STRL REUS W/ TWL XL LVL3 (GOWN DISPOSABLE) ×3 IMPLANT
GOWN STRL REUS W/TWL XL LVL3 (GOWN DISPOSABLE) ×3
KIT BASIN OR (CUSTOM PROCEDURE TRAY) ×2 IMPLANT
KIT DRSG PREVENA PLUS 7DAY 125 (MISCELLANEOUS) ×1 IMPLANT
KIT PREVENA INCISION MGT 13 (CANNISTER) ×1 IMPLANT
KIT TURNOVER KIT B (KITS) ×2 IMPLANT
NS IRRIG 1000ML POUR BTL (IV SOLUTION) ×2 IMPLANT
PACK ORTHO EXTREMITY (CUSTOM PROCEDURE TRAY) ×2 IMPLANT
PAD ARMBOARD 7.5X6 YLW CONV (MISCELLANEOUS) ×4 IMPLANT
SPONGE LAP 18X18 RF (DISPOSABLE) IMPLANT
SUT ETHILON 2 0 PSLX (SUTURE) ×4 IMPLANT
TOWEL GREEN STERILE (TOWEL DISPOSABLE) ×2 IMPLANT
TOWEL GREEN STERILE FF (TOWEL DISPOSABLE) ×2 IMPLANT
TUBE CONNECTING 12X1/4 (SUCTIONS) ×2 IMPLANT
WATER STERILE IRR 1000ML POUR (IV SOLUTION) ×2 IMPLANT
YANKAUER SUCT BULB TIP NO VENT (SUCTIONS) ×2 IMPLANT

## 2020-03-11 NOTE — Plan of Care (Signed)
Patient is currently resting in bed. C/o pain, given fentanyl PRN. Drsg changed this AM. VSS. NPO this AM for surgery today. Tube feedings stopped at 0500. Foley intact. Ileostomy intact. Call bell within reach. Bed alarm on.   Problem: Education: Goal: Knowledge of disease and its progression will improve Outcome: Progressing   Problem: Fluid Volume: Goal: Compliance with measures to maintain balanced fluid volume will improve Outcome: Progressing   Problem: Health Behavior/Discharge Planning: Goal: Ability to manage health-related needs will improve Outcome: Progressing   Problem: Nutritional: Goal: Ability to make healthy dietary choices will improve Outcome: Progressing   Problem: Clinical Measurements: Goal: Complications related to the disease process, condition or treatment will be avoided or minimized Outcome: Progressing   Problem: Education: Goal: Knowledge of General Education information will improve Description: Including pain rating scale, medication(s)/side effects and non-pharmacologic comfort measures Outcome: Progressing   Problem: Health Behavior/Discharge Planning: Goal: Ability to manage health-related needs will improve Outcome: Progressing   Problem: Clinical Measurements: Goal: Ability to maintain clinical measurements within normal limits will improve Outcome: Progressing Goal: Will remain free from infection Outcome: Progressing Goal: Diagnostic test results will improve Outcome: Progressing Goal: Respiratory complications will improve Outcome: Progressing Goal: Cardiovascular complication will be avoided Outcome: Progressing   Problem: Activity: Goal: Risk for activity intolerance will decrease Outcome: Progressing   Problem: Nutrition: Goal: Adequate nutrition will be maintained Outcome: Progressing   Problem: Coping: Goal: Level of anxiety will decrease Outcome: Progressing   Problem: Elimination: Goal: Will not experience  complications related to bowel motility Outcome: Progressing Goal: Will not experience complications related to urinary retention Outcome: Progressing   Problem: Pain Managment: Goal: General experience of comfort will improve Outcome: Progressing   Problem: Safety: Goal: Ability to remain free from injury will improve Outcome: Progressing   Problem: Skin Integrity: Goal: Risk for impaired skin integrity will decrease Outcome: Progressing

## 2020-03-11 NOTE — Progress Notes (Signed)
PROGRESS NOTE        PATIENT DETAILS Name: Curtis Solis Age: 65 y.o. Sex: male Date of Birth: 04/19/1955 Admit Date: 03/04/2020 Admitting Physician Vernelle Emerald, MD PT:[id number], Denton Ar, MD  Brief Narrative: Patient is a 65 y.o. 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  Significant events: 2/23>> admit from SNF for evaluation of AKI, hyperkalemia, hyponatremia and right foot gangrene.  Significant studies: 2/23>> chest x-ray: No active disease 2/23>> renal ultrasound: Multiple bilateral simple renal cysts-no hydronephrosis. 2/24 >> CT abdomen/pelvis: No evidence of bowel obstruction, no hydronephrosis 2/24>> MRI right foot: Osteo throughout the second through fifth toes.   Antimicrobial therapy: Meropenem: 2/23>> 2/28 Clindamycin: 2/23>> 2/26 Vancomycin: 2/23>> 2/25  Microbiology data: 2/23>> blood culture: No growth 2/23>> urine culture: No growth 2/24>> wound stomach: MRSA  Procedures : None  Consults: ID Nephrology Orthopedics  DVT Prophylaxis : heparin injection 5,000 Units Start: 03/05/20 0600   Subjective: Lying comfortably in bed-denies any chest pain or shortness of breath.   Assessment/Plan: AKI on CKD stage IV: AKI likely ischemic ATN in the setting of high ostomy output and hypotension.  Creatinine has improved with supportive care-creatinine now close to baseline.   Right foot gangrene with underlying osteomyelitis: ID/orthopedics following-scheduled for transmetatarsal amputation.  Hypotension: Resolved-initially on stress dose hydrocortisone-a.m. cortisol level appropriate-not thought to have  adrenal insufficiency-has been resumed on home dosing of Florinef.  Hypomagnesemia: Replete and recheck  Hyponatremia: Due to dehydration-resolved with IV fluids.  Normocytic anemia: Secondary to AKI/CKD/chronic wounds-transfuse if hemoglobin<7.  No evidence of GI bleeding.  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    High output ostomy: Starting Lomotil-if needed-can add   DM-2: CBG stable-continue SSI  Recent Labs    03/08/20 2016 03/08/20 2250 03/11/20 1146  GLUCAP 134* 137* 81   Severe protein calorie malnutrition: PEG tube in place-continue feedings.  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-AKI requiring CRRT-cardiac arrest-pericardial effusion requiring pericardial drain placement-mesenteric ischemia requiring small bowel resection-s/p PEG tube placement-s/p chronic indwelling Foley catheter  Nutrition Problem: Nutrition Problem: Severe Malnutrition Etiology: chronic illness (Open abdominal wound s/p small bowel resection) Signs/Symptoms: severe muscle depletion,severe fat depletion,percent weight loss Percent weight loss: 33 % Interventions: Nepro shake,MVI,Tube feeding  Diet: Diet Order            Diet NPO time specified Except for: Sips with Meds  Diet effective 0500                  Code Status: Full code   Family Communication: Will reach out to family over the next few days  Disposition Plan: Status is: Inpatient  Remains inpatient appropriate because:Inpatient level of care appropriate due to severity of illness   Dispo: The patient is from: SNF              Anticipated d/c is to: SNF              Patient currently is not medically stable to d/c.  Difficult to place patient No   Barriers to Discharge: Right foot gangrene-TMA scheduled for  3/2  Antimicrobial agents: Anti-infectives (From admission, onward)   Start     Dose/Rate Route Frequency Ordered Stop   03/11/20 0800  ceFAZolin (ANCEF) IVPB 2g/100 mL premix        2 g 200 mL/hr over 30 Minutes Intravenous To Short Stay 03/11/20 0710 03/12/20 0800   03/09/20 1800  vancomycin (VANCOREADY) IVPB 1000 mg/200 mL  Status:  Discontinued        1,000 mg 200 mL/hr over 60 Minutes Intravenous  Once 03/09/20 1015 03/09/20 1318   03/08/20 1400  meropenem (MERREM) 1 g in sodium chloride 0.9 % 100 mL IVPB  Status:  Discontinued        1 g 200 mL/hr over 30 Minutes Intravenous Every 12 hours 03/08/20 0922 03/09/20 1318   03/07/20 0315  vancomycin (VANCOREADY) IVPB 1000 mg/200 mL        1,000 mg 200 mL/hr over 60 Minutes Intravenous  Once 03/07/20 0223 03/07/20 0449   03/06/20 0000  vancomycin variable dose per unstable renal function (pharmacist dosing)  Status:  Discontinued         Does not apply See admin instructions 03/05/20 0926 03/09/20 1318   03/05/20 0300  meropenem (MERREM) 1 g in sodium chloride 0.9 % 100 mL IVPB  Status:  Discontinued        1 g 200 mL/hr over 30 Minutes Intravenous Every 24 hours 03/05/20 0231 03/08/20 0922   03/05/20 0245  vancomycin (VANCOREADY) IVPB 1250 mg/250 mL        1,250 mg 166.7 mL/hr over 90 Minutes Intravenous  Once 03/05/20 0231 03/05/20 0525   03/05/20 0230  clindamycin (CLEOCIN) IVPB 600 mg        600 mg 100 mL/hr over 30 Minutes Intravenous Every 8 hours 03/05/20 0223 03/07/20 2255       Time spent: 25 minutes-Greater than 50% of this time was spent in counseling, explanation of diagnosis, planning of further management, and coordination of care.  MEDICATIONS: Scheduled Meds: . [MAR Hold] atorvastatin  40 mg Oral Daily  . [MAR Hold] Chlorhexidine Gluconate Cloth  6 each Topical Daily  . [MAR Hold] darbepoetin (ARANESP) injection - NON-DIALYSIS  40 mcg Subcutaneous Q Fri-1800  . [MAR Hold] feeding supplement (NEPRO CARB STEADY)   237 mL Oral BID BM  . [MAR Hold] feeding supplement (PROSource TF)  45 mL Per Tube BID  . [MAR Hold] fludrocortisone  0.1 mg Oral Daily  . [MAR Hold] gabapentin  200 mg Oral QHS  . [MAR Hold] heparin  5,000 Units Subcutaneous Q8H  . [MAR Hold] insulin aspart  0-9 Units Subcutaneous TID AC & HS  . [MAR Hold] liver oil-zinc oxide   Topical BID  . [MAR Hold] melatonin  5 mg Oral QHS  . [MAR Hold] multivitamin  1 tablet Oral QHS  . [MAR Hold] mupirocin ointment  1 application Nasal BID  . [MAR Hold] pantoprazole  20 mg Oral Daily  . [MAR Hold] sodium bicarbonate  1,300 mg Oral BID   Continuous Infusions: . sodium chloride 10 mL/hr at 03/11/20 1239  .  ceFAZolin (ANCEF) IV    . feeding supplement (OSMOLITE 1.5 CAL) Stopped (03/11/20 0500)   PRN Meds:.[MAR Hold] acetaminophen **OR** [MAR Hold] acetaminophen, [MAR Hold] calcium carbonate, [MAR Hold] fentaNYL (SUBLIMAZE) injection, [MAR Hold] hydrOXYzine, [MAR Hold] ipratropium-albuterol, [MAR Hold] ondansetron **OR** [MAR Hold] ondansetron (ZOFRAN) IV   PHYSICAL EXAM: Vital signs:  Vitals:   03/10/20 2342 03/11/20 0358 03/11/20 0727 03/11/20 1233  BP: 106/70 120/67 129/66   Pulse: 82 75 64   Resp: '16 16 18   '$ Temp: 98.3 F (36.8 C) 98.3 F (36.8 C) 98.7 F (37.1 C)   TempSrc: Axillary Axillary Axillary   SpO2: 100% 100% 100%   Weight:    53.7 kg  Height:    '5\' 11"'$  (1.803 m)   Filed Weights   03/09/20 0400 03/10/20 0448 03/11/20 1233  Weight: 53.7 kg 53.7 kg 53.7 kg   Body mass index is 16.51 kg/m.   Gen Exam:Alert awake-not in any distress HEENT:atraumatic, normocephalic Chest: B/L clear to auscultation anteriorly CVS:S1S2 regular Abdomen:soft non tender, non distended Extremities: Right foot toe with numerous gangrene-left AKA Neurology: Non focal Skin: no rash  I have personally reviewed following labs and imaging studies  LABORATORY DATA: CBC: Recent Labs  Lab 03/05/20 0420 03/06/20 0141 03/07/20 0130  03/08/20 0315 03/09/20 0057 03/10/20 0059 03/11/20 0135  WBC 11.0*   < > 11.0* 10.1 12.0* 11.2* 11.0*  NEUTROABS 8.9*  --   --   --   --   --   --   HGB 7.3*   < > 7.9* 7.7* 8.3* 8.3* 7.7*  HCT 23.4*   < > 24.8* 23.8* 26.2* 26.8* 24.3*  MCV 92.9   < > 89.5 90.2 88.8 91.5 91.7  PLT 233   < > 220 230 273 272 259   < > = values in this interval not displayed.    Basic Metabolic Panel: Recent Labs  Lab 03/06/20 0141 03/07/20 0130 03/08/20 0315 03/09/20 0057 03/10/20 0059 03/11/20 0135  NA 136 133* 139 136 137 137  K 5.3* 4.4 3.4* 3.0* 3.9 3.8  CL 101 106 114* 109 108 109  CO2 20* 18* 15* 19* 22 21*  GLUCOSE 145* 153* 172* 129* 108* 98  BUN 80* 71* 54* 50* 45* 41*  CREATININE 7.44* 5.27* 3.34* 2.59* 2.27* 2.23*  CALCIUM 9.2 8.7* 7.4* 8.2* 8.5* 8.6*  MG 1.9 1.6* 1.1* 2.0 1.7 1.5*  PHOS 6.6* 5.1*  --  1.8* 2.1* 2.8    GFR: Estimated Creatinine Clearance: 26.1 mL/min (A) (by C-G formula based on SCr of 2.23 mg/dL (H)).  Liver Function Tests: Recent Labs  Lab 03/05/20 0420 03/06/20 0141 03/09/20 0057 03/10/20 0059 03/11/20 0135  AST  --   --  '15 21 25  '$ ALT  --   --  '18 26 30  '$ ALKPHOS  --   --  53 51 40  BILITOT  --   --  0.7 0.7 1.0  PROT  --   --  5.6* 5.3* 5.1*  ALBUMIN 2.4* 2.1* 2.0* 2.0* 2.1*   No results for input(s): LIPASE, AMYLASE in the last 168 hours. No results for input(s): AMMONIA in the last 168 hours.  Coagulation Profile: Recent Labs  Lab 03/05/20 0420  INR 1.2    Cardiac Enzymes: Recent Labs  Lab 03/04/20 1954 03/06/20 0141  CKTOTAL 26* 16*    BNP (last 3 results) No results for input(s): PROBNP in the last 8760 hours.  Lipid Profile: No results for input(s): CHOL, HDL, LDLCALC, TRIG, CHOLHDL, LDLDIRECT in the last 72 hours.  Thyroid Function Tests: No results for input(s): TSH, T4TOTAL, FREET4, T3FREE, THYROIDAB in the last 72 hours.  Anemia Panel: No results for input(s): VITAMINB12, FOLATE, FERRITIN, TIBC, IRON, RETICCTPCT in  the last 72 hours.  Urine analysis:    Component Value Date/Time  COLORURINE AMBER (A) 03/04/2020 1911   APPEARANCEUR CLOUDY (A) 03/04/2020 1911   LABSPEC 1.018 03/04/2020 1911   PHURINE 5.0 03/04/2020 1911   GLUCOSEU NEGATIVE 03/04/2020 1911   HGBUR MODERATE (A) 03/04/2020 1911   BILIRUBINUR NEGATIVE 03/04/2020 1911   KETONESUR NEGATIVE 03/04/2020 1911   PROTEINUR 30 (A) 03/04/2020 1911   NITRITE NEGATIVE 03/04/2020 1911   LEUKOCYTESUR LARGE (A) 03/04/2020 1911    Sepsis Labs: Lactic Acid, Venous    Component Value Date/Time   LATICACIDVEN 1.1 03/04/2020 2039    MICROBIOLOGY: Recent Results (from the past 240 hour(s))  Blood culture (routine x 2)     Status: None   Collection Time: 03/04/20  7:05 PM   Specimen: BLOOD  Result Value Ref Range Status   Specimen Description BLOOD SITE NOT SPECIFIED  Final   Special Requests   Final    BOTTLES DRAWN AEROBIC AND ANAEROBIC Blood Culture results may not be optimal due to an inadequate volume of blood received in culture bottles   Culture   Final    NO GROWTH 5 DAYS Performed at [facility], [place] [address] [place], [place] [address]    Report Status 03/09/2020 FINAL  Final  Blood culture (routine x 2)     Status: None   Collection Time: 03/04/20  7:10 PM   Specimen: BLOOD  Result Value Ref Range Status   Specimen Description BLOOD BLOOD LEFT FOREARM  Final   Special Requests   Final    BOTTLES DRAWN AEROBIC AND ANAEROBIC Blood Culture results may not be optimal due to an inadequate volume of blood received in culture bottles   Culture   Final    NO GROWTH 5 DAYS Performed at [facility], [place] [address] [place], [place] [address]    Report Status 03/09/2020 FINAL  Final  SARS CORONAVIRUS 2 (TAT 6-24 HRS) Nasopharyngeal Urine, Catheterized     Status: None   Collection Time: 03/04/20  7:54 PM   Specimen: Urine, Catheterized; Nasopharyngeal  Result Value Ref Range Status   SARS Coronavirus 2 NEGATIVE  NEGATIVE Final    Comment: (NOTE) SARS-CoV-2 target nucleic acids are NOT DETECTED.  The SARS-CoV-2 RNA is generally detectable in upper and lower respiratory specimens during the acute phase of infection. Negative results do not preclude SARS-CoV-2 infection, do not rule out co-infections with other pathogens, and should not be used as the sole basis for treatment or other patient management decisions. Negative results must be combined with clinical observations, patient history, and epidemiological information. The expected result is Negative.  Fact Sheet for Patients: SugarRoll.be  Fact Sheet for Healthcare Providers: https://www.woods-mathews.com/  This test is not yet approved or cleared by the [place] FDA and  has been authorized for detection and/or diagnosis of SARS-CoV-2 by FDA under an Emergency Use Authorization (EUA). This EUA will remain  in effect (meaning this test can be used) for the duration of the COVID-19 declaration under Se ction 564(b)(1) of the Act, 21 U.S.C. section 360bbb-3(b)(1), unless the authorization is terminated or revoked sooner.  Performed at [facility], [place] [address], [place], [place] [address]   Urine culture     Status: None   Collection Time: 03/04/20  9:02 PM   Specimen: Urine, Random  Result Value Ref Range Status   Specimen Description URINE, RANDOM  Final   Special Requests NONE  Final   Culture   Final    NO GROWTH Performed at [facility], [address], [place], [place] [address]    Report Status 03/06/2020 FINAL  Final  Aerobic Culture w Gram Stain (superficial specimen)     Status: None   Collection Time: 03/05/20  3:03 AM   Specimen: Wound  Result Value Ref Range Status   Specimen Description WOUND STOMACH  Final   Special Requests NONE  Final   Gram Stain   Final    RARE WBC PRESENT, PREDOMINANTLY PMN MODERATE GRAM POSITIVE COCCI Performed at [facility], [place] [address], [place], [place] [address]    Culture   Final    ABUNDANT METHICILLIN RESISTANT STAPHYLOCOCCUS AUREUS   Report Status 03/07/2020 FINAL  Final   Organism ID, Bacteria METHICILLIN RESISTANT STAPHYLOCOCCUS AUREUS  Final      Susceptibility   Methicillin resistant staphylococcus aureus - MIC*    CIPROFLOXACIN >=8 RESISTANT Resistant     ERYTHROMYCIN >=8 RESISTANT Resistant     GENTAMICIN <=0.5 SENSITIVE Sensitive     OXACILLIN >=4 RESISTANT Resistant     TETRACYCLINE <=1 SENSITIVE Sensitive     VANCOMYCIN 1 SENSITIVE Sensitive     TRIMETH/SULFA <=10 SENSITIVE Sensitive     CLINDAMYCIN <=0.25 SENSITIVE Sensitive     RIFAMPIN <=0.5 SENSITIVE Sensitive     Inducible Clindamycin NEGATIVE Sensitive     * ABUNDANT METHICILLIN RESISTANT STAPHYLOCOCCUS AUREUS  Surgical PCR screen     Status: Abnormal   Collection Time: 03/11/20  7:25 AM   Specimen: Nasal Mucosa; Nasal Swab  Result Value Ref Range Status   MRSA, PCR POSITIVE (A) NEGATIVE Final    Comment: RESULT CALLED TO, READ BACK BY AND VERIFIED WITH: RN G.SCALCL ON 03/11/2020 AT 0954 BY E.PARRISH    Staphylococcus aureus POSITIVE (A) NEGATIVE Final    Comment: (NOTE) The Xpert SA Assay (FDA approved for NASAL specimens in patients 69 years of age and older), is one component of a comprehensive surveillance program. It is not intended to diagnose infection nor to guide or monitor treatment. Performed at [facility], [place] [address], [place], [place] [address]     RADIOLOGY STUDIES/RESULTS: No results found.   LOS: 7 days   Oren Binet, MD  [facility]    To contact the attending provider between 7A-7P or the covering provider during after hours 7P-7A, please log into the web site [email or web address] and access using universal [place] password for that web site. If you do not have the password, please call the hospital operator.  03/11/2020, 1:29 PM

## 2020-03-11 NOTE — Interval H&P Note (Signed)
History and Physical Interval Note:  03/11/2020 6:46 AM  Curtis Solis  has presented today for surgery, with the diagnosis of Right Foot Osteomyelitis.  The various methods of treatment have been discussed with the patient and family. After consideration of risks, benefits and other options for treatment, the patient has consented to  Procedure(s): RIGHT TRANSMETATARSAL AMPUTATION (Right) as a surgical intervention.  The patient's history has been reviewed, patient examined, no change in status, stable for surgery.  I have reviewed the patient's chart and labs.  Questions were answered to the patient's satisfaction.     Newt Minion

## 2020-03-11 NOTE — Anesthesia Procedure Notes (Signed)
Anesthesia Regional Block: Popliteal block   Pre-Anesthetic Checklist: ,, timeout performed, Correct Patient, Correct Site, Correct Laterality, Correct Procedure, Correct Position, site marked, Risks and benefits discussed,  Surgical consent,  Pre-op evaluation,  At surgeon's request and post-op pain management  Laterality: Right  Prep: Maximum Sterile Barrier Precautions used, chloraprep       Needles:  Injection technique: Single-shot  Needle Type: Echogenic Stimulator Needle     Needle Length: 9cm  Needle Gauge: 22     Additional Needles:   Procedures:,,,, ultrasound used (permanent image in chart),,,,  Narrative:  Start time: 03/11/2020 12:20 PM End time: 03/11/2020 12:25 PM Injection made incrementally with aspirations every 5 mL.  Performed by: Personally  Anesthesiologist: Pervis Hocking, DO  Additional Notes: Monitors applied. No increased pain on injection. No increased resistance to injection. Injection made in 5cc increments. Good needle visualization. Patient tolerated procedure well.

## 2020-03-11 NOTE — Progress Notes (Signed)
Subjective:  No new complaints when I saw him this morning   Antibiotics:  Anti-infectives (From admission, onward)   Start     Dose/Rate Route Frequency Ordered Stop   03/11/20 0800  ceFAZolin (ANCEF) IVPB 2g/100 mL premix        2 g 200 mL/hr over 30 Minutes Intravenous To Short Stay 03/11/20 0710 03/11/20 1408   03/09/20 1800  vancomycin (VANCOREADY) IVPB 1000 mg/200 mL  Status:  Discontinued        1,000 mg 200 mL/hr over 60 Minutes Intravenous  Once 03/09/20 1015 03/09/20 1318   03/08/20 1400  meropenem (MERREM) 1 g in sodium chloride 0.9 % 100 mL IVPB  Status:  Discontinued        1 g 200 mL/hr over 30 Minutes Intravenous Every 12 hours 03/08/20 0922 03/09/20 1318   03/07/20 0315  vancomycin (VANCOREADY) IVPB 1000 mg/200 mL        1,000 mg 200 mL/hr over 60 Minutes Intravenous  Once 03/07/20 0223 03/07/20 0449   03/06/20 0000  vancomycin variable dose per unstable renal function (pharmacist dosing)  Status:  Discontinued         Does not apply See admin instructions 03/05/20 0926 03/09/20 1318   03/05/20 0300  meropenem (MERREM) 1 g in sodium chloride 0.9 % 100 mL IVPB  Status:  Discontinued        1 g 200 mL/hr over 30 Minutes Intravenous Every 24 hours 03/05/20 0231 03/08/20 0922   03/05/20 0245  vancomycin (VANCOREADY) IVPB 1250 mg/250 mL        1,250 mg 166.7 mL/hr over 90 Minutes Intravenous  Once 03/05/20 0231 03/05/20 0525   03/05/20 0230  clindamycin (CLEOCIN) IVPB 600 mg        600 mg 100 mL/hr over 30 Minutes Intravenous Every 8 hours 03/05/20 0223 03/07/20 2255      Medications: Scheduled Meds: . atorvastatin  40 mg Oral Daily  . Chlorhexidine Gluconate Cloth  6 each Topical Daily  . darbepoetin (ARANESP) injection - NON-DIALYSIS  40 mcg Subcutaneous Q Fri-1800  . diphenoxylate-atropine  1 tablet Oral QID  . docusate sodium  100 mg Oral BID  . feeding supplement (NEPRO CARB STEADY)  237 mL Oral BID BM  . feeding supplement (PROSource TF)  45 mL  Per Tube BID  . fludrocortisone  0.1 mg Oral Daily  . gabapentin  200 mg Oral QHS  . heparin  5,000 Units Subcutaneous Q8H  . insulin aspart  0-9 Units Subcutaneous TID AC & HS  . liver oil-zinc oxide   Topical BID  . melatonin  5 mg Oral QHS  . multivitamin  1 tablet Oral QHS  . mupirocin ointment  1 application Nasal BID  . pantoprazole  20 mg Oral Daily  . sodium bicarbonate  1,300 mg Oral BID   Continuous Infusions: . sodium chloride 10 mL/hr at 03/11/20 1401  . sodium chloride 75 mL/hr at 03/11/20 1530  . feeding supplement (OSMOLITE 1.5 CAL) Stopped (03/11/20 0500)  . magnesium sulfate bolus IVPB     PRN Meds:.acetaminophen **OR** acetaminophen, calcium carbonate, fentaNYL (SUBLIMAZE) injection, hydrOXYzine, ipratropium-albuterol, metoCLOPramide **OR** metoCLOPramide (REGLAN) injection, ondansetron **OR** ondansetron (ZOFRAN) IV    Objective: Weight change:   Intake/Output Summary (Last 24 hours) at 03/11/2020 1536 Last data filed at 03/11/2020 1530 Gross per 24 hour  Intake 230 ml  Output 2527 ml  Net -2297 ml   Blood pressure (!) 135/59, pulse 60,  temperature 97.8 F (36.6 C), temperature source Oral, resp. rate 17, height '5\' 11"'$  (1.803 m), weight 53.7 kg, SpO2 100 %. Temp:  [97.1 F (36.2 C)-98.7 F (37.1 C)] 97.8 F (36.6 C) (03/02 1526) Pulse Rate:  [52-86] 60 (03/02 1526) Resp:  [15-25] 17 (03/02 1526) BP: (106-135)/(59-78) 135/59 (03/02 1526) SpO2:  [92 %-100 %] 100 % (03/02 1526) Weight:  [53.7 kg] 53.7 kg (03/02 1233)  Physical Exam: Physical Exam HENT:     Head: Normocephalic and atraumatic.  Pulmonary:     Effort: Pulmonary effort is normal. No respiratory distress.  Abdominal:     General: There is no distension.  Neurological:     General: No focal deficit present.     Mental Status: He is alert.  Psychiatric:        Mood and Affect: Mood is anxious.        Speech: Speech normal.        Behavior: Behavior normal. Behavior is cooperative.         Thought Content: Thought content normal.     Gangrenous changes still present in foot  CBC:    BMET Recent Labs    03/10/20 0059 03/11/20 0135  NA 137 137  K 3.9 3.8  CL 108 109  CO2 22 21*  GLUCOSE 108* 98  BUN 45* 41*  CREATININE 2.27* 2.23*  CALCIUM 8.5* 8.6*     Liver Panel  Recent Labs    03/10/20 0059 03/11/20 0135  PROT 5.3* 5.1*  ALBUMIN 2.0* 2.1*  AST 21 25  ALT 26 30  ALKPHOS 51 40  BILITOT 0.7 1.0       Sedimentation Rate Recent Labs    03/10/20 0059  ESRSEDRATE 48*   C-Reactive Protein Recent Labs    03/10/20 0059  CRP 0.8    Micro Results: Recent Results (from the past 720 hour(s))  Blood culture (routine x 2)     Status: None   Collection Time: 03/04/20  7:05 PM   Specimen: BLOOD  Result Value Ref Range Status   Specimen Description BLOOD SITE NOT SPECIFIED  Final   Special Requests   Final    BOTTLES DRAWN AEROBIC AND ANAEROBIC Blood Culture results may not be optimal due to an inadequate volume of blood received in culture bottles   Culture   Final    NO GROWTH 5 DAYS Performed at [facility], [place] [address], [place], [place] [address]    Report Status 03/09/2020 FINAL  Final  Blood culture (routine x 2)     Status: None   Collection Time: 03/04/20  7:10 PM   Specimen: BLOOD  Result Value Ref Range Status   Specimen Description BLOOD BLOOD LEFT FOREARM  Final   Special Requests   Final    BOTTLES DRAWN AEROBIC AND ANAEROBIC Blood Culture results may not be optimal due to an inadequate volume of blood received in culture bottles   Culture   Final    NO GROWTH 5 DAYS Performed at [facility], [place] [address], [place], [place] [address]    Report Status 03/09/2020 FINAL  Final  SARS CORONAVIRUS 2 (TAT 6-24 HRS) Nasopharyngeal Urine, Catheterized     Status: None   Collection Time: 03/04/20  7:54 PM   Specimen: Urine, Catheterized; Nasopharyngeal  Result Value Ref Range Status   SARS Coronavirus 2 NEGATIVE  NEGATIVE Final    Comment: (NOTE) SARS-CoV-2 target nucleic acids are NOT DETECTED.  The SARS-CoV-2 RNA  is generally detectable in upper and lower respiratory specimens during the acute phase of infection. Negative results do not preclude SARS-CoV-2 infection, do not rule out co-infections with other pathogens, and should not be used as the sole basis for treatment or other patient management decisions. Negative results must be combined with clinical observations, patient history, and epidemiological information. The expected result is Negative.  Fact Sheet for Patients: SugarRoll.be  Fact Sheet for Healthcare Providers: https://www.woods-mathews.com/  This test is not yet approved or cleared by the [place] FDA and  has been authorized for detection and/or diagnosis of SARS-CoV-2 by FDA under an Emergency Use Authorization (EUA). This EUA will remain  in effect (meaning this test can be used) for the duration of the COVID-19 declaration under Se ction 564(b)(1) of the Act, 21 U.S.C. section 360bbb-3(b)(1), unless the authorization is terminated or revoked sooner.  Performed at [facility], [place] [address], [place], [place] [address]   Urine culture     Status: None   Collection Time: 03/04/20  9:02 PM   Specimen: Urine, Random  Result Value Ref Range Status   Specimen Description URINE, RANDOM  Final   Special Requests NONE  Final   Culture   Final    NO GROWTH Performed at [facility], [place] [address], [place], [place] [address]    Report Status 03/06/2020 FINAL  Final  Aerobic Culture w Gram Stain (superficial specimen)     Status: None   Collection Time: 03/05/20  3:03 AM   Specimen: Wound  Result Value Ref Range Status   Specimen Description WOUND STOMACH  Final   Special Requests NONE  Final   Gram Stain   Final    RARE WBC PRESENT, PREDOMINANTLY PMN MODERATE GRAM POSITIVE COCCI Performed at [facility], [place] [address], [place], [place] [address]    Culture   Final    ABUNDANT METHICILLIN RESISTANT STAPHYLOCOCCUS AUREUS   Report Status 03/07/2020 FINAL  Final   Organism ID, Bacteria METHICILLIN RESISTANT STAPHYLOCOCCUS AUREUS  Final      Susceptibility   Methicillin resistant staphylococcus aureus - MIC*    CIPROFLOXACIN >=8 RESISTANT Resistant     ERYTHROMYCIN >=8 RESISTANT Resistant     GENTAMICIN <=0.5 SENSITIVE Sensitive     OXACILLIN >=4 RESISTANT Resistant     TETRACYCLINE <=1 SENSITIVE Sensitive     VANCOMYCIN 1 SENSITIVE Sensitive     TRIMETH/SULFA <=10 SENSITIVE Sensitive     CLINDAMYCIN <=0.25 SENSITIVE Sensitive     RIFAMPIN <=0.5 SENSITIVE Sensitive     Inducible Clindamycin NEGATIVE Sensitive     * ABUNDANT METHICILLIN RESISTANT STAPHYLOCOCCUS AUREUS  Surgical PCR screen     Status: Abnormal   Collection Time: 03/11/20  7:25 AM   Specimen: Nasal Mucosa; Nasal Swab  Result Value Ref Range Status   MRSA, PCR POSITIVE (A) NEGATIVE Final    Comment: RESULT CALLED TO, READ BACK BY AND VERIFIED WITH: RN G.SCALCL ON 03/11/2020 AT 0954 BY E.PARRISH    Staphylococcus aureus POSITIVE (A) NEGATIVE Final    Comment: (NOTE) The Xpert SA Assay (FDA approved for NASAL specimens in patients 55 years of age and older), is one component of a comprehensive surveillance program. It is not intended to diagnose infection nor to guide or monitor treatment. Performed at [facility], [place] [address], [place], [place] [address]     Studies/Results: No results found.    Assessment/Plan:  INTERVAL HISTORY: pt is now  sp TMT amputation, with healthy bleeding and no evidence of infection per Dr. 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  Patient is status post transmetatarsal amputation  No cultures sent but pt grew MRSA from abdominal wound  I would feel more comfortable if he has some "mop up antibiotics" post surgery for roughly 2 weeks  We will plan on 2 weeks of zyvox at DC  Renal failure: dramatically improved   Curtis Solis has an appointment on 03/24/2019 at 68 AM with [facility] is located in the [facility] at  [place] in [place].  [address], which is located to the left of the elevators.  Phone: [phone]  Fax: [phone]  [email or web address]  He should arrive 15 minutes piror to his appointment.  I will sign off for now  Please call with further questions.       LOS: 7 days   Alcide Evener 03/11/2020, 3:36 PM

## 2020-03-11 NOTE — Anesthesia Postprocedure Evaluation (Signed)
Anesthesia Post Note  Patient: Curtis Solis  Procedure(s) Performed: RIGHT TRANSMETATARSAL AMPUTATION (Right Foot)     Patient location during evaluation: PACU Anesthesia Type: Regional and MAC Level of consciousness: awake and alert and oriented Pain management: pain level controlled Vital Signs Assessment: post-procedure vital signs reviewed and stable Respiratory status: spontaneous breathing, nonlabored ventilation and respiratory function stable Cardiovascular status: stable and blood pressure returned to baseline Postop Assessment: no apparent nausea or vomiting Anesthetic complications: no   No complications documented.  Last Vitals:  Vitals:   03/11/20 0727 03/11/20 1435  BP: 129/66 116/63  Pulse: 64 (!) 56  Resp: 18 (!) 25  Temp: 37.1 C (!) 36.2 C  SpO2: 100% 100%    Last Pain:  Vitals:   03/11/20 1435  TempSrc:   PainSc: 0-No pain                 Naylin Burkle A.

## 2020-03-11 NOTE — Transfer of Care (Signed)
Immediate Anesthesia Transfer of Care Note  Patient: Curtis Solis   Procedure(s) Performed: RIGHT TRANSMETATARSAL AMPUTATION (Right Foot)  Patient Location: PACU  Anesthesia Type:MAC combined with regional for post-op pain  Level of Consciousness: awake, alert , oriented and patient cooperative  Airway & Oxygen Therapy: Patient Spontanous Breathing and Patient connected to nasal cannula oxygen  Post-op Assessment: Report given to RN, Post -op Vital signs reviewed and stable and Patient moving all extremities  Post vital signs: Reviewed and stable  Last Vitals:  Vitals Value Taken Time  BP 116/63 03/11/20 1434  Temp    Pulse 57 03/11/20 1435  Resp 23 03/11/20 1435  SpO2 100 % 03/11/20 1435  Vitals shown include unvalidated device data.  Last Pain:  Vitals:   03/11/20 0727  TempSrc: Axillary  PainSc: 0-No pain      Patients Stated Pain Goal: 2 (67/59/16 3846)  Complications: No complications documented.

## 2020-03-11 NOTE — Progress Notes (Signed)
Provider paged about patient NPO status after MN due surgery in the afternoon.Patient have Tube feedings running at night.   Per provider. Patient needs to be NPO for at least six hours prior to surgery. Stop tube feedings at 0500 and make patient NPO.   Provider (Dr. Tonie Griffith)

## 2020-03-11 NOTE — Progress Notes (Signed)
Patient going to OR. Alert and oriented x 4; no acute distress noted, no complaints. VS stable. Short Stay was called for report.

## 2020-03-11 NOTE — Anesthesia Preprocedure Evaluation (Addendum)
Anesthesia Evaluation  Patient identified by MRN, date of birth, ID band Patient awake    Reviewed: Allergy & Precautions, NPO status , Patient's Chart, lab work & pertinent test results, reviewed documented beta blocker date and time   Airway Mallampati: II  TM Distance: >3 FB Neck ROM: Full    Dental  (+) Poor Dentition, Dental Advisory Given, Loose,    Pulmonary neg pulmonary ROS, former smoker,    Pulmonary exam normal breath sounds clear to auscultation       Cardiovascular + Peripheral Vascular Disease  Normal cardiovascular exam+ dysrhythmias Atrial Fibrillation  Rhythm:Regular Rate:Normal     Neuro/Psych negative neurological ROS  negative psych ROS   GI/Hepatic GERD  Medicated and Controlled,(+) Hepatitis -, C  Endo/Other  diabetes, Type 2  Renal/GU CRFRenal diseaseCr 2.23  negative genitourinary   Musculoskeletal Right foot osteo   Abdominal   Peds  Hematology negative hematology ROS (+)   Anesthesia Other Findings  hospitalization [facility] for AAA repair complicated by 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  Reproductive/Obstetrics negative OB ROS                            Anesthesia Physical Anesthesia Plan  ASA: IV  Anesthesia Plan: MAC and Regional   Post-op Pain Management:    Induction:   PONV Risk Score and Plan: Propofol infusion, TIVA and Treatment may vary due to age or medical condition  Airway Management Planned: Natural Airway and Simple Face Mask  Additional Equipment: None  Intra-op Plan:   Post-operative Plan:   Informed Consent: I have reviewed the patients History and Physical, chart, labs and discussed the  procedure including the risks, benefits and alternatives for the proposed anesthesia with the patient or authorized representative who has indicated his/her understanding and acceptance.       Plan Discussed with: CRNA  Anesthesia Plan Comments:         Anesthesia Quick Evaluation

## 2020-03-11 NOTE — Op Note (Signed)
03/11/2020  2:46 PM  PATIENT:  Curtis Solis    PRE-OPERATIVE DIAGNOSIS:  Right Foot Osteomyelitis  POST-OPERATIVE DIAGNOSIS:  Same  PROCEDURE:  RIGHT TRANSMETATARSAL AMPUTATION  SURGEON:  Newt Minion, MD  PHYSICIAN ASSISTANT:None ANESTHESIA:   General  PREOPERATIVE INDICATIONS:  [name] is a  65 y.o. male with a diagnosis of Right Foot Osteomyelitis who failed conservative measures and elected for surgical management.    The risks benefits and alternatives were discussed with the patient preoperatively including but not limited to the risks of infection, bleeding, nerve injury, cardiopulmonary complications, the need for revision surgery, among others, and the patient was willing to proceed.  OPERATIVE IMPLANTS: none  '@ENCIMAGES'$ @  OPERATIVE FINDINGS: Good petechial bleeding at the amputation site no abscess.  OPERATIVE PROCEDURE: Patient was brought the operating room after undergoing a regional anesthetic.  After adequate levels anesthesia were obtained patient's right lower extremity was prepped using DuraPrep draped into a sterile field a timeout was called.  A fishmouth incision was made just proximal to the gangrenous tissue.  This was carried sharply down to bone and an oscillating saw was used to perform a transmetatarsal amputation.  Electrocautery was used for hemostasis the wound was irrigated with normal saline.  The tissue had good petechial bleeding no evidence of infection at the level of amputation.  The incision was closed using 2-0 nylon a Prevena wound VAC was applied this had a good suction fit this was overwrapped with Covan patient was taken the PACU in stable condition.   DISCHARGE PLANNING:  Antibiotic duration: Continue antibiotics for 24 hours.  Weightbearing: Nonweightbearing on the right  Pain medication: Opioid pathway  Dressing care/ Wound VAC: Continue wound VAC for 1 week   Ambulatory devices: Transfers only with weightbearing on the  heel  Discharge to: Anticipate discharge to skilled nursing  Follow-up: In the office 1 week post operative.

## 2020-03-11 NOTE — Progress Notes (Signed)
Patient back from OR. Alert and oriented x 4. No acute distress noted, no complaints. VS stable. Dressing intact, clean, dry on right foot with Prevena wound VAC in place working (125 mmHg continuous).

## 2020-03-11 NOTE — Anesthesia Procedure Notes (Signed)
Anesthesia Regional Block: Adductor canal block   Pre-Anesthetic Checklist: ,, timeout performed, Correct Patient, Correct Site, Correct Laterality, Correct Procedure, Correct Position, site marked, Risks and benefits discussed,  Surgical consent,  Pre-op evaluation,  At surgeon's request and post-op pain management  Laterality: Right  Prep: Maximum Sterile Barrier Precautions used, chloraprep       Needles:  Injection technique: Single-shot  Needle Type: Echogenic Stimulator Needle     Needle Length: 9cm  Needle Gauge: 22     Additional Needles:   Procedures:,,,, ultrasound used (permanent image in chart),,,,  Narrative:  Start time: 03/11/2020 12:25 PM End time: 03/11/2020 12:30 PM Injection made incrementally with aspirations every 5 mL.  Performed by: Personally  Anesthesiologist: Pervis Hocking, DO  Additional Notes: Monitors applied. No increased pain on injection. No increased resistance to injection. Injection made in 5cc increments. Good needle visualization. Patient tolerated procedure well.

## 2020-03-12 ENCOUNTER — Encounter (HOSPITAL_COMMUNITY): Payer: Self-pay | Admitting: Orthopedic Surgery

## 2020-03-12 LAB — CBC
HCT: 26.5 % — ABNORMAL LOW (ref 39.0–52.0)
Hemoglobin: 8.4 g/dL — ABNORMAL LOW (ref 13.0–17.0)
MCH: 29.2 pg (ref 26.0–34.0)
MCHC: 31.7 g/dL (ref 30.0–36.0)
MCV: 92 fL (ref 80.0–100.0)
Platelets: 290 10*3/uL (ref 150–400)
RBC: 2.88 MIL/uL — ABNORMAL LOW (ref 4.22–5.81)
RDW: 17.9 % — ABNORMAL HIGH (ref 11.5–15.5)
WBC: 18.2 10*3/uL — ABNORMAL HIGH (ref 4.0–10.5)
nRBC: 0 % (ref 0.0–0.2)

## 2020-03-12 LAB — MAGNESIUM: Magnesium: 2.5 mg/dL — ABNORMAL HIGH (ref 1.7–2.4)

## 2020-03-12 MED ORDER — OXYCODONE HCL 5 MG PO TABS
5.0000 mg | ORAL_TABLET | ORAL | Status: DC | PRN
  Administered 2020-03-12 – 2020-03-13 (×4): 5 mg via ORAL
  Filled 2020-03-12 (×4): qty 1

## 2020-03-12 MED ORDER — INSULIN ASPART 100 UNIT/ML ~~LOC~~ SOLN
0.0000 [IU] | SUBCUTANEOUS | Status: DC
  Administered 2020-03-12: 2 [IU] via SUBCUTANEOUS
  Administered 2020-03-12: 1 [IU] via SUBCUTANEOUS
  Administered 2020-03-12: 2 [IU] via SUBCUTANEOUS

## 2020-03-12 MED ORDER — TRAMADOL HCL 50 MG PO TABS
50.0000 mg | ORAL_TABLET | Freq: Once | ORAL | Status: AC | PRN
Start: 2020-03-12 — End: 2020-03-12
  Administered 2020-03-12: 50 mg via ORAL
  Filled 2020-03-12: qty 1

## 2020-03-12 NOTE — Progress Notes (Signed)
Patient is postop day 1 status post transmetatarsal amputation.  He is comfortable sitting up in bed.  Wound VAC is functioning with Prevena VAC 0 cc in the canister   Orthopedic standpoint will need follow-up in our office in 1 week

## 2020-03-12 NOTE — Evaluation (Signed)
Physical Therapy Evaluation Patient Details Name: Curtis Solis MRN: [id number] DOB: 03-Jun-1955 Today's Date: 03/12/2020   History of Present Illness  Pt is a 65 y.o. male admitted from [facility] 03/04/20 with AKI, hypotension, chronic urinary retention/open wound of penis, necrosis of R toes. 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    Clinical Impression  Pt presents with an overall decrease in functional mobility secondary to above. PTA, pt receiving rehab at SNF since multiple recent hospital admissions with d/c to SNF. Prior to initial admission (09/2019), pt independent, active, enjoys playing golf and lives with supportive wife. Pt motivated to regain independent and active PLOF; as his medical issues continue to resolve, pt should be able to progress to mostly mod indep at wheelchair-level. Today, pt requiring minA for scooting transfers; assist for ADL tasks. Pt limited by pain, generalized weakness, decreased activity tolerance and impaired balance strategies/postural reactions; at high risk for falls. Pt would benefit from intensive CIR-level therapies to maximize functional mobility and independence prior to d/c home.     Follow Up Recommendations CIR;Supervision for mobility/OOB    Equipment Recommendations  Wheelchair (measurements PT);Wheelchair cushion (measurements PT)    Recommendations for Other Services       Precautions / Restrictions Precautions Precautions: Fall Required Braces or Orthoses: Other Brace Other Brace: R darco shoe for transfers only Restrictions Weight Bearing Restrictions: Yes RLE Weight Bearing: Non weight bearing Other Position/Activity Restrictions: per  Dr. Jess Barters op notes - weight bearing through heel for transfers only      Mobility  Bed Mobility Overal bed mobility: Needs Assistance Bed Mobility: Supine to Sit     Supine to sit: Min assist;HOB elevated     General bed mobility comments: Increased time and effort, minA for HHA to elevate trunk    Transfers Overall transfer level: Needs assistance Equipment used: None Transfers: Lateral/Scoot Transfers          Lateral/Scoot Transfers: Min assist General transfer comment: Performed lateral scoot from bed to drop arm recliner with darco shoe donned and minA; increased time and effort; assist for set-up  Ambulation/Gait                Stairs            Wheelchair Mobility    Modified Rankin (Stroke Patients Only)       Balance Overall balance assessment: Mild deficits observed, not formally tested                                           Pertinent Vitals/Pain Pain Assessment: Faces Faces Pain Scale: Hurts even more Pain Location: R foot/lower leg Pain Descriptors / Indicators: Discomfort;Grimacing;Guarding Pain Intervention(s): Limited activity within patient's tolerance;Repositioned;Patient requesting pain meds-RN notified    Home Living Family/patient expects to be discharged to:: Private residence Living Arrangements: Spouse/significant other Available Help at Discharge: Family Type of Home: House       Home Layout: Two level;Able to live on main level with bedroom/bathroom   Additional Comments: Pt has not been home since admission 09/2019. Pt plans to eventually return home with wife (live in [place]) as soon as  medically cleared and able to manage mod indep to minA; reports son working to have ramp installed    Prior Function Level of Independence: Needs assistance   Gait / Transfers Assistance Needed: Since at SNF, pt reports working with rehab staff; performs lateral scoots or slide board transfers to w/c  daily  ADL's / [place] Needed: Pt reports performing upper body ADL tasks with set-up to minA; requires assist for lower body ADLs        Hand Dominance        Extremity/Trunk Assessment   Upper Extremity Assessment Upper Extremity Assessment: Generalized weakness    Lower Extremity Assessment Lower Extremity Assessment: RLE deficits/detail;LLE deficits/detail RLE Deficits / Details: s/p R transmet amputation 03/11/20; pt with prior toe amputation; reports decreased sensation/numbness in lower leg; hip strength <3/5 (noted quad lag with partial range SLR), knee flex/ext at least 3/5; pt unable to mobilize ankle LLE Deficits / Details: s/p prior L AKA; hip flex/ext/abd/add at least 3/5    Cervical / Trunk Assessment Cervical / Trunk Assessment: Normal  Communication   Communication: No difficulties  Cognition Arousal/Alertness: Awake/alert Behavior During Therapy: WFL for tasks assessed/performed Overall Cognitive Status: Within Functional Limits for tasks assessed                                 General Comments: WFL for simple tasks; not formally assessed      General Comments General comments (skin integrity, edema, etc.): C/o dizziness with sitting; post-transfer BP 133/77, HR 89    Exercises General Exercises - Lower Extremity Long Arc Quad: AAROM;Right;Supine Heel Slides: AROM;Right;Seated Hip ABduction/ADduction: AROM;Left;Supine Hip Flexion/Marching: AROM;Left;Supine   Assessment/Plan    PT Assessment Patient needs continued PT services  PT Problem List Decreased strength;Decreased range of motion;Decreased activity tolerance;Decreased balance;Decreased mobility;Decreased knowledge of use of DME;Decreased knowledge of precautions;Impaired sensation;Decreased skin integrity       PT Treatment Interventions DME instruction;Functional mobility training;Therapeutic activities;Therapeutic exercise;Balance training;Patient/family  education;Wheelchair mobility training    PT Goals (Current goals can be found in the Care Plan section)  Acute Rehab PT Goals Patient Stated Goal: "I can't wait to go back home" PT Goal Formulation: With patient Time For Goal Achievement: 03/26/20 Potential to Achieve Goals: Good    Frequency Min 3X/week   Barriers to discharge        Co-evaluation PT/OT/SLP Co-Evaluation/Treatment: Yes Reason for Co-Treatment: Complexity of the patient's impairments (multi-system involvement);For patient/therapist safety;To address functional/ADL transfers PT goals addressed during session: Mobility/safety with mobility;Balance         AM-PAC PT "6 Clicks" Mobility  Outcome Measure Help needed turning from your back to your side while in a flat bed without using bedrails?: A Little Help needed moving from lying on your back to sitting on the side of a flat bed without using bedrails?: A Little Help needed moving to and from a bed to a chair (including a wheelchair)?: A Little Help needed standing up from a chair using your arms (e.g., wheelchair or bedside chair)?: A Lot Help needed to walk in hospital room?: Total Help needed climbing 3-5 steps with a railing? : Total 6 Click Score: 13    End of Session   Activity Tolerance: Patient tolerated treatment well Patient left: in chair;with call bell/phone within reach;with chair alarm set Nurse Communication: Mobility status PT Visit Diagnosis: Other abnormalities of gait and mobility (R26.89);Muscle weakness (generalized) (M62.81);Pain Pain - Right/Left: Right Pain -  part of body: Leg;Ankle and joints of foot    Time: [id number] PT Time Calculation (min) (ACUTE ONLY): 40 min   Charges:   PT Evaluation $PT Eval Moderate Complexity: 1 Mod PT Treatments $Therapeutic Exercise: 8-22 mins   Mabeline Caras, PT, DPT Acute Rehabilitation Services  Pager [phone] Office [place] 03/12/2020, 12:36 PM

## 2020-03-12 NOTE — Evaluation (Signed)
Occupational Therapy Evaluation Patient Details Name: Curtis Solis MRN: [id number] DOB: 12/26/1955 Today's Date: 03/12/2020    History of Present Illness Pt is a 65 y.o. male admitted from [facility] 03/04/20 with AKI, hypotension, chronic urinary retention/open wound of penis, necrosis of R toes. 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   Clinical Impression   Pt admitted with above. He demonstrates the below listed deficits and will benefit from continued OT to maximize safety and independence with BADLs.  Pt presents to OT with generalized weakness, decreased activity tolerance, increased pain, impaired balance. He currently requires set up assist - total A for ADLs and min A for level, lateral scoot transfers.  He was been at SNF since last fall, however, prior to the surgery for his AAA repair he was fully independent and living with his wife in [place] (his sons work in [place] and wanted him in a facility close to them).  His plan is to return home with his wife, who he reports can assist as needed.   He is incredibly motivated and as his medical issues continue to resolve, he should be able to progress to mod I - occasional min A level with most ADLs  At w/c level.   Due to this, recommend CIR consult with hope that he may be able to transition straight home with wife after CIR stay.        Follow Up Recommendations  CIR    Equipment Recommendations  None recommended by OT    Recommendations for Other Services Rehab consult     Precautions / Restrictions Precautions Precautions: Fall Restrictions Weight Bearing Restrictions: Yes RLE Weight Bearing: Non weight bearing Other  Position/Activity Restrictions: per OP notes WBing through heel only      Mobility Bed Mobility Overal bed mobility: Needs Assistance Bed Mobility: Supine to Sit     Supine to sit: HOB elevated;Min assist     General bed mobility comments: increased time    Transfers Overall transfer level: Needs assistance Equipment used: None (drop arm recliner) Transfers: Lateral/Scoot Transfers          Lateral/Scoot Transfers: Min assist General transfer comment: assist initially  for Rt LE and to scoot    Balance Overall balance assessment: Mild deficits observed, not formally tested                                         ADL either performed or assessed with clinical judgement   ADL Overall ADL's : Needs assistance/impaired Eating/Feeding: Independent   Grooming: Wash/dry hands;Wash/dry face;Oral care;Brushing hair;Set up;Sitting;Bed level   Upper Body Bathing: Supervision/ safety;Set up;Sitting;Bed level   Lower Body Bathing: Maximal assistance;Bed level   Upper Body Dressing : Minimal assistance;Sitting   Lower Body Dressing: Total assistance;Bed level   Toilet Transfer: Minimal assistance (Lateral scoot transfer to drop arm recliner (simulated))   Toileting- Clothing Manipulation and Hygiene: Maximal assistance;Sitting/lateral lean       Functional mobility during ADLs: Minimal assistance (lateral scoot transfer)       Vision Baseline Vision/History: Wears glasses Wears Glasses: Reading only Patient Visual Report: No change from baseline  Perception     Praxis      Pertinent Vitals/Pain Pain Assessment: Faces Faces Pain Scale: Hurts even more Pain Location: Rt LE     Hand Dominance     Extremity/Trunk Assessment Upper Extremity Assessment Upper Extremity Assessment: Generalized weakness   Lower Extremity Assessment Lower Extremity Assessment: Defer to PT evaluation   Cervical / Trunk Assessment Cervical / Trunk  Assessment: Normal   Communication Communication Communication: No difficulties   Cognition Arousal/Alertness: Awake/alert Behavior During Therapy: WFL for tasks assessed/performed Overall Cognitive Status: Within Functional Limits for tasks assessed                                 General Comments:  (grossly WFL for basic info)   General Comments  VSS.    Exercises     Shoulder Instructions      Home Living Family/patient expects to be discharged to:: Unsure                                 Additional Comments: Pt has been at SNF, but his plan is to return home with wife in [place] as soon as he is cleared medically and able to manage mod I - min A.  Feel he would benefit from CIR level rehab      Prior Functioning/Environment Level of Independence: Needs assistance  Gait / Transfers Assistance Needed: Pt reports he was performing scoot or sliding board transfers to w/c daily with staff assist ADL's / Homemaking Assistance Needed: pt reports he was performing UB ADLs with setup to min A, and required assist for LB ADLs            OT Problem List: Decreased strength;Impaired balance (sitting and/or standing);Decreased knowledge of use of DME or AE;Decreased knowledge of precautions;Pain      OT Treatment/Interventions: Self-care/ADL training;DME and/or AE instruction;Therapeutic activities;Patient/family education;Balance training;Therapeutic exercise    OT Goals(Current goals can be found in the care plan section) Acute Rehab OT Goals Patient Stated Goal: "I can't wait to go back home" OT Goal Formulation: With patient Time For Goal Achievement: 03/26/20 Potential to Achieve Goals: Good ADL Goals Pt Will Perform Grooming: with modified independence;sitting Pt Will Perform Upper Body Bathing: with modified independence;sitting Pt Will Perform Lower Body Bathing: with supervision;bed level;sitting/lateral leans Pt Will Perform Upper Body  Dressing: with modified independence;sitting Pt Will Perform Lower Body Dressing: with supervision;with set-up;bed level;sitting/lateral leans Pt Will Transfer to Toilet: with supervision;bedside commode Pt Will Perform Toileting - Clothing Manipulation and hygiene: with modified independence;sitting/lateral leans Pt/caregiver will Perform Home Exercise Program: Increased strength;Right Upper extremity;Left upper extremity;Independently;With written HEP provided;With theraband  OT Frequency: Min 2X/week   Barriers to D/C:            Co-evaluation   Reason for Co-Treatment: For patient/therapist safety;Complexity of the patient's impairments (multi-system involvement) PT goals addressed during session: Mobility/safety with mobility;Balance        AM-PAC OT "6 Clicks" Daily Activity     Outcome Measure Help from another person eating meals?: None Help from another person taking care of personal grooming?: A Little Help from another person toileting, which includes using toliet, bedpan, or urinal?: A Lot Help from another person bathing (including washing, rinsing, drying)?: A Lot Help from another person to put on and taking off regular upper body clothing?: A Little Help from another  person to put on and taking off regular lower body clothing?: Total 6 Click Score: 15   End of Session Equipment Utilized During Treatment: Other (comment) ([place] wound VAC) Nurse Communication: Mobility status;Patient requests pain meds;Precautions  Activity Tolerance: Patient tolerated treatment well Patient left: in chair;with call bell/phone within reach;with chair alarm set  OT Visit Diagnosis: Unsteadiness on feet (R26.81);Pain Pain - Right/Left: Right Pain - part of body: Ankle and joints of foot                Time: [id number] OT Time Calculation (min): 37 min Charges:  OT General Charges $OT Visit: 1 Visit OT Evaluation $OT Eval Moderate Complexity: 1 Mod  Nilsa Nutting., OTR/L Acute  Rehabilitation Services Pager [phone] Office [place], [place] 03/12/2020, 10:13 AM

## 2020-03-12 NOTE — TOC Initial Note (Signed)
Transition of Care [facility]) - Initial/Assessment Note    Patient Details  Name: Curtis Solis MRN: [id number] Date of Birth: [birth year]  Transition of Care [facility]) CM/SW Contact:    Benard Halsted, [place] Phone Number: 03/12/2020, 9:07 AM  Clinical Narrative:                 CSW continuing to follow for return to [facility].   Expected Discharge Plan: Skilled Nursing Facility Barriers to Discharge: Continued Medical Work up   Patient Goals and CMS Choice Patient states their goals for this hospitalization and ongoing recovery are:: Feel better CMS Medicare.gov Compare Post Acute Care list provided to:: Patient Choice offered to / list presented to : Patient  Expected Discharge Plan and Services Expected Discharge Plan: [place] In-house Referral: Clinical Social Work   Post Acute Care Choice: [place] Living arrangements for the past 2 months: [place]                                      Prior Living Arrangements/Services Living arrangements for the past 2 months: [place] Lives with:: Facility Resident Patient language and need for interpreter reviewed:: Yes Do you feel safe going back to the place where you live?: Yes      Need for Family Participation in Patient Care: No (Comment) Care giver support system in place?: Yes (comment)   Criminal Activity/Legal Involvement Pertinent to Current Situation/Hospitalization: No - Comment as needed  Activities of Daily Living Home Assistive Devices/Equipment: Wheelchair ADL Screening (condition at time of admission) Patient's cognitive ability adequate to safely complete daily activities?: Yes Is the patient deaf or have difficulty hearing?: No Does the patient have difficulty seeing, even when wearing glasses/contacts?: No Does the patient have difficulty concentrating, remembering, or making decisions?: No Patient able to express need for assistance with  ADLs?: Yes Does the patient have difficulty dressing or bathing?: Yes Independently performs ADLs?: No Communication: Independent Dressing (OT): Needs assistance Is this a change from baseline?: Pre-admission baseline Grooming: Needs assistance Is this a change from baseline?: Pre-admission baseline Feeding: Independent Bathing: Needs assistance Is this a change from baseline?: Pre-admission baseline Toileting: Needs assistance Is this a change from baseline?: Pre-admission baseline In/Out Bed: Dependent Is this a change from baseline?: Pre-admission baseline Walks in Home: Dependent Is this a change from baseline?: Pre-admission baseline Does the patient have difficulty walking or climbing stairs?: Yes Weakness of Legs: Both Weakness of Arms/Hands: Both  Permission Sought/Granted Permission sought to share information with : Chartered certified accountant granted to share information with : Yes, Verbal Permission Granted     Permission granted to share info w AGENCY: [place]        Emotional Assessment Appearance:: Appears stated age     Orientation: : Oriented to Self,Oriented to Place,Oriented to  Time,Oriented to Situation Alcohol / Substance Use: Not Applicable Psych Involvement: No (comment)  Admission diagnosis:  Hyperkalemia [E87.5] Hyponatremia [E87.1] Osteomyelitis (HCC) [M86.9] Acute renal disease [N28.9] AKI (acute kidney injury) ([place]) [N17.9] Acute kidney injury ([place]) [N17.9] Hypotension [I95.9] Hypotension, unspecified hypotension type [I95.9] Patient Active Problem List   Diagnosis Date Noted  . Osteomyelitis ([place])   . Acute renal disease   . AKI (acute kidney injury) ([place])   . PVD (peripheral vascular disease) ([place])   . Controlled type 2 diabetes mellitus with stage 4 chronic kidney disease, without  long-term current use of insulin ([place]) 03/05/2020  . Hypotension 03/05/2020  . Urinary retention 03/04/2020  . 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 GERD without esophagitis 03/04/2020  . Hyponatremia 03/04/2020  . Severe protein-calorie malnutrition ([place]) 02/04/2020  . Acute renal failure with acute tubular necrosis superimposed on stage 4 chronic kidney disease ([place]) 01/31/2020  . Hyperkalemia 01/31/2020  . Uremia 01/31/2020  . Gangrene of right foot ([place]) 01/31/2020  . Ileostomy present ([place]) 01/31/2020  . Open abdominal wall wound, subsequent encounter 01/31/2020  . Open wound of penis 01/31/2020  . Acute lower UTI 01/31/2020  . Pressure injury of skin 01/31/2020  . S/P AKA (above knee amputation) unilateral, left ([place]) 10/23/2019  . 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 AAA (abdominal aortic aneurysm) ([place]) 10/06/2019  . CKD (chronic kidney disease) stage 4, GFR 15-29 ml/min (HCC) 10/06/2019   PCP:  [name], MD Pharmacy:   [facility] [place] [place] - [address] [place] [address] Phone: [phone] Fax: [phone]     Social Determinants of Health ([place]) Interventions    Readmission Risk Interventions No flowsheet data found.

## 2020-03-12 NOTE — Progress Notes (Signed)
PROGRESS NOTE        PATIENT DETAILS Name: Curtis Solis Age: 65 y.o. Sex: male Date of Birth: 1955/10/17 Admit Date: 03/04/2020 Admitting Physician Vernelle Emerald, MD PT:[id number], Denton Ar, MD  Brief Narrative: Patient is a 65 y.o. 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  Significant events: 2/23>> admit from SNF for evaluation of AKI, hyperkalemia, hyponatremia and right foot gangrene.  Significant studies: 2/23>> chest x-ray: No active disease 2/23>> renal ultrasound: Multiple bilateral simple renal cysts-no hydronephrosis. 2/24 >> CT abdomen/pelvis: No evidence of bowel obstruction, no hydronephrosis 2/24>> MRI right foot: Osteo throughout the second through fifth toes.   Antimicrobial therapy: Meropenem: 2/23>> 2/28 Clindamycin: 2/23>> 2/26 Vancomycin: 2/23>> 2/25  Microbiology data: 2/23>> blood culture: No growth 2/23>> urine culture: No growth 2/24>> wound abdomen swab: MRSA  Procedures : 3/2>> right transmetatarsal amputation   Consults: ID Nephrology Orthopedics  DVT Prophylaxis : SCDs Start: 03/11/20 1506 heparin injection 5,000 Units Start: 03/05/20 0600   Subjective: Lying comfortably in bed-he has no major issues overnight.  Denies any chest pain or shortness of breath.   Assessment/Plan: AKI on CKD stage IV: AKI likely ischemic ATN in the setting of high ostomy output and hypotension.  Creatinine has improved with supportive care-creatinine now close to baseline.   Right foot gangrene with underlying osteomyelitis: S/p transmetatarsal amputation on 3/2-ID recommending 2 weeks of oral Zyvox.     Hypotension: Resolved-initially on stress dose hydrocortisone-a.m. cortisol level appropriate-not thought to have adrenal insufficiency-has been resumed on home dosing of Florinef.  Hypomagnesemia: Repleted  Hyponatremia: Due to dehydration-resolved with IV fluids.  Normocytic anemia: Secondary to AKI/CKD/chronic wounds-transfuse if hemoglobin<7.  No evidence of GI bleeding.  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    High output ostomy: Ostomy output overall better-restarted Lomotil yesterday-reassess tomorrow.    DM-2: CBG stable-continue SSI  Recent Labs    03/11/20 1146  GLUCAP 81   Severe protein calorie malnutrition: PEG tube in place-continue feedings.  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 Yes  Diet effective now                  Code Status: Full code   Family Communication: Son-Maurice-[phone] updated on 3/3  Disposition Plan: Status is: Inpatient  Remains inpatient appropriate because:Inpatient level of care appropriate due to severity of illness   Dispo: The patient is from: SNF              Anticipated d/c is to: SNF  Patient currently is not medically stable to  d/c.   Difficult to place patient No   Barriers to Discharge: Right foot gangrene-TMA scheduled for 3/2  Antimicrobial agents: Anti-infectives (From admission, onward)   Start     Dose/Rate Route Frequency Ordered Stop   03/11/20 2200  linezolid (ZYVOX) tablet 600 mg        600 mg Oral Every 12 hours 03/11/20 1541 03/25/20 0959   03/11/20 0800  ceFAZolin (ANCEF) IVPB 2g/100 mL premix        2 g 200 mL/hr over 30 Minutes Intravenous To Short Stay 03/11/20 0710 03/11/20 1408   03/09/20 1800  vancomycin (VANCOREADY) IVPB 1000 mg/200 mL  Status:  Discontinued        1,000 mg 200 mL/hr over 60 Minutes Intravenous  Once 03/09/20 1015 03/09/20 1318   03/08/20 1400  meropenem (MERREM) 1 g in sodium chloride 0.9 % 100 mL IVPB  Status:  Discontinued        1 g 200 mL/hr over 30 Minutes Intravenous Every 12 hours 03/08/20 0922 03/09/20 1318   03/07/20 0315  vancomycin (VANCOREADY) IVPB 1000 mg/200 mL        1,000 mg 200 mL/hr over 60 Minutes Intravenous  Once 03/07/20 0223 03/07/20 0449   03/06/20 0000  vancomycin variable dose per unstable renal function (pharmacist dosing)  Status:  Discontinued         Does not apply See admin instructions 03/05/20 0926 03/09/20 1318   03/05/20 0300  meropenem (MERREM) 1 g in sodium chloride 0.9 % 100 mL IVPB  Status:  Discontinued        1 g 200 mL/hr over 30 Minutes Intravenous Every 24 hours 03/05/20 0231 03/08/20 0922   03/05/20 0245  vancomycin (VANCOREADY) IVPB 1250 mg/250 mL        1,250 mg 166.7 mL/hr over 90 Minutes Intravenous  Once 03/05/20 0231 03/05/20 0525   03/05/20 0230  clindamycin (CLEOCIN) IVPB 600 mg        600 mg 100 mL/hr over 30 Minutes Intravenous Every 8 hours 03/05/20 0223 03/07/20 2255       Time spent: 25 minutes-Greater than 50% of this time was spent in counseling, explanation of diagnosis, planning of further management, and coordination of care.  MEDICATIONS: Scheduled Meds: . atorvastatin  40 mg Oral Daily  .  Chlorhexidine Gluconate Cloth  6 each Topical Daily  . darbepoetin (ARANESP) injection - NON-DIALYSIS  40 mcg Subcutaneous Q Fri-1800  . diphenoxylate-atropine  1 tablet Oral QID  . docusate sodium  100 mg Oral BID  . feeding supplement (NEPRO CARB STEADY)  237 mL Oral BID BM  . feeding supplement (PROSource TF)  45 mL Per Tube BID  . fludrocortisone  0.1 mg Oral Daily  . gabapentin  200 mg Oral QHS  . heparin  5,000 Units Subcutaneous Q8H  . insulin aspart  0-9 Units Subcutaneous Q4H  . linezolid  600 mg Oral Q12H  . liver oil-zinc oxide   Topical BID  . melatonin  5 mg Oral QHS  . multivitamin  1 tablet Oral QHS  . mupirocin ointment  1 application Nasal BID  . pantoprazole  20 mg Oral Daily  . sodium bicarbonate  1,300 mg Oral BID   Continuous Infusions: . sodium chloride 10 mL/hr at 03/11/20 1401  . sodium chloride 75 mL/hr at 03/12/20 0653  . feeding supplement (OSMOLITE 1.5 CAL) Stopped (03/12/20 0642)   PRN Meds:.acetaminophen **OR** acetaminophen, calcium carbonate, fentaNYL (SUBLIMAZE)  injection, hydrOXYzine, ipratropium-albuterol, metoCLOPramide **OR** metoCLOPramide (REGLAN) injection, ondansetron **OR** ondansetron (ZOFRAN) IV   PHYSICAL EXAM: Vital signs: Vitals:   03/11/20 2338 03/12/20 0332 03/12/20 0723 03/12/20 1150  BP: 96/60 112/65 131/73 130/79  Pulse: 85 83 71 76  Resp: '18 20 18 18  '$ Temp: 98 F (36.7 C) 98.4 F (36.9 C) 98.6 F (37 C) 98.2 F (36.8 C)  TempSrc: Oral Oral Axillary Oral  SpO2: 99% 100% 100% 94%  Weight:  67.6 kg    Height:       Filed Weights   03/10/20 0448 03/11/20 1233 03/12/20 0332  Weight: 53.7 kg 53.7 kg 67.6 kg   Body mass index is 20.79 kg/m.  Gen Exam:Alert awake-not in any distress HEENT:atraumatic, normocephalic Chest: B/L clear to auscultation anteriorly CVS:S1S2 regular Abdomen:soft non tender, non distended Extremities: Left AKA, right foot TMA-dressing in place. Neurology: Non focal Skin: no rash  I have  personally reviewed following labs and imaging studies  LABORATORY DATA: CBC: Recent Labs  Lab 03/08/20 0315 03/09/20 0057 03/10/20 0059 03/11/20 0135 03/12/20 0025  WBC 10.1 12.0* 11.2* 11.0* 18.2*  HGB 7.7* 8.3* 8.3* 7.7* 8.4*  HCT 23.8* 26.2* 26.8* 24.3* 26.5*  MCV 90.2 88.8 91.5 91.7 92.0  PLT 230 273 272 259 [id number]    Basic Metabolic Panel: Recent Labs  Lab 03/06/20 0141 03/07/20 0130 03/08/20 0315 03/09/20 0057 03/10/20 0059 03/11/20 0135 03/12/20 0025  NA 136 133* 139 136 137 137  --   K 5.3* 4.4 3.4* 3.0* 3.9 3.8  --   CL 101 106 114* 109 108 109  --   CO2 20* 18* 15* 19* 22 21*  --   GLUCOSE 145* 153* 172* 129* 108* 98  --   BUN 80* 71* 54* 50* 45* 41*  --   CREATININE 7.44* 5.27* 3.34* 2.59* 2.27* 2.23*  --   CALCIUM 9.2 8.7* 7.4* 8.2* 8.5* 8.6*  --   MG 1.9 1.6* 1.1* 2.0 1.7 1.5* 2.5*  PHOS 6.6* 5.1*  --  1.8* 2.1* 2.8  --     GFR: Estimated Creatinine Clearance: 32.8 mL/min (A) (by C-G formula based on SCr of 2.23 mg/dL (H)).  Liver Function Tests: Recent Labs  Lab 03/06/20 0141 03/09/20 0057 03/10/20 0059 03/11/20 0135  AST  --  '15 21 25  '$ ALT  --  '18 26 30  '$ ALKPHOS  --  53 51 40  BILITOT  --  0.7 0.7 1.0  PROT  --  5.6* 5.3* 5.1*  ALBUMIN 2.1* 2.0* 2.0* 2.1*   No results for input(s): LIPASE, AMYLASE in the last 168 hours. No results for input(s): AMMONIA in the last 168 hours.  Coagulation Profile: No results for input(s): INR, PROTIME in the last 168 hours.  Cardiac Enzymes: Recent Labs  Lab 03/06/20 0141  CKTOTAL 16*    BNP (last 3 results) No results for input(s): PROBNP in the last 8760 hours.  Lipid Profile: No results for input(s): CHOL, HDL, LDLCALC, TRIG, CHOLHDL, LDLDIRECT in the last 72 hours.  Thyroid Function Tests: No results for input(s): TSH, T4TOTAL, FREET4, T3FREE, THYROIDAB in the last 72 hours.  Anemia Panel: No results for input(s): VITAMINB12, FOLATE, FERRITIN, TIBC, IRON, RETICCTPCT in the last 72  hours.  Urine analysis:    Component Value Date/Time   COLORURINE AMBER (A) 03/04/2020 1911   APPEARANCEUR CLOUDY (A) 03/04/2020 1911   LABSPEC 1.018 03/04/2020 1911   PHURINE 5.0 03/04/2020 1911   GLUCOSEU NEGATIVE 03/04/2020 1911   HGBUR  MODERATE (A) 03/04/2020 1911   BILIRUBINUR NEGATIVE 03/04/2020 1911   KETONESUR NEGATIVE 03/04/2020 1911   PROTEINUR 30 (A) 03/04/2020 1911   NITRITE NEGATIVE 03/04/2020 1911   LEUKOCYTESUR LARGE (A) 03/04/2020 1911    Sepsis Labs: Lactic Acid, Venous    Component Value Date/Time   LATICACIDVEN 1.1 03/04/2020 2039    MICROBIOLOGY: Recent Results (from the past 240 hour(s))  Blood culture (routine x 2)     Status: None   Collection Time: 03/04/20  7:05 PM   Specimen: BLOOD  Result Value Ref Range Status   Specimen Description BLOOD SITE NOT SPECIFIED  Final   Special Requests   Final    BOTTLES DRAWN AEROBIC AND ANAEROBIC Blood Culture results may not be optimal due to an inadequate volume of blood received in culture bottles   Culture   Final    NO GROWTH 5 DAYS Performed at [facility], [place] [address], [place], [place] [address]    Report Status 03/09/2020 FINAL  Final  Blood culture (routine x 2)     Status: None   Collection Time: 03/04/20  7:10 PM   Specimen: BLOOD  Result Value Ref Range Status   Specimen Description BLOOD BLOOD LEFT FOREARM  Final   Special Requests   Final    BOTTLES DRAWN AEROBIC AND ANAEROBIC Blood Culture results may not be optimal due to an inadequate volume of blood received in culture bottles   Culture   Final    NO GROWTH 5 DAYS Performed at [facility], [place] [address], [place], [place] [address]    Report Status 03/09/2020 FINAL  Final  SARS CORONAVIRUS 2 (TAT 6-24 HRS) Nasopharyngeal Urine, Catheterized     Status: None   Collection Time: 03/04/20  7:54 PM   Specimen: Urine, Catheterized; Nasopharyngeal  Result Value Ref Range Status   SARS Coronavirus 2 NEGATIVE NEGATIVE Final     Comment: (NOTE) SARS-CoV-2 target nucleic acids are NOT DETECTED.  The SARS-CoV-2 RNA is generally detectable in upper and lower respiratory specimens during the acute phase of infection. Negative results do not preclude SARS-CoV-2 infection, do not rule out co-infections with other pathogens, and should not be used as the sole basis for treatment or other patient management decisions. Negative results must be combined with clinical observations, patient history, and epidemiological information. The expected result is Negative.  Fact Sheet for Patients: SugarRoll.be  Fact Sheet for Healthcare Providers: https://www.woods-mathews.com/  This test is not yet approved or cleared by the [place] FDA and  has been authorized for detection and/or diagnosis of SARS-CoV-2 by FDA under an Emergency Use Authorization (EUA). This EUA will remain  in effect (meaning this test can be used) for the duration of the COVID-19 declaration under Se ction 564(b)(1) of the Act, 21 U.S.C. section 360bbb-3(b)(1), unless the authorization is terminated or revoked sooner.  Performed at [facility], [place] [address] [place], [place] [address]   Urine culture     Status: None   Collection Time: 03/04/20  9:02 PM   Specimen: Urine, Random  Result Value Ref Range Status   Specimen Description URINE, RANDOM  Final   Special Requests NONE  Final   Culture   Final    NO GROWTH Performed at [facility], [place] [address] [place], [place] [address]    Report Status 03/06/2020 FINAL  Final  Aerobic Culture w Gram Stain (superficial specimen)     Status: None  Collection Time: 03/05/20  3:03 AM   Specimen: Wound  Result Value Ref Range Status   Specimen Description WOUND STOMACH  Final   Special Requests NONE  Final   Gram Stain   Final    RARE WBC PRESENT, PREDOMINANTLY PMN MODERATE GRAM POSITIVE COCCI Performed at [facility], [place] [address] [place], [place] [address]    Culture   Final    ABUNDANT METHICILLIN RESISTANT STAPHYLOCOCCUS AUREUS   Report Status 03/07/2020 FINAL  Final   Organism ID, Bacteria METHICILLIN RESISTANT STAPHYLOCOCCUS AUREUS  Final      Susceptibility   Methicillin resistant staphylococcus aureus - MIC*    CIPROFLOXACIN >=8 RESISTANT Resistant     ERYTHROMYCIN >=8 RESISTANT Resistant     GENTAMICIN <=0.5 SENSITIVE Sensitive     OXACILLIN >=4 RESISTANT Resistant     TETRACYCLINE <=1 SENSITIVE Sensitive     VANCOMYCIN 1 SENSITIVE Sensitive     TRIMETH/SULFA <=10 SENSITIVE Sensitive     CLINDAMYCIN <=0.25 SENSITIVE Sensitive     RIFAMPIN <=0.5 SENSITIVE Sensitive     Inducible Clindamycin NEGATIVE Sensitive     * ABUNDANT METHICILLIN RESISTANT STAPHYLOCOCCUS AUREUS  Surgical PCR screen     Status: Abnormal   Collection Time: 03/11/20  7:25 AM   Specimen: Nasal Mucosa; Nasal Swab  Result Value Ref Range Status   MRSA, PCR POSITIVE (A) NEGATIVE Final    Comment: RESULT CALLED TO, READ BACK BY AND VERIFIED WITH: RN G.SCALCL ON 03/11/2020 AT 0954 BY E.PARRISH    Staphylococcus aureus POSITIVE (A) NEGATIVE Final    Comment: (NOTE) The Xpert SA Assay (FDA approved for NASAL specimens in patients 81 years of age and older), is one component of a comprehensive surveillance program. It is not intended to diagnose infection nor to guide or monitor treatment. Performed at [facility], [place] [address], [place],  [address]     RADIOLOGY STUDIES/RESULTS: No results found.   LOS: 8 days   Oren Binet, MD  [facility]    To contact the attending provider between 7A-7P or the covering provider during after hours 7P-7A, please log into the web site [email or web address] and access using universal [place] password for that web site. If you do not have the password, please call the hospital operator.  03/12/2020, 12:12 PM

## 2020-03-12 NOTE — Progress Notes (Signed)
Inpatient Rehab Admissions Coordinator Note:   Per therapy recommendations, pt was screened for CIR candidacy by Shann Medal, PT, DPT.  At this time we are recommending a CIR consult.  Note that pt previous at SNF for rehab.  Would absolutely not be able to return to SNF from CIR ([employer] would not approve) so will need to confirm pt has appropriate assistance at home to support a CIR admission.  I will place an order per our protocol.  Please contact me with questions.   Shann Medal, PT, DPT [phone] 03/12/20 2:20 PM

## 2020-03-12 NOTE — Plan of Care (Signed)
  Problem: Education: Goal: Knowledge of disease and its progression will improve Outcome: Progressing   Problem: Fluid Volume: Goal: Compliance with measures to maintain balanced fluid volume will improve Outcome: Progressing   Problem: Health Behavior/Discharge Planning: Goal: Ability to manage health-related needs will improve Outcome: Progressing   Problem: Nutritional: Goal: Ability to make healthy dietary choices will improve Outcome: Progressing   Problem: Clinical Measurements: Goal: Complications related to the disease process, condition or treatment will be avoided or minimized Outcome: Progressing   Problem: Education: Goal: Knowledge of General Education information will improve Description: Including pain rating scale, medication(s)/side effects and non-pharmacologic comfort measures Outcome: Progressing   Problem: Health Behavior/Discharge Planning: Goal: Ability to manage health-related needs will improve Outcome: Progressing   Problem: Clinical Measurements: Goal: Ability to maintain clinical measurements within normal limits will improve Outcome: Progressing Goal: Will remain free from infection Outcome: Progressing Goal: Diagnostic test results will improve Outcome: Progressing Goal: Respiratory complications will improve Outcome: Progressing Goal: Cardiovascular complication will be avoided Outcome: Progressing   Problem: Activity: Goal: Risk for activity intolerance will decrease Outcome: Progressing   Problem: Nutrition: Goal: Adequate nutrition will be maintained Outcome: Progressing   Problem: Coping: Goal: Level of anxiety will decrease Outcome: Progressing   Problem: Elimination: Goal: Will not experience complications related to bowel motility Outcome: Progressing Goal: Will not experience complications related to urinary retention Outcome: Progressing   Problem: Pain Managment: Goal: General experience of comfort will  improve Outcome: Progressing   Problem: Safety: Goal: Ability to remain free from injury will improve Outcome: Progressing   Problem: Skin Integrity: Goal: Risk for impaired skin integrity will decrease Outcome: Progressing

## 2020-03-12 NOTE — Progress Notes (Signed)
Orthopedic Tech Progress Note Patient Details:  Curtis Solis 05/06/1955 [id number]  Ortho Devices Type of Ortho Device: Darco shoe Ortho Device/Splint Interventions: Ordered,Other (comment) (dropped off with therapy (PT/OT) in the room to apply)   Post Interventions Patient Tolerated: Other (comment) (therapy assessed tolerance) Instructions Provided: Other (comment) (therapy provided education)  Dropped off smallest DARCO shoe to room with PT/OT to apply.  Given two choices, they chose the smaller one.  PT/OT to secure the order.  Thanks,  Verdene Lennert, PT, DPT  Acute Rehabilitation [phone] pager #[phone] office      Curtis Solis 03/12/2020, 9:29 AM

## 2020-03-13 LAB — BASIC METABOLIC PANEL
Anion gap: 8 (ref 5–15)
BUN: 47 mg/dL — ABNORMAL HIGH (ref 8–23)
CO2: 20 mmol/L — ABNORMAL LOW (ref 22–32)
Calcium: 8.5 mg/dL — ABNORMAL LOW (ref 8.9–10.3)
Chloride: 106 mmol/L (ref 98–111)
Creatinine, Ser: 2.01 mg/dL — ABNORMAL HIGH (ref 0.61–1.24)
GFR, Estimated: 37 mL/min — ABNORMAL LOW (ref >60)
Glucose, Bld: 100 mg/dL — ABNORMAL HIGH (ref 70–99)
Potassium: 4.1 mmol/L (ref 3.5–5.1)
Sodium: 134 mmol/L — ABNORMAL LOW (ref 135–145)

## 2020-03-13 LAB — SARS CORONAVIRUS 2 (TAT 6-24 HRS): SARS Coronavirus 2: NEGATIVE

## 2020-03-13 MED ORDER — PROSOURCE TF PO LIQD
490.0000 mL | Freq: Two times a day (BID) | ORAL | Status: DC
  Filled 2020-03-13: qty 495

## 2020-03-13 MED ORDER — PROSOURCE TF PO LIQD
45.0000 mL | Freq: Two times a day (BID) | ORAL | Status: DC
  Administered 2020-03-13 – 2020-03-14 (×2): 45 mL
  Filled 2020-03-13 (×3): qty 45

## 2020-03-13 MED ORDER — VITAL AF 1.2 CAL PO LIQD
237.0000 mL | Freq: Three times a day (TID) | ORAL | Status: DC
  Administered 2020-03-13 – 2020-03-14 (×3): 237 mL
  Filled 2020-03-13 (×5): qty 237

## 2020-03-13 MED ORDER — NUTRISOURCE FIBER PO PACK
1.0000 | PACK | Freq: Two times a day (BID) | ORAL | Status: DC
  Administered 2020-03-13 – 2020-03-14 (×3): 1
  Filled 2020-03-13 (×4): qty 1

## 2020-03-13 MED ORDER — DIPHENHYDRAMINE HCL 25 MG PO CAPS
25.0000 mg | ORAL_CAPSULE | ORAL | Status: DC | PRN
  Administered 2020-03-13: 25 mg via ORAL
  Filled 2020-03-13: qty 1

## 2020-03-13 MED ORDER — OXYCODONE HCL 5 MG PO TABS
10.0000 mg | ORAL_TABLET | ORAL | Status: DC | PRN
  Administered 2020-03-13 (×4): 15 mg via ORAL
  Administered 2020-03-14 (×2): 10 mg via ORAL
  Filled 2020-03-13 (×4): qty 3
  Filled 2020-03-13 (×2): qty 2

## 2020-03-13 MED ORDER — ACETAMINOPHEN 500 MG PO TABS
1000.0000 mg | ORAL_TABLET | Freq: Three times a day (TID) | ORAL | Status: DC
  Administered 2020-03-13 – 2020-03-14 (×4): 1000 mg via ORAL
  Filled 2020-03-13 (×4): qty 2

## 2020-03-13 MED ORDER — VITAL 1.5 CAL PO LIQD
237.0000 mL | Freq: Two times a day (BID) | ORAL | Status: DC
  Filled 2020-03-13 (×2): qty 237

## 2020-03-13 NOTE — TOC Progression Note (Addendum)
Transition of Care [facility]) - Progression Note    Patient Details  Name: Curtis Solis MRN: [id number] Date of Birth: [birth year]  Transition of Care [facility]) CM/SW [place], [place] Work Phone Number: 03/13/2020, 4:44 PM  Clinical Narrative:    CSW notified patient that he would be returning to [facility] tomorrow (by [facility]) in the event that CIR is unable to accept him. Patient reported understanding this and indicated he had no further questions at this time.    Expected Discharge Plan: [place] Barriers to Discharge: Continued Medical Work up  Expected Discharge Plan and Services Expected Discharge Plan: [place] In-house Referral: Clinical Social Work   Post Acute Care Choice: [place] Living arrangements for the past 2 months: [place]                                       Social Determinants of Health (SDOH) Interventions    Readmission Risk Interventions No flowsheet data found.

## 2020-03-13 NOTE — Progress Notes (Addendum)
Nutrition Follow-up  DOCUMENTATION CODES:   Severe malnutrition in context of chronic illness  INTERVENTION:  Encourage PO intake.   Provide bolus tube feed via PEG using Vital AF 1.2 formula at bolus volume of 237 ml TID.   Provide 45 ml Prosource TF BID per tube.   Tube feeds to provide 933 kcal (52% of kcal needs), 75 grams of protein (76% of protein needs), 576 ml free water.   Provide Nutrisource fiber BID per tube.   Continue MVI once daily.    NUTRITION DIAGNOSIS:   Severe Malnutrition related to chronic illness (Open abdominal wound s/p small bowel resection) as evidenced by severe muscle depletion,severe fat depletion,percent weight loss; ongoing  GOAL:   Patient will meet greater than or equal to 90% of their needs; progressing  MONITOR:   PO intake,Supplement acceptance,TF tolerance,I & O's  REASON FOR ASSESSMENT:   Consult Poor PO,Wound healing,Other (Comment),Calorie Count (Assessment of nutrition requirements/status)  ASSESSMENT:   65 yo male with PMH of T2DM, CKD stage IV, severe PCM s/p PEG, HTN, s/p ilieostomy, chronic urinary retention, open abdominal wound, and s/p bowel resection presents from SNF with abnormal labs including recurrent hyponatremia, hyperkalemia and elevated creatinine. Note left AKA. At the rehab facility, they used his PEG tube for nutrition briefly but he started having high ostomy output, so they stopped the feeds.  3/2 - s/p R transmetatarsal amputation  Noted pt severe malnourished. Plans to continue to use enteral nutrition via PEG to aid in caloric and protein needs. Per  MD, pt with high ostomy output of >3L. RD consulted to modify tube feeds to aid in decreasing ileostomy output. Plans to switch formula to hydrolyzed formula to aid in tolerance and absorption. Per MD, PO intake has improved. Pt asleep during time of visit and did not awake to RD visit. Meal completion 100% yesterday. RD to modify tube feeding orders. Will  additionally order Nutrisource fiber per tube to aid in bulking stool and decreasing ostomy output. RD to continue to monitor. If high ostomy output continues despite feeding order change, will re-evaluate interventions.   Labs and medications reviewed.   Diet Order:   Diet Order            Diet Carb Modified Fluid consistency: Thin; Room service appropriate? Yes  Diet effective now                 EDUCATION NEEDS:   Education needs have been addressed  Skin:  Skin Assessment: Skin Integrity Issues: Skin Integrity Issues:: Wound VAC Other: Wounds on L anterior and posterior thigh, Stage II pressure ulcer on buttocks, wound on R anterior thigh, abdominal wound from previous surgery, wounds on R foot toes  Last BM:  3/4 - ileostomy- 3L output over past 24 hours  Height:   Ht Readings from Last 1 Encounters:  03/11/20 '5\' 11"'$  (1.803 m)    Weight:   Wt Readings from Last 1 Encounters:  03/12/20 67.6 kg    Ideal Body Weight:  72 kg  BMI:  Body mass index is 20.79 kg/m.  Estimated Nutritional Needs:   Kcal:  1900-2200  Protein:  100-115 grams  Fluid:  >2 L  Corrin Parker, MS, RD, LDN RD pager number/after hours weekend pager number on Amion.

## 2020-03-13 NOTE — Progress Notes (Signed)
PROGRESS NOTE        PATIENT DETAILS Name: Curtis Solis Age: 65 y.o. Sex: male Date of Birth: [birth year] Admit Date: 03/04/2020 Admitting Physician Vernelle Emerald, MD PT:[id number], Denton Ar, MD  Brief Narrative: Patient is a 65 y.o. 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  Significant events: 2/23>> admit from SNF for evaluation of AKI, hyperkalemia, hyponatremia and right foot gangrene.  Significant studies: 2/23>> chest x-ray: No active disease 2/23>> renal ultrasound: Multiple bilateral simple renal cysts-no hydronephrosis. 2/24 >> CT abdomen/pelvis: No evidence of bowel obstruction, no hydronephrosis 2/24>> MRI right foot: Osteo throughout the second through fifth toes.   Antimicrobial therapy: Meropenem: 2/23>> 2/28 Clindamycin: 2/23>> 2/26 Vancomycin: 2/23>> 2/25  Microbiology data: 2/23>> blood culture: No growth 2/23>> urine culture: No growth 2/24>> wound abdomen swab: MRSA  Procedures : 3/2>> right transmetatarsal amputation   Consults: ID Nephrology Orthopedics  DVT Prophylaxis : SCDs Start: 03/11/20 1506 heparin injection 5,000 Units Start: 03/05/20 0600   Subjective: Pain at the operative site.  Assessment/Plan: AKI on CKD stage IV: AKI likely ischemic ATN in the setting of high ostomy output and hypotension.  Creatinine has improved with supportive care-creatinine now close to baseline.   Right foot gangrene with underlying osteomyelitis: S/p transmetatarsal amputation on 3/2-ID recommending 2 weeks of oral Zyvox.  Continues to have significant pain today-have increased dosage of oxycodone-have  added scheduled Tylenol.  Hypotension: Resolved-initially on stress dose hydrocortisone-a.m. cortisol level appropriate-not thought to have adrenal insufficiency-has been resumed on home dosing of Florinef.  Hypomagnesemia: Repleted  Hyponatremia: Due to dehydration-resolved with IV fluids.  Normocytic anemia: Secondary to AKI/CKD/chronic wounds-transfuse if hemoglobin<7.  No evidence of GI bleeding.  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    High output ostomy: Significant output via ostomy overnight-patient was inadvertently given quite a bit of tube feeds last night-tube feeds currently on hold-Per nursing staff-patient has significant oral intake-have spoken with dietitian to see if we can minimize tube feeds given improvement in oral intake.  Continue with Lomotil.  DM-2: CBG stable-continue SSI  Recent Labs    03/11/20 1146  GLUCAP 81   Severe protein calorie malnutrition: PEG tube in place-see above-plans to consult dietitian to adjust tube feedings.  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 Yes  Diet effective now                  Code Status: Full code   Family  Communication: Son-Maurice-[id number] updated on 3/3  Disposition Plan: Status is: Inpatient  Remains inpatient appropriate  because:Inpatient level of care appropriate due to severity of illness   Dispo: The patient is from: SNF              Anticipated d/c is to: SNF              Patient currently is not medically stable to d/c.   Difficult to place patient No   Barriers to Discharge: Right foot gangrene-TMA scheduled for 3/2  Antimicrobial agents: Anti-infectives (From admission, onward)   Start     Dose/Rate Route Frequency Ordered Stop   03/11/20 2200  linezolid (ZYVOX) tablet 600 mg        600 mg Oral Every 12 hours 03/11/20 1541 03/25/20 0959   03/11/20 0800  ceFAZolin (ANCEF) IVPB 2g/100 mL premix        2 g 200 mL/hr over 30 Minutes Intravenous To Short Stay 03/11/20 0710 03/11/20 1408   03/09/20 1800  vancomycin (VANCOREADY) IVPB 1000 mg/200 mL  Status:  Discontinued        1,000 mg 200 mL/hr over 60 Minutes Intravenous  Once 03/09/20 1015 03/09/20 1318   03/08/20 1400  meropenem (MERREM) 1 g in sodium chloride 0.9 % 100 mL IVPB  Status:  Discontinued        1 g 200 mL/hr over 30 Minutes Intravenous Every 12 hours 03/08/20 0922 03/09/20 1318   03/07/20 0315  vancomycin (VANCOREADY) IVPB 1000 mg/200 mL        1,000 mg 200 mL/hr over 60 Minutes Intravenous  Once 03/07/20 0223 03/07/20 0449   03/06/20 0000  vancomycin variable dose per unstable renal function (pharmacist dosing)  Status:  Discontinued         Does not apply See admin instructions 03/05/20 0926 03/09/20 1318   03/05/20 0300  meropenem (MERREM) 1 g in sodium chloride 0.9 % 100 mL IVPB  Status:  Discontinued        1 g 200 mL/hr over 30 Minutes Intravenous Every 24 hours 03/05/20 0231 03/08/20 0922   03/05/20 0245  vancomycin (VANCOREADY) IVPB 1250 mg/250 mL        1,250 mg 166.7 mL/hr over 90 Minutes Intravenous  Once 03/05/20 0231 03/05/20 0525   03/05/20 0230  clindamycin (CLEOCIN) IVPB 600 mg         600 mg 100 mL/hr over 30 Minutes Intravenous Every 8 hours 03/05/20 0223 03/07/20 2255       Time spent: 25 minutes-Greater than 50% of this time was spent in counseling, explanation of diagnosis, planning of further management, and coordination of care.  MEDICATIONS: Scheduled Meds: . acetaminophen  1,000 mg Oral Q8H  . atorvastatin  40 mg Oral Daily  . Chlorhexidine Gluconate Cloth  6 each Topical Daily  . darbepoetin (ARANESP) injection - NON-DIALYSIS  40 mcg Subcutaneous Q Fri-1800  . diphenoxylate-atropine  1 tablet Oral QID  . feeding supplement (NEPRO CARB STEADY)  237 mL Oral BID BM  . feeding supplement (PROSource TF)  45 mL Per Tube BID  . fludrocortisone  0.1 mg Oral Daily  . gabapentin  200 mg Oral QHS  . heparin  5,000 Units Subcutaneous Q8H  . insulin aspart  0-9 Units Subcutaneous Q4H  . linezolid  600 mg Oral Q12H  . liver oil-zinc oxide   Topical BID  . melatonin  5 mg Oral QHS  . multivitamin  1 tablet Oral QHS  . mupirocin ointment  1 application Nasal BID  . pantoprazole  20 mg Oral Daily  . sodium  bicarbonate  1,300 mg Oral BID   Continuous Infusions: . sodium chloride 10 mL/hr at 03/11/20 1401  . sodium chloride 10 mL/hr at 03/12/20 1240  . feeding supplement (OSMOLITE 1.5 CAL) 1,000 mL (03/12/20 1740)   PRN Meds:.calcium carbonate, fentaNYL (SUBLIMAZE) injection, hydrOXYzine, ipratropium-albuterol, metoCLOPramide **OR** metoCLOPramide (REGLAN) injection, ondansetron **OR** ondansetron (ZOFRAN) IV, oxyCODONE   PHYSICAL EXAM: Vital signs: Vitals:   03/12/20 2327 03/13/20 0342 03/13/20 0723 03/13/20 1132  BP: 119/70 130/67 134/84 130/68  Pulse: 76 67 62 71  Resp: '18 19 20 20  '$ Temp: 98.3 F (36.8 C) 97.8 F (36.6 C) 98.2 F (36.8 C) 98.2 F (36.8 C)  TempSrc: Oral Oral Oral Oral  SpO2: 100% 100% 100% 100%  Weight:      Height:       Filed Weights   03/10/20 0448 03/11/20 1233 03/12/20 0332  Weight: 53.7 kg 53.7 kg 67.6 kg   Body mass  index is 20.79 kg/m.  Gen Exam:Alert awake-not in any distress HEENT:atraumatic, normocephalic Chest: B/L clear to auscultation anteriorly CVS:S1S2 regular Abdomen:soft non tender, non distended Extremities: Left AKA-right TMA-dressing in place. Neurology: Non focal Skin: no rash  I have personally reviewed following labs and imaging studies  LABORATORY DATA: CBC: Recent Labs  Lab 03/08/20 0315 03/09/20 0057 03/10/20 0059 03/11/20 0135 03/12/20 0025  WBC 10.1 12.0* 11.2* 11.0* 18.2*  HGB 7.7* 8.3* 8.3* 7.7* 8.4*  HCT 23.8* 26.2* 26.8* 24.3* 26.5*  MCV 90.2 88.8 91.5 91.7 92.0  PLT 230 273 272 259 [id number]    Basic Metabolic Panel: Recent Labs  Lab 03/07/20 0130 03/08/20 0315 03/09/20 0057 03/10/20 0059 03/11/20 0135 03/12/20 0025 03/13/20 0208  NA 133* 139 136 137 137  --  134*  K 4.4 3.4* 3.0* 3.9 3.8  --  4.1  CL 106 114* 109 108 109  --  106  CO2 18* 15* 19* 22 21*  --  20*  GLUCOSE 153* 172* 129* 108* 98  --  100*  BUN 71* 54* 50* 45* 41*  --  47*  CREATININE 5.27* 3.34* 2.59* 2.27* 2.23*  --  2.01*  CALCIUM 8.7* 7.4* 8.2* 8.5* 8.6*  --  8.5*  MG 1.6* 1.1* 2.0 1.7 1.5* 2.5*  --   PHOS 5.1*  --  1.8* 2.1* 2.8  --   --     GFR: Estimated Creatinine Clearance: 36.4 mL/min (A) (by C-G formula based on SCr of 2.01 mg/dL (H)).  Liver Function Tests: Recent Labs  Lab 03/09/20 0057 03/10/20 0059 03/11/20 0135  AST '15 21 25  '$ ALT '18 26 30  '$ ALKPHOS 53 51 40  BILITOT 0.7 0.7 1.0  PROT 5.6* 5.3* 5.1*  ALBUMIN 2.0* 2.0* 2.1*   No results for input(s): LIPASE, AMYLASE in the last 168 hours. No results for input(s): AMMONIA in the last 168 hours.  Coagulation Profile: No results for input(s): INR, PROTIME in the last 168 hours.  Cardiac Enzymes: No results for input(s): CKTOTAL, CKMB, CKMBINDEX, TROPONINI in the last 168 hours.  BNP (last 3 results) No results for input(s): PROBNP in the last 8760 hours.  Lipid Profile: No results for input(s): CHOL,  HDL, LDLCALC, TRIG, CHOLHDL, LDLDIRECT in the last 72 hours.  Thyroid Function Tests: No results for input(s): TSH, T4TOTAL, FREET4, T3FREE, THYROIDAB in the last 72 hours.  Anemia Panel: No results for input(s): VITAMINB12, FOLATE, FERRITIN, TIBC, IRON, RETICCTPCT in the last 72 hours.  Urine analysis:    Component Value Date/Time   COLORURINE AMBER (  A) 03/04/2020 1911   APPEARANCEUR CLOUDY (A) 03/04/2020 1911   LABSPEC 1.018 03/04/2020 1911   PHURINE 5.0 03/04/2020 1911   GLUCOSEU NEGATIVE 03/04/2020 1911   HGBUR MODERATE (A) 03/04/2020 1911   BILIRUBINUR NEGATIVE 03/04/2020 1911   KETONESUR NEGATIVE 03/04/2020 1911   PROTEINUR 30 (A) 03/04/2020 1911   NITRITE NEGATIVE 03/04/2020 1911   LEUKOCYTESUR LARGE (A) 03/04/2020 1911    Sepsis Labs: Lactic Acid, Venous    Component Value Date/Time   LATICACIDVEN 1.1 03/04/2020 2039    MICROBIOLOGY: Recent Results (from the past 240 hour(s))  Blood culture (routine x 2)     Status: None   Collection Time: 03/04/20  7:05 PM   Specimen: BLOOD  Result Value Ref Range Status   Specimen Description BLOOD SITE NOT SPECIFIED  Final   Special Requests   Final    BOTTLES DRAWN AEROBIC AND ANAEROBIC Blood Culture results may not be optimal due to an inadequate volume of blood received in culture bottles   Culture   Final    NO GROWTH 5 DAYS Performed at [facility], [place] [address], [place], [place] [address]    Report Status 03/09/2020 FINAL  Final  Blood culture (routine x 2)     Status: None   Collection Time: 03/04/20  7:10 PM   Specimen: BLOOD  Result Value Ref Range Status   Specimen Description BLOOD BLOOD LEFT FOREARM  Final   Special Requests   Final    BOTTLES DRAWN AEROBIC AND ANAEROBIC Blood Culture results may not be optimal due to an inadequate volume of blood received in culture bottles   Culture   Final    NO GROWTH 5 DAYS Performed at [facility], [place] [address] [place], [place] [address]    Report  Status 03/09/2020 FINAL  Final  SARS CORONAVIRUS 2 (TAT 6-24 HRS) Nasopharyngeal Urine, Catheterized     Status: None   Collection Time: 03/04/20  7:54 PM   Specimen: Urine, Catheterized; Nasopharyngeal  Result Value Ref Range Status   SARS Coronavirus 2 NEGATIVE NEGATIVE Final    Comment: (NOTE) SARS-CoV-2 target nucleic acids are NOT DETECTED.  The SARS-CoV-2 RNA is generally detectable in upper and lower respiratory specimens during the acute phase of infection. Negative results do not preclude SARS-CoV-2 infection, do not rule out co-infections with other pathogens, and should not be used as the sole basis for treatment or other patient management decisions. Negative results must be combined with clinical observations, patient history, and epidemiological information. The expected result is Negative.  Fact Sheet for Patients: SugarRoll.be  Fact Sheet for Healthcare Providers: https://www.woods-mathews.com/  This test is not yet approved or cleared by the [place] FDA and  has been authorized for detection and/or diagnosis of SARS-CoV-2 by FDA under an Emergency Use Authorization (EUA). This EUA will remain  in effect (meaning this test can be used) for the duration of the COVID-19 declaration under Se ction 564(b)(1) of the Act, 21 U.S.C. section 360bbb-3(b)(1), unless the authorization is terminated or revoked sooner.  Performed at [facility], [place] [address] [place], [place] [address]   Urine culture     Status: None   Collection Time: 03/04/20  9:02 PM   Specimen: Urine, Random  Result Value Ref Range Status   Specimen Description URINE, RANDOM  Final   Special Requests NONE  Final   Culture   Final    NO GROWTH Performed at [facility], [address] [place], [place] [address]    Report Status 03/06/2020 FINAL  Final  Aerobic Culture w Gram Stain (superficial specimen)     Status: None   Collection  Time: 03/05/20  3:03 AM   Specimen: Wound  Result Value Ref Range Status   Specimen Description WOUND STOMACH  Final   Special Requests NONE  Final   Gram Stain   Final    RARE WBC PRESENT, PREDOMINANTLY PMN MODERATE GRAM POSITIVE COCCI Performed at [facility], [place] [address] [place], [place] [address]    Culture   Final    ABUNDANT METHICILLIN RESISTANT STAPHYLOCOCCUS AUREUS   Report Status 03/07/2020 FINAL  Final   Organism ID, Bacteria METHICILLIN RESISTANT STAPHYLOCOCCUS AUREUS  Final      Susceptibility   Methicillin resistant staphylococcus aureus - MIC*    CIPROFLOXACIN >=8 RESISTANT Resistant     ERYTHROMYCIN >=8 RESISTANT Resistant     GENTAMICIN <=0.5 SENSITIVE Sensitive     OXACILLIN >=4 RESISTANT Resistant     TETRACYCLINE <=1 SENSITIVE Sensitive     VANCOMYCIN 1 SENSITIVE Sensitive     TRIMETH/SULFA <=10 SENSITIVE Sensitive     CLINDAMYCIN <=0.25 SENSITIVE Sensitive     RIFAMPIN <=0.5 SENSITIVE Sensitive     Inducible Clindamycin NEGATIVE Sensitive     * ABUNDANT METHICILLIN RESISTANT STAPHYLOCOCCUS AUREUS  Surgical PCR screen     Status: Abnormal   Collection Time: 03/11/20  7:25 AM   Specimen: Nasal Mucosa; Nasal Swab  Result Value Ref Range Status   MRSA, PCR POSITIVE (A) NEGATIVE Final    Comment: RESULT CALLED TO, READ BACK BY AND VERIFIED WITH: RN G.SCALCL ON 03/11/2020 AT 0954 BY E.PARRISH    Staphylococcus aureus POSITIVE (A) NEGATIVE Final    Comment: (NOTE) The Xpert SA Assay (FDA approved for NASAL specimens in patients 60 years of age and older), is one component of a comprehensive surveillance program. It is not intended to diagnose infection nor to guide or monitor treatment. Performed at [facility], [place] [address] [place], [place] [address]     RADIOLOGY STUDIES/RESULTS: No results found.   LOS: 9 days   Oren Binet, MD  [facility]    To contact the attending provider between 7A-7P or the covering provider  during after hours 7P-7A, please log into the web site [email or web address] and access using universal [place] password for that web site. If you do not have the password, please call the hospital operator.  03/13/2020, 2:13 PM

## 2020-03-13 NOTE — Progress Notes (Signed)
Patient ID: Curtis Solis, male   DOB: Oct 10, 1955, 65 y.o.   MRN: [id number] No drainage in the wound VAC canister dressing is dry status post transmetatarsal amputation.  Patient may be touchdown weightbearing on the right heel.

## 2020-03-13 NOTE — Progress Notes (Signed)
Physical Therapy Treatment Patient Details Name: Curtis Solis MRN: [id number] DOB: Jul 13, 1955 Today's Date: 03/13/2020    History of Present Illness Pt is a 65 y.o. male admitted from [facility] 03/04/20 with AKI, hypotension, chronic urinary retention/open wound of penis, necrosis of R toes. 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 Pt with complicated PMH including AAA repair (09/2019) with multiple complications, including LLE ischemia resulting in L AKA, ischemic bowel (s/p ostomy placement), MI, ARF requiring CRRT, R foot ischemia; d/c to [facility] 11/25/19 with other hospitalizations since then, then to [facility] 02/25/20.   PT Comments    Pt progressing with mobility, remains motivated to participate despite significant pain with R foot in dependent position. Today's session focused focused on seated balance and lateral scoot transfers since attempts at squat/stand pivot transfer limited by pain. Pt remains limited by generalized weakness, decreased activity tolerance, and impaired balance strategies/postural reactions; at high risk for falls. Continue to recommend intensive CIR-level therapies to maximize functional mobility and independence prior to return home.   Post-transfer BP 142/78, HR 80s, SpO2 90-96% on RA    Follow Up Recommendations  CIR;Supervision for mobility/OOB     Equipment Recommendations  Wheelchair (measurements PT);Wheelchair cushion (measurements PT)    Recommendations for Other Services       Precautions / Restrictions Precautions Precautions: Fall;Other (comment) Precaution Comments: Ostomy bag, chronic foley catheter Required Braces or Orthoses: Other Brace Other Brace: R darco shoe for transfers only Restrictions Weight Bearing Restrictions: Yes Other Position/Activity Restrictions: RLE WB thru heel for transfers only    Mobility  Bed Mobility Overal bed mobility: Needs Assistance Bed Mobility: Supine to Sit     Supine to sit: Min assist;HOB  elevated     General bed mobility comments: Increased time and effort, use of bed rail to scoot hips to EOB; minA for HHA to elevate trunk    Transfers Overall transfer level: Needs assistance Equipment used: None Transfers: Lateral/Scoot Transfers          Lateral/Scoot Transfers: Min assist General transfer comment: Performed lateral scoot from bed to drop arm recliner; pt declined having darco shoe donned, opting to keep R foot off floor during transfer due to significant pain with R foot in dependent position; increased time and effort with frequent rest breaks; minA to maintain stability and assist scooting; assist for set-up; pt with difficulty fully clearing buttocks with each scoot. Deferred squat or stand pivot transfer attempt on R heel due to significant pain  Ambulation/Gait                 Stairs             Wheelchair Mobility    Modified Rankin (Stroke Patients Only)       Balance Overall balance assessment: Needs assistance   Sitting balance-Leahy Scale: Fair Sitting balance - Comments: Unable to accept significant challenge without LOB; requires assist to reach R foot sitting EOB                                    Cognition Arousal/Alertness: Awake/alert Behavior During Therapy: WFL for tasks assessed/performed Overall Cognitive Status: Within Functional Limits for tasks assessed                                 General Comments: WFL for simple tasks; not formally assessed  Exercises Other Exercises Other Exercises: Medbridge HEP handout provided (Access Code [email or web address]) - SLR, knee to chest, LAQ, ankle pumps    General Comments General comments (skin integrity, edema, etc.): C/o brief period of dizziness during transfer; post-transfer BP 142/78, HR 80s, SpO2 90-96% on RA      Pertinent Vitals/Pain Pain Assessment: Faces Faces Pain Scale: Hurts whole lot Pain Location: R foot/lower leg Pain  Descriptors / Indicators: Discomfort;Grimacing;Guarding Pain Intervention(s): Patient requesting pain meds-RN notified;Limited activity within patient's tolerance;Repositioned    Home Living                      Prior Function            PT Goals (current goals can now be found in the care plan section) Progress towards PT goals: Progressing toward goals    Frequency    Min 3X/week      PT Plan Current plan remains appropriate    Co-evaluation              AM-PAC PT "6 Clicks" Mobility   Outcome Measure  Help needed turning from your back to your side while in a flat bed without using bedrails?: A Little Help needed moving from lying on your back to sitting on the side of a flat bed without using bedrails?: A Little Help needed moving to and from a bed to a chair (including a wheelchair)?: A Little Help needed standing up from a chair using your arms (e.g., wheelchair or bedside chair)?: A Lot Help needed to walk in hospital room?: Total Help needed climbing 3-5 steps with a railing? : Total 6 Click Score: 13    End of Session   Activity Tolerance: Patient tolerated treatment well;Patient limited by pain Patient left: in chair;with call bell/phone within reach;with chair alarm set;with nursing/sitter in room Nurse Communication: Mobility status;Patient requests pain meds;Weight bearing status PT Visit Diagnosis: Other abnormalities of gait and mobility (R26.89);Muscle weakness (generalized) (M62.81);Pain Pain - Right/Left: Right Pain - part of body: Leg;Ankle and joints of foot     Time: [id number] PT Time Calculation (min) (ACUTE ONLY): 26 min  Charges:  $Therapeutic Exercise: 8-22 mins $Therapeutic Activity: 8-22 mins                     Mabeline Caras, PT, DPT Acute Rehabilitation Services  Pager [phone] Office [place] 03/13/2020, 3:53 PM

## 2020-03-13 NOTE — Plan of Care (Signed)
  Problem: Education: Goal: Knowledge of disease and its progression will improve Outcome: Progressing   Problem: Health Behavior/Discharge Planning: Goal: Ability to manage health-related needs will improve Outcome: Progressing   Problem: Nutritional: Goal: Ability to make healthy dietary choices will improve Outcome: Progressing   Problem: Education: Goal: Knowledge of General Education information will improve Description: Including pain rating scale, medication(s)/side effects and non-pharmacologic comfort measures Outcome: Progressing   Problem: Clinical Measurements: Goal: Ability to maintain clinical measurements within normal limits will improve Outcome: Progressing Goal: Diagnostic test results will improve Outcome: Progressing   Problem: Coping: Goal: Level of anxiety will decrease Outcome: Progressing   Problem: Elimination: Goal: Will not experience complications related to bowel motility Outcome: Progressing Goal: Will not experience complications related to urinary retention Outcome: Progressing   Problem: Pain Managment: Goal: General experience of comfort will improve Outcome: Progressing   Pt vitals stable throughout night. Pt was having constant throbbing in R leg with little relief from prn medications. Ostomy had a lot of output but the output began to slow down in the morning. Will continue to monitor

## 2020-03-14 ENCOUNTER — Other Ambulatory Visit: Payer: Self-pay | Admitting: Internal Medicine

## 2020-03-14 LAB — BASIC METABOLIC PANEL
Anion gap: 6 (ref 5–15)
BUN: 42 mg/dL — ABNORMAL HIGH (ref 8–23)
CO2: 20 mmol/L — ABNORMAL LOW (ref 22–32)
Calcium: 8.9 mg/dL (ref 8.9–10.3)
Chloride: 109 mmol/L (ref 98–111)
Creatinine, Ser: 2.07 mg/dL — ABNORMAL HIGH (ref 0.61–1.24)
GFR, Estimated: 36 mL/min — ABNORMAL LOW (ref >60)
Glucose, Bld: 80 mg/dL (ref 70–99)
Potassium: 4.9 mmol/L (ref 3.5–5.1)
Sodium: 135 mmol/L (ref 135–145)

## 2020-03-14 MED ORDER — MELATONIN 5 MG PO TABS: 5.0000 mg | ORAL_TABLET | Freq: Every day | ORAL | 0 refills | Status: AC

## 2020-03-14 MED ORDER — INSULIN ASPART 100 UNIT/ML ~~LOC~~ SOLN: SUBCUTANEOUS | 11 refills | Status: AC

## 2020-03-14 MED ORDER — DIPHENOXYLATE-ATROPINE 2.5-0.025 MG PO TABS: 1.0000 | ORAL_TABLET | Freq: Four times a day (QID) | ORAL | 0 refills | Status: AC

## 2020-03-14 MED ORDER — OXYCODONE HCL 10 MG PO TABS: 10.0000 mg | ORAL_TABLET | ORAL | 0 refills | Status: AC | PRN

## 2020-03-14 MED ORDER — LINEZOLID 600 MG PO TABS: 600.0000 mg | ORAL_TABLET | Freq: Two times a day (BID) | ORAL | 0 refills | Status: AC

## 2020-03-14 MED ORDER — NUTRISOURCE FIBER PO PACK: 1.0000 | PACK | Freq: Two times a day (BID) | ORAL | Status: AC

## 2020-03-14 MED ORDER — PROSOURCE TF PO LIQD: 45.0000 mL | Freq: Two times a day (BID) | ORAL | Status: AC

## 2020-03-14 MED ORDER — RENA-VITE PO TABS: 1.0000 | ORAL_TABLET | Freq: Every day | ORAL | 0 refills | Status: AC

## 2020-03-14 MED ORDER — VITAL AF 1.2 CAL PO LIQD: 237.0000 mL | Freq: Three times a day (TID) | ORAL | Status: AC

## 2020-03-14 MED ORDER — ZINC OXIDE 40 % EX OINT: TOPICAL_OINTMENT | Freq: Two times a day (BID) | CUTANEOUS | 0 refills | Status: AC

## 2020-03-14 NOTE — Progress Notes (Signed)
Inpatient Rehab Admissions Coordinator:    I met with this patient and discussed potential CIR admit. Pt. Was doing rehab for AKA at [facility] and they are ready to take him back today. I believe it is very unlikely that Pt.'s insurance will approve CIR for a transmetatarsal amputation and I likely would not hear a response from Pt.'s insurance until mid-week. I discussed this with Pt., LCSW, and MD and decision was made to d/c Pt. Back to [facility]. CIR will sign off.   Clemens Catholic, [place], [place] Admissions Coordinator  [phone] ([place]) [phone] (office)

## 2020-03-14 NOTE — Plan of Care (Signed)
  Problem: Education: Goal: Knowledge of disease and its progression will improve Outcome: Progressing   Problem: Health Behavior/Discharge Planning: Goal: Ability to manage health-related needs will improve Outcome: Progressing   Problem: Nutritional: Goal: Ability to make healthy dietary choices will improve Outcome: Progressing   Problem: Education: Goal: Knowledge of General Education information will improve Description: Including pain rating scale, medication(s)/side effects and non-pharmacologic comfort measures Outcome: Progressing   Problem: Health Behavior/Discharge Planning: Goal: Ability to manage health-related needs will improve Outcome: Progressing   Problem: Activity: Goal: Risk for activity intolerance will decrease Outcome: Progressing   Problem: Coping: Goal: Level of anxiety will decrease Outcome: Progressing   Problem: Pain Managment: Goal: General experience of comfort will improve Outcome: Progressing  No acute events overnight. Pain was controlled well with PRN medication. Vital signs remained stable. Will continue to monitor.

## 2020-03-14 NOTE — NC FL2 (Signed)
[place] LEVEL OF CARE SCREENING TOOL     IDENTIFICATION  Patient Name: Curtis Solis Birthdate: Nov 17, 1955 Sex: male Admission Date (Current Location): 03/04/2020  [facility] and [place] Number:  Herbalist and Address:  [facility] [place], [place] [address] [place], [place] [address]      Provider Number: [id number]  Attending Physician Name and Address:  Jonetta Osgood, MD  Relative Name and Phone Number:  [name] (spouse) [phone]    Current Level of Care: Hospital Recommended Level of Care: [place] Prior Approval Number:    Date Approved/Denied:   PASRR Number: [id number]  Discharge Plan: SNF    Current Diagnoses: Patient Active Problem List   Diagnosis Date Noted  . Osteomyelitis ([place])   . Acute renal disease   . AKI (acute kidney injury) ([place])   . PVD (peripheral vascular disease) ([place])   . Controlled type 2 diabetes mellitus with stage 4 chronic kidney disease, without long-term current use of insulin ([place]) 03/05/2020  . Hypotension 03/05/2020  . Urinary retention 03/04/2020  . 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 GERD without esophagitis 03/04/2020  . Hyponatremia 03/04/2020  . Severe protein-calorie malnutrition ([place]) 02/04/2020  . Acute renal failure with acute tubular necrosis superimposed on stage 4 chronic kidney disease ([place]) 01/31/2020  . Hyperkalemia 01/31/2020  . Uremia 01/31/2020  . Gangrene of right foot ([place]) 01/31/2020  . Ileostomy present ([place]) 01/31/2020  . Open abdominal wall wound, subsequent encounter 01/31/2020  . Open wound of penis 01/31/2020  . Acute lower UTI 01/31/2020  . Pressure injury of skin 01/31/2020  . S/P AKA (above knee amputation) unilateral, left ([place]) 10/23/2019  . 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 AAA (abdominal aortic aneurysm) ([place]) 10/06/2019  . CKD (chronic kidney disease)  stage 4, GFR 15-29 ml/min ([facility]) 10/06/2019    Orientation RESPIRATION BLADDER Height & Weight     Self,Time,Situation,Place  Normal Continent Weight: 145 lb 1 oz (65.8 kg) Height:  '5\' 11"'$  (180.3 cm)  BEHAVIORAL SYMPTOMS/MOOD NEUROLOGICAL BOWEL NUTRITION STATUS      Colostomy Diet (See DC summary)  AMBULATORY STATUS COMMUNICATION OF NEEDS Skin   Extensive Assist Verbally Other (Comment) (Stage 2 on buttocks, non-pressure wound left thigh, wound on right anterior toe, wound on right anterior thigh, wound on right heel)                       Personal Care Assistance Level of Assistance  Bathing,Feeding,Dressing Bathing Assistance: Maximum assistance Feeding assistance: Independent Dressing Assistance: Maximum assistance     Functional Limitations Info  Sight,Hearing,Speech Sight Info: Adequate Hearing Info: Adequate Speech Info: Adequate    SPECIAL CARE FACTORS FREQUENCY  PT (By licensed PT),OT (By licensed OT)                    Contractures      Additional Factors Info  Code Status,Allergies,Insulin Sliding Scale Code Status Info: FULL Allergies Info: NKA   Insulin Sliding Scale Info: See DC summary       Current Medications (03/14/2020):  This is the current hospital active medication list Current Facility-Administered Medications  Medication Dose Route Frequency Provider Last Rate Last Admin  . 0.9 %  sodium chloride infusion   Intravenous Continuous Persons, Bevely Palmer, [place] 10 mL/hr at 03/11/20 1401 Restarted at 03/11/20 1435  . 0.9 %  sodium chloride infusion  Intravenous Continuous Jonetta Osgood, MD 10 mL/hr at 03/12/20 1240 Rate Change at 03/12/20 1240  . acetaminophen (TYLENOL) tablet 1,000 mg  1,000 mg Oral Q8H Ghimire, Henreitta Leber, MD   1,000 mg at 03/14/20 0523  . atorvastatin (LIPITOR) tablet 40 mg  40 mg Oral Daily Persons, Bevely Palmer, [place]   40 mg at 03/14/20 [id number]  . calcium carbonate (TUMS - dosed in mg elemental calcium) chewable tablet 200 mg  of elemental calcium  1 tablet Oral Q8H PRN Persons, Bevely Palmer, [place]      . Chlorhexidine Gluconate Cloth 2 % PADS 6 each  6 each Topical Daily Persons, Bevely Palmer, [place]   6 each at 03/13/20 [id number]  . Darbepoetin Alfa (ARANESP) injection 40 mcg  40 mcg Subcutaneous Q Fri-1800 Persons, Bevely Palmer, [place]   40 mcg at 03/13/20 1715  . diphenhydrAMINE (BENADRYL) capsule 25 mg  25 mg Oral Q4H PRN Mansy, Jan A, MD   25 mg at 03/13/20 2121  . diphenoxylate-atropine (LOMOTIL) 2.5-0.025 MG per tablet 1 tablet  1 tablet Oral QID Persons, Bevely Palmer, [place]   1 tablet at 03/14/20 [phone]  . feeding supplement (PROSource TF) liquid 45 mL  45 mL Per Tube BID Jonetta Osgood, MD   45 mL at 03/14/20 0831  . feeding supplement (VITAL AF 1.2 CAL) liquid 237 mL  237 mL Per Tube TID Jonetta Osgood, MD   237 mL at 03/14/20 0830  . fentaNYL (SUBLIMAZE) injection 25 mcg  25 mcg Intravenous Q2H PRN Persons, Bevely Palmer, [place]   25 mcg at 03/12/20 1953  . fiber (NUTRISOURCE FIBER) 1 packet  1 packet Per Tube BID Jonetta Osgood, MD   1 packet at 03/14/20 [phone]  . fludrocortisone (FLORINEF) tablet 0.1 mg  0.1 mg Oral Daily Persons, Bevely Palmer, PA   0.1 mg at 03/14/20 0829  . gabapentin (NEURONTIN) capsule 200 mg  200 mg Oral QHS Persons, Bevely Palmer, PA   200 mg at 03/13/20 2121  . heparin injection 5,000 Units  5,000 Units Subcutaneous Q8H Persons, Bevely Palmer, PA   5,000 Units at 03/14/20 [id number]  . hydrOXYzine (ATARAX/VISTARIL) tablet 10 mg  10 mg Oral Q6H PRN Persons, Bevely Palmer, PA   10 mg at 03/09/20 [id number]  . insulin aspart (novoLOG) injection 0-9 Units  0-9 Units Subcutaneous Q4H Shela Leff, MD   2 Units at 03/12/20 2000  . ipratropium-albuterol (DUONEB) 0.5-2.5 (3) MG/3ML nebulizer solution 3 mL  3 mL Nebulization Q4H PRN Persons, Bevely Palmer, PA      . linezolid (ZYVOX) tablet 600 mg  600 mg Oral Q12H Tommy Medal, Lavell Islam, MD   600 mg at 03/14/20 [id number]  . liver oil-zinc oxide (DESITIN) 40 % ointment   Topical BID Persons, Bevely Palmer, [place]    Given at 03/13/20 2120  . melatonin tablet 5 mg  5 mg Oral QHS Persons, Bevely Palmer, [place]   5 mg at 03/12/20 2104  . metoCLOPramide (REGLAN) tablet 5-10 mg  5-10 mg Oral Q8H PRN Persons, Bevely Palmer, PA       Or  . metoCLOPramide (REGLAN) injection 5-10 mg  5-10 mg Intravenous Q8H PRN Persons, Bevely Palmer, [place]      . multivitamin (RENA-VIT) tablet 1 tablet  1 tablet Oral QHS Persons, Bevely Palmer, [place]   1 tablet at 03/13/20 2121  . mupirocin ointment (BACTROBAN) 2 % 1 application  1 application Nasal BID Persons, Bevely Palmer, [place]   1 application at [id number]  2120  . ondansetron (ZOFRAN) tablet 4 mg  4 mg Oral Q6H PRN Persons, Bevely Palmer, PA       Or  . ondansetron [facility]) injection 4 mg  4 mg Intravenous Q6H PRN Persons, Bevely Palmer, PA   4 mg at 03/11/20 1415  . oxyCODONE (Oxy IR/ROXICODONE) immediate release tablet 10-15 mg  10-15 mg Oral Q4H PRN Jonetta Osgood, MD   10 mg at 03/14/20 0830  . pantoprazole (PROTONIX) EC tablet 20 mg  20 mg Oral Daily Persons, Bevely Palmer, [place]   20 mg at 03/14/20 [id number]  . sodium bicarbonate tablet 1,300 mg  1,300 mg Oral BID Persons, Bevely Palmer, [place]   1,300 mg at 03/14/20 [id number]     Discharge Medications: Please see discharge summary for a list of discharge medications.  Relevant Imaging Results:  Relevant Lab Results:   Additional Information SSN [id number], [place]

## 2020-03-14 NOTE — Plan of Care (Signed)
  Problem: Education: Goal: Knowledge of disease and its progression will improve Outcome: Adequate for Discharge   Problem: Fluid Volume: Goal: Compliance with measures to maintain balanced fluid volume will improve Outcome: Adequate for Discharge   Problem: Health Behavior/Discharge Planning: Goal: Ability to manage health-related needs will improve Outcome: Adequate for Discharge   Problem: Nutritional: Goal: Ability to make healthy dietary choices will improve Outcome: Adequate for Discharge   Problem: Clinical Measurements: Goal: Complications related to the disease process, condition or treatment will be avoided or minimized Outcome: Adequate for Discharge   Problem: Education: Goal: Knowledge of General Education information will improve Description: Including pain rating scale, medication(s)/side effects and non-pharmacologic comfort measures Outcome: Adequate for Discharge   Problem: Health Behavior/Discharge Planning: Goal: Ability to manage health-related needs will improve Outcome: Adequate for Discharge   Problem: Clinical Measurements: Goal: Ability to maintain clinical measurements within normal limits will improve Outcome: Adequate for Discharge Goal: Diagnostic test results will improve Outcome: Adequate for Discharge   Problem: Activity: Goal: Risk for activity intolerance will decrease Outcome: Adequate for Discharge   Problem: Coping: Goal: Level of anxiety will decrease Outcome: Adequate for Discharge   Problem: Elimination: Goal: Will not experience complications related to bowel motility Outcome: Adequate for Discharge Goal: Will not experience complications related to urinary retention Outcome: Adequate for Discharge   Problem: Pain Managment: Goal: General experience of comfort will improve Outcome: Adequate for Discharge   Problem: Safety: Goal: Ability to remain free from injury will improve Outcome: Adequate for Discharge   Problem:  Skin Integrity: Goal: Risk for impaired skin integrity will decrease Outcome: Adequate for Discharge

## 2020-03-14 NOTE — TOC Transition Note (Signed)
Transition of Care [facility]) - CM/SW Discharge Note   Patient Details  Name: Curtis Solis MRN: [id number] Date of Birth: 06-29-1955  Transition of Care [facility]) CM/SW Contact:  [address], [place] Phone Number: 03/14/2020, 11:27 AM   Clinical Narrative:    Patient will DC to: [place] Anticipated DC date: 03/14/20 Family notified: Cristela Felt Transport by: Corey Harold   Per MD patient ready for DC to . RN to call report prior to discharge [id number], room 219, ask for [facility]. RN, patient, patient's family, and facility notified of DC. Discharge Summary and FL2 sent to facility. DC packet on chart. Ambulance transport requested for patient.   CSW will sign off for now as social work intervention is no longer needed. Please consult [place] again if new needs arise.     Final next level of care: Skilled Nursing Facility Barriers to Discharge: Continued Medical Work up   Patient Goals and CMS Choice Patient states their goals for this hospitalization and ongoing recovery are:: Feel better CMS Medicare.gov Compare Post Acute Care list provided to:: Patient Choice offered to / list presented to : Patient  Discharge Placement                       Discharge Plan and Services In-house Referral: Clinical Social Work   Post Acute Care Choice: [place]                               Social Determinants of Health (SDOH) Interventions     Readmission Risk Interventions No flowsheet data found.

## 2020-03-14 NOTE — Progress Notes (Signed)
Patient discharged from unit at this time via transport. IV site removed, catheter intact, pressure dressing applied. All patient belongings in disposition of the patient. Discharge paperwork given to transport team. No further needs at this time.

## 2020-03-14 NOTE — Progress Notes (Signed)
Report called to Janett Billow, Therapist, sports at [facility] facility.

## 2020-03-14 NOTE — Discharge Summary (Signed)
PATIENT DETAILS Name: Curtis Solis Age: 65 y.o. Sex: male Date of Birth: 1955/11/19 MRN: [id number]. Admitting Physician: Vernelle Emerald, MD [id number], Denton Ar, MD  Admit Date: 03/04/2020 Discharge date: 03/14/2020  Recommendations for Outpatient Follow-up:  1. Follow up with PCP in 1-2 weeks 2. Please obtain CMP/CBC in one week 3. Please ensure follow up with Dr. Shirlee Latch [facility] canister in place until seen by Dr. Sharol Given 4. Please continue to monitor ostomy output closely.  Admitted From:  SNF  Disposition: SNF   Home Health: No  Equipment/Devices: Wound VAC  Discharge Condition: Stable  CODE STATUS: FULL CODE  Diet recommendation:  Diet Order            Diet - low sodium heart healthy           Diet Carb Modified Fluid consistency: Thin; Room service appropriate? Yes  Diet effective now                  Brief Narrative: Patient is a 65 y.o. 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  Significant events: 2/23>> admit from SNF for evaluation of AKI, hyperkalemia, hyponatremia and right foot gangrene.  Significant studies: 2/23>> chest x-ray: No active disease 2/23>> renal ultrasound: Multiple bilateral simple renal cysts-no hydronephrosis. 2/24 >> CT abdomen/pelvis: No evidence of bowel obstruction, no hydronephrosis 2/24>> MRI right foot: Osteo throughout the second through fifth toes.   Antimicrobial therapy: Meropenem: 2/23>> 2/28 Clindamycin: 2/23>> 2/26 Vancomycin: 2/23>> 2/25  Microbiology data: 2/23>> blood culture: No growth 2/23>> urine culture: No growth 2/24>> wound abdomen swab: MRSA  Procedures  : 3/2>> right transmetatarsal amputation   Consults: ID Nephrology Orthopedics  Brief Hospital Course: AKI on CKD stage IV: AKI likely ischemic ATN in the setting of high ostomy output and hypotension. Creatinine has improved with supportive care-creatinine now close to baseline.   Right foot gangrene with underlying osteomyelitis: S/p transmetatarsal amputation on 3/2-ID recommending 2 weeks of oral Zyvox.    Spoke with Dr. Sharol Given on 3/5-okay for discharge-keep wound VAC in place until seen by him in 1 week.  Patient may be touchdown weightbearing to right heel.  Hypotension: Resolved-initially on stress dose hydrocortisone-a.m. cortisol level appropriate-not thought to have adrenal insufficiency-has been resumed on home dosing of Florinef.  Hypomagnesemia: Repleted  Hyponatremia: Due to dehydration-resolved with IV fluids.  Normocytic anemia: Secondary to AKI/CKD/chronic wounds-transfuse if hemoglobin<7.  No evidence of GI bleeding.  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    High output ostomy:  Had significantly ostomy output-seems to be a chronic issue-has very good oral intake as well-we have adjusted his tube regimen-has been started on Lomotil-output of only 750 cc in the past 24 hours.  Continue close monitoring at SNF-if he has significant ostomy output-May need IVF at SNF or may need to be started on other therapies like Imodium and octreotide.   DM-2: CBG stable-continue SSI  Severe protein calorie malnutrition: PEG tube in place-on tube feedings.  Evaluated by dietitian during this hospital stay.  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-AKI requiring CRRT-cardiac arrest-pericardial effusion requiring pericardial drain placement-mesenteric ischemia requiring  small bowel  resection-s/p PEG tube placement-s/p chronic indwelling Foley catheter  Nutrition Problem: Nutrition Problem: Severe Malnutrition Etiology: chronic illness (Open abdominal wound s/p small bowel resection) Signs/Symptoms: severe muscle depletion,severe fat depletion,percent weight loss Percent weight loss: 33 % Interventions: Nepro shake,MVI,Tube feeding  RN pressure injury documentation: Pressure Injury 01/31/20 Buttocks Bilateral Stage 2 -  Partial thickness loss of dermis presenting as a shallow open injury with a red, pink wound bed without slough. (Active)  01/31/20 1600  Location: Buttocks  Location Orientation: Bilateral  Staging: Stage 2 -  Partial thickness loss of dermis presenting as a shallow open injury with a red, pink wound bed without slough.  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    Discharge Instructions    Diet - low sodium heart healthy   Complete by: As directed    Discharge wound care:   Complete by: As directed    1.Negative Pressure Wound Therapy - Incisional      Comments: Show patient how to attach vac    2.Wound care  Daily      Comments:  Gently cleanse the abdominal wound with NS pat dry and cover with Xeroform gauze, cover with ABD pad and secure with Medipor tape    3.Wound care  Daily      Comments: Gently cleanse the right upper lateral thigh with NS and pat dry. Apply Xeroform gauze, cover with ABD pad and secure with Medipor tape.   4.Wound care  Daily      Comments: Gently cleanse the right foot with soap and water, rinse and pat dry or allow to air dry. Paint the toes and the heel with betadine/iodine, cover the heel with a 4x4 then wrap the entire foot with a Kerlix.    5.Wound care  Daily      Comments: Clean the buttocks with soap and water, rinse and pat dry. Apply Desitin twice daily or PRN soiling.   Increase activity slowly   Complete by: As directed    Negative Pressure Wound Therapy - Incisional   Complete by: As directed    Show patient how to attach vac     Allergies as of 03/14/2020   No Known Allergies     Medication List    STOP taking these medications   free water Soln   Lokelma 5 g packet Generic drug: sodium zirconium cyclosilicate   metoprolol tartrate 25 MG tablet Commonly known as: LOPRESSOR   NON FORMULARY   oxyCODONE 5 MG/5ML solution Commonly known as: ROXICODONE Replaced by: Oxycodone HCl 10 MG Tabs   traMADol 50 MG tablet Commonly known as: ULTRAM     TAKE  these medications   acetaminophen 500 MG tablet Commonly known as: TYLENOL Take 1,000 mg by mouth every 6 (six) hours as needed for mild pain (or headaches).   atorvastatin 40 MG tablet Commonly known as: LIPITOR Take 40 mg by mouth daily at 6 PM.   calcium carbonate 500 MG chewable tablet Commonly known as: TUMS - dosed in mg elemental calcium Chew 1 tablet by mouth every 8 (eight) hours as needed for indigestion or heartburn.   carboxymethylcellulose 0.5 % Soln Commonly known as: REFRESH PLUS Place 2 drops into both eyes every 12 (twelve) hours as needed (for dryness).   diphenoxylate-atropine 2.5-0.025 MG  tablet Commonly known as: LOMOTIL Take 1 tablet by mouth 4 (four) times daily.   feeding supplement (PROSource TF) liquid Place 45 mLs into feeding tube 2 (two) times daily. What changed: when to take this   feeding supplement (VITAL AF 1.2 CAL) Liqd Place 237 mLs into feeding tube 3 (three) times daily. What changed:   how much to take  when to take this   fiber Pack packet Place 1 packet into feeding tube 2 (two) times daily.   fludrocortisone 0.1 MG tablet Commonly known as: FLORINEF Take 0.1 mg by mouth daily.   gabapentin 100 MG capsule Commonly known as: NEURONTIN Take 200 mg by mouth at bedtime.   insulin aspart 100 UNIT/ML injection Commonly known as: novoLOG 0-9 Units, Subcutaneous, Every 4 hours CBG 70 - 120: 0 units CBG 121 - 150: 1 unit CBG 151 - 200: 2 units CBG 201 - 250: 3 units CBG 251 - 300: 5 units CBG 301 - 350: 7 units CBG 351 - 400: 9 units CBG > 400: call MD   ipratropium-albuterol 0.5-2.5 (3) MG/3ML Soln Commonly known as: DUONEB Take 3 mLs by nebulization every 6 (six) hours as needed (wheezing/cough/shortness of breath).   linezolid 600 MG tablet Commonly known as: ZYVOX Take 1 tablet (600 mg total) by mouth 2 (two) times daily for 13 days. Continue through 3/13 to complete 2 weeks   liver oil-zinc oxide 40 % ointment Commonly known as: DESITIN Apply topically 2 (two) times daily. Apply to buttocks twice daily or PRN soiling   melatonin 5 MG Tabs Take 1 tablet (5 mg total) by mouth at bedtime.   multivitamin Tabs tablet Take 1 tablet by mouth at bedtime.   ondansetron 4 MG tablet Commonly known as: ZOFRAN Take 4 mg by mouth every 4 (four) hours as needed for nausea or vomiting.   Oxycodone HCl 10 MG Tabs Take 1-1.5 tablets (10-15 mg total) by mouth every 4 (four) hours as needed for moderate pain. Replaces: oxyCODONE 5 MG/5ML solution   pantoprazole 20 MG tablet Commonly known as: PROTONIX Take 20 mg by mouth daily before  breakfast.   simethicone 80 MG chewable tablet Commonly known as: MYLICON Chew 80 mg by mouth every 8 (eight) hours as needed for flatulence.   sodium bicarbonate 650 MG tablet Take 2 tablets (1,300 mg total) by mouth 2 (two) times daily.            Discharge Care Instructions  (From admission, onward)         Start     Ordered   03/14/20 0000  Discharge wound care:       Comments: 1.Negative Pressure Wound Therapy - Incisional      Comments: Show patient how to attach vac    2.Wound care  Daily      Comments: Gently cleanse the  abdominal wound with NS pat dry and cover with Xeroform gauze, cover with ABD pad and secure with Medipor tape    3.Wound care  Daily      Comments: Gently cleanse the right upper lateral thigh with NS and pat dry. Apply Xeroform gauze, cover with ABD pad and secure with Medipor tape.   4.Wound care  Daily      Comments: Gently cleanse the right foot with soap and water, rinse and pat dry or allow to air dry. Paint the toes and the heel with betadine/iodine, cover the heel with a 4x4 then wrap the entire foot with a Kerlix.    5.Wound care  Daily      Comments: Clean the buttocks with soap and water, rinse and pat dry. Apply Desitin twice daily or PRN soiling.   03/14/20 0935          Follow-up Information    Suzan Slick, NP In 1 week.   Specialty: Orthopedic Surgery Contact information: [place] [address] [phone]        [name], MD. Schedule an appointment as soon as possible for a visit in 1 week(s).   Specialty: Internal Medicine Contact information: [address] [place] [address] [phone]              No Known Allergies    Other Procedures/Studies: CT ABDOMEN PELVIS WO CONTRAST  Result Date: 03/05/2020 CLINICAL DATA:  65 year old male with abnormal labs. Recurrent hyponatremia, hyperkalemia, elevated creatinine. History of complicated AAA repair last year.  EXAM: CT ABDOMEN AND PELVIS WITHOUT CONTRAST TECHNIQUE: Multidetector CT imaging of the abdomen and pelvis was performed following the standard protocol without IV contrast. COMPARISON:  Renal ultrasound 03/04/2020. FINDINGS: Lower chest: No cardiomegaly or pericardial effusion. Lung bases are negative aside from mild linear atelectasis or scarring. Hepatobiliary: Streak artifact from the patient's upper extremities limits detail of the noncontrast liver. The gallbladder is contracted with internal stones or hyperdense sludge (series 3, image 23). No pericholecystic inflammation. No perihepatic free fluid. Pancreas: Negative. Spleen: Spleen size is at the upper limits of normal. No perisplenic fluid or discrete splenic lesion. Adrenals/Urinary Tract: Adrenal glands are within normal limits. Kidneys appear nonobstructed with no hydronephrosis, although there is mildly asymmetric perinephric inflammatory stranding, greater on the left and mild to moderate. Superimposed right upper pole simple fluid density renal cyst. Smaller left upper and lower pole similar cysts. Punctate right nephrolithiasis (coronal image 74). Right renal vascular calcifications also. No perinephric fluid collection. Proximal ureters are decompressed. Distal ureters are difficult to delineate. Foley catheter within the urinary bladder which is diminutive and mildly thick-walled (5 mm) with questionable perivesical inflammatory stranding. Stomach/Bowel: Decompressed rectum is indistinct. Sigmoid colon and descending colon are decompressed. Transverse colon is decompressed, and there is a right abdominal ostomy which appears to be a right colon colostomy. No dilated small bowel. Percutaneous gastrostomy tube in place. Mild gas and fluid in the stomach and duodenum. No free air. No free fluid. Aside from presacral stranding, no mesenteric stranding is identified in the abdomen. Vascular/Lymphatic: Highly abnormal appearance of the distal abdominal  aorta, aortoiliac bifurcation and proximal iliac arteries. 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 No periaortic hematoma or inflammatory stranding. Vascular patency is not evaluated in the  absence of IV contrast. No lymphadenopathy. Reproductive: Urethral catheter in place, otherwise negative. Other: Nonspecific moderate presacral stranding in the pelvis (series 3, image 65). Musculoskeletal: No acute osseous abnormality identified. IMPRESSION: 1. Nonspecific perinephric inflammatory stranding but no obstructive uropathy. Pyelonephritis cannot be evaluated on CT in the absence of IV contrast. There is no perinephric fluid collection. Punctate right nephrolithiasis noted. 2. Foley catheter in the urinary bladder with mild circumferential bladder wall thickening and questionable perivesical stranding. Consider cystitis/UTI. 3. Superimposed presacral inflammatory stranding in the pelvis, although nonspecific. Adjacent rectum is decompressed, without strong evidence of distal colitis. 4. Right abdominal ostomy with no evidence of bowel obstruction. Gastrostomy tube in place. 5. Abnormal aortoiliac bifurcation where a densely rim calcified, bifurcated aneurysm sac measures up to 50 mm diameter. Evidence of underlying prior surgical aortoiliac repair/bypass. No periaortic hematoma or inflammation. Vascular patency is not evaluated in the absence of IV contrast. 6. Gallbladder contracted around stones or high density bile with no evidence of inflammation. Electronically Signed   By: Genevie Ann M.D.   On: 03/05/2020 04:39   DG Chest 1 View  Result Date: 03/04/2020 CLINICAL DATA:  Abnormal labs and hypotension EXAM: CHEST  1 VIEW COMPARISON:  None. FINDINGS: The heart size and mediastinal contours are  within normal limits. Aortic atherosclerosis. Both lungs are clear. The visualized skeletal structures are unremarkable. IMPRESSION: No active disease. Electronically Signed   By: Donavan Foil M.D.   On: 03/04/2020 23:53   US RENAL  Result Date: 03/04/2020 CLINICAL DATA:  Acute renal disease. EXAM: RENAL / URINARY TRACT ULTRASOUND COMPLETE COMPARISON:  None. FINDINGS: Right Kidney: Renal measurements: 10.9 cm x 4.4 cm x 4.3 cm = volume: 106.26 mL. Echogenicity within normal limits. A 3.3 cm x 2.8 cm x 2.4 cm anechoic structure is seen within the upper pole of the right kidney. No abnormal flow is seen within this region on color Doppler evaluation. No hydronephrosis is visualized. Left Kidney: Renal measurements: 11.0 cm x 5.4 cm x 4.2 cm = volume: 130.80 mL. Echogenicity within normal limits. 2.8 cm x 2.0 cm x 2.1 cm and 1.8 cm x 1.5 cm x 1.7 cm anechoic structures are seen within the lower pole of the left kidney. An additional 2.1 cm x 1.9 cm x 2.5 cm anechoic structure is seen within the upper pole of the left kidney. No abnormal flow is seen within these regions on color Doppler evaluation. No hydronephrosis is visualized. Bladder: Limited in evaluation secondary to the presence of overlying bandages. Other: Of incidental note is the presence of a 4.3 cm hypoechoic area seen in between the spleen and left kidney. This area could not be duplicated clearly in transverse plane, as per the ultrasound technologist. IMPRESSION: Multiple bilateral simple renal cysts. Electronically Signed   By: Virgina Norfolk M.D.   On: 03/04/2020 22:39   MR FOOT RIGHT WO CONTRAST  Result Date: 03/05/2020 CLINICAL DATA:  Diabetic patient with a chronic skin ulcerations on the right foot. EXAM: MRI OF THE RIGHT FOREFOOT WITHOUT CONTRAST TECHNIQUE: Multiplanar, multisequence MR imaging of the right forefoot was performed. No intravenous contrast was administered. COMPARISON:  Plain films right foot 03/05/2020 and 01/31/2020.  FINDINGS: Bones/Joint/Cartilage Abnormal marrow signal is seen throughout the second through fifth toes. There is patchy marrow edema in the proximal phalanx of the great toe and base of the distal phalanx of the great toe. Also seen is patchy marrow edema in the head and neck of the first metatarsal. Mild marrow  edema is seen throughout the remainder of the first metatarsal. There is a remote infarct in the head of the second metatarsal. Patchy, mild marrow edema is seen in the heads of the third, fourth and fifth metatarsals. Abnormal marrow signal in the proximal diaphysis of the fifth metatarsal is also identified and could be due to infarct or osteomyelitis; osteomyelitis is favored. There is also abnormal signal in the calcaneus consistent with a medullary infarct. Very mild, patchy air edema in the bones of the midfoot is identified. No joint effusion. Ligaments Intact. Muscles and Tendons Intermediate increased T2 signal in all imaged musculature is most consistent with diabetic myopathy. Soft tissues There is some subcutaneous edema about the foot. No abscess. Extensive bandaging is noted. IMPRESSION: Findings most consistent with osteomyelitis throughout the second through fifth toes. Patchy marrow edema in the proximal and distal phalanges of the great toe and head of the first, third, fourth and fifth is also likely due to osteomyelitis. 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 Abnormal signal in the proximal diaphysis of the fifth metatarsal has an appearance more in keeping with medullary infarct than osteomyelitis. Mild, patchy marrow edema about the bones of the midfoot is not typical of osteomyelitis and may be due to stress or early neuropathic change. Negative for abscess or septic joint. Electronically Signed   By: Inge Rise M.D.   On: 03/05/2020 08:40   DG Foot Complete Right  Addendum Date: 03/05/2020   ADDENDUM REPORT: 03/05/2020 00:48 ADDENDUM:  Critical Value/emergent results were called by telephone at the time of interpretation on 03/05/2020 at 12:48 am to provider Pollina MD, who verbally acknowledged these results. Electronically Signed   By: Lovena Le M.D.   On: 03/05/2020 00:48   Result Date: 03/05/2020 CLINICAL DATA:  Osteomyelitis EXAM: RIGHT FOOT COMPLETE - 3+ VIEW COMPARISON:  Radiograph 01/31/2020 FINDINGS: Increasingly conspicuous lucency involving the head of the fifth metacarpal and base of the fifth proximal phalanx with adjacent focal soft tissue swelling and linear lucency which may reflect a site of laceration or ulcerative change. Poorly delineated cortical margins seen along the base of the third and possibly fourth proximal phalangeal bases as well. More diffuse mild soft tissue swelling of the forefoot. More irregular ulcerative change in potential soft tissue gas is seen involving the tips of the first through fourth digits some of which may be associated with the nail beds. Bones are diffusely demineralized which significantly limits detection of subtle osseous abnormalities and injuries. Stable hallux valgus deformity with advanced arthrosis at the first MTP. Additional degenerative changes seen throughout the foot elsewhere. Small ankle joint effusion is noted. Plantar calcaneal spurring. IMPRESSION: 1. Increasingly conspicuous lucency involving the head of the fifth metacarpal and base of the fifth proximal phalanx concerning for osteomyelitis with adjacent focal soft tissue swelling and linear lucency which may reflect a site of laceration or ulcerative change. 2. Poorly delineated cortical margins along the base of the third and possibly fourth proximal phalangeal bases. Could reflect additional site of osteomyelitis. 3. Extensive soft tissue swelling of the forefoot with some more conspicuous irregularity of the soft tissues involving the tips of the first through fourth digits. Recommend close clinical assessment to  assess for aggressive soft tissue infection. 4. 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 5. Ankle effusion without gross traumatic malalignment. Currently attempting to contact the ordering provider with a critical value result. Addendum will be  submitted upon case discussion. Electronically Signed: By: Lovena Le M.D. On: 03/05/2020 00:44     TODAY-DAY OF DISCHARGE:  Subjective:   Curtis Solis today has no headache,no chest abdominal pain,no new weakness tingling or numbness, feels much better wants to go home today.   Objective:   Blood pressure (!) 146/85, pulse 69, temperature 98.4 F (36.9 C), temperature source Axillary, resp. rate 12, height '5\' 11"'$  (1.803 m), weight 65.8 kg, SpO2 98 %.  Intake/Output Summary (Last 24 hours) at 03/14/2020 0937 Last data filed at 03/14/2020 0900 Gross per 24 hour  Intake 724.21 ml  Output 3200 ml  Net -2475.79 ml   Filed Weights   03/11/20 1233 03/12/20 0332 03/14/20 0347  Weight: 53.7 kg 67.6 kg 65.8 kg    Exam: Awake Alert, Oriented *3, No new F.N deficits, Normal affect [place].AT,PERRAL Supple Neck,No JVD, No cervical lymphadenopathy appriciated.  Symmetrical Chest wall movement, Good air movement bilaterally, CTAB RRR,No Gallops,Rubs or new Murmurs, No Parasternal Heave +ve B.Sounds, Abd Soft, Non tender, No organomegaly appriciated, No rebound -guarding or rigidity. No Cyanosis, Clubbing or edema, No new Rash or bruise   PERTINENT RADIOLOGIC STUDIES: No results found.   PERTINENT LAB RESULTS: CBC: Recent Labs    03/12/20 0025  WBC 18.2*  HGB 8.4*  HCT 26.5*  PLT 290   CMET CMP     Component Value Date/Time   NA 135 03/14/2020 0722   K 4.9 03/14/2020 0722   CL 109 03/14/2020 0722   CO2 20 (L) 03/14/2020 0722   GLUCOSE 80 03/14/2020 0722   BUN 42 (H) 03/14/2020 0722   CREATININE 2.07 (H) 03/14/2020 0722   CALCIUM 8.9 03/14/2020 0722   PROT 5.1 (L)  03/11/2020 0135   ALBUMIN 2.1 (L) 03/11/2020 0135   AST 25 03/11/2020 0135   ALT 30 03/11/2020 0135   ALKPHOS 40 03/11/2020 0135   BILITOT 1.0 03/11/2020 0135   GFRNONAA 36 (L) 03/14/2020 0722    GFR Estimated Creatinine Clearance: 34.4 mL/min (A) (by C-G formula based on SCr of 2.07 mg/dL (H)). No results for input(s): LIPASE, AMYLASE in the last 72 hours. No results for input(s): CKTOTAL, CKMB, CKMBINDEX, TROPONINI in the last 72 hours. Invalid input(s): POCBNP No results for input(s): DDIMER in the last 72 hours. No results for input(s): HGBA1C in the last 72 hours. No results for input(s): CHOL, HDL, LDLCALC, TRIG, CHOLHDL, LDLDIRECT in the last 72 hours. No results for input(s): TSH, T4TOTAL, T3FREE, THYROIDAB in the last 72 hours.  Invalid input(s): FREET3 No results for input(s): VITAMINB12, FOLATE, FERRITIN, TIBC, IRON, RETICCTPCT in the last 72 hours. Coags: No results for input(s): INR in the last 72 hours.  Invalid input(s): PT Microbiology: Recent Results (from the past 240 hour(s))  Blood culture (routine x 2)     Status: None   Collection Time: 03/04/20  7:05 PM   Specimen: BLOOD  Result Value Ref Range Status   Specimen Description BLOOD SITE NOT SPECIFIED  Final   Special Requests   Final    BOTTLES DRAWN AEROBIC AND ANAEROBIC Blood Culture results may not be optimal due to an inadequate volume of blood received in culture bottles   Culture   Final    NO GROWTH 5 DAYS Performed at [facility], [place] [address] [place], [place] [address]    Report Status 03/09/2020 FINAL  Final  Blood culture (routine x 2)     Status: None   Collection Time: 03/04/20  7:10  PM   Specimen: BLOOD  Result Value Ref Range Status   Specimen Description BLOOD BLOOD LEFT FOREARM  Final   Special Requests   Final    BOTTLES DRAWN AEROBIC AND ANAEROBIC Blood Culture results may not be optimal due to an inadequate volume of blood received in culture bottles   Culture   Final     NO GROWTH 5 DAYS Performed at [facility], [place] [address], [place], [place] [address]    Report Status 03/09/2020 FINAL  Final  SARS CORONAVIRUS 2 (TAT 6-24 HRS) Nasopharyngeal Urine, Catheterized     Status: None   Collection Time: 03/04/20  7:54 PM   Specimen: Urine, Catheterized; Nasopharyngeal  Result Value Ref Range Status   SARS Coronavirus 2 NEGATIVE NEGATIVE Final    Comment: (NOTE) SARS-CoV-2 target nucleic acids are NOT DETECTED.  The SARS-CoV-2 RNA is generally detectable in upper and lower respiratory specimens during the acute phase of infection. Negative results do not preclude SARS-CoV-2 infection, do not rule out co-infections with other pathogens, and should not be used as the sole basis for treatment or other patient management decisions. Negative results must be combined with clinical observations, patient history, and epidemiological information. The expected result is Negative.  Fact Sheet for Patients: SugarRoll.be  Fact Sheet for Healthcare Providers: https://www.woods-mathews.com/  This test is not yet approved or cleared by the [place] FDA and  has been authorized for detection and/or diagnosis of SARS-CoV-2 by FDA under an Emergency Use Authorization (EUA). This EUA will remain  in effect (meaning this test can be used) for the duration of the COVID-19 declaration under Se ction 564(b)(1) of the Act, 21 U.S.C. section 360bbb-3(b)(1), unless the authorization is terminated or revoked sooner.  Performed at [facility], [place] [address], [place], [place] [address]   Urine culture     Status: None   Collection Time: 03/04/20  9:02 PM   Specimen: Urine, Random  Result Value Ref Range Status   Specimen Description URINE, RANDOM  Final   Special Requests NONE  Final   Culture   Final    NO GROWTH Performed at [facility], [place] [address], [place], [place] [address]    Report Status  03/06/2020 FINAL  Final  Aerobic Culture w Gram Stain (superficial specimen)     Status: None   Collection Time: 03/05/20  3:03 AM   Specimen: Wound  Result Value Ref Range Status   Specimen Description WOUND STOMACH  Final   Special Requests NONE  Final   Gram Stain   Final    RARE WBC PRESENT, PREDOMINANTLY PMN MODERATE GRAM POSITIVE COCCI Performed at [facility], [place] [address], [place],  [address]    Culture   Final    ABUNDANT METHICILLIN RESISTANT STAPHYLOCOCCUS AUREUS   Report Status 03/07/2020 FINAL  Final   Organism ID, Bacteria METHICILLIN RESISTANT STAPHYLOCOCCUS AUREUS  Final      Susceptibility   Methicillin resistant staphylococcus aureus - MIC*    CIPROFLOXACIN >=8 RESISTANT Resistant     ERYTHROMYCIN >=8 RESISTANT Resistant     GENTAMICIN <=0.5 SENSITIVE Sensitive     OXACILLIN >=4 RESISTANT Resistant     TETRACYCLINE <=1 SENSITIVE Sensitive     VANCOMYCIN 1 SENSITIVE Sensitive     TRIMETH/SULFA <=10 SENSITIVE Sensitive     CLINDAMYCIN <=0.25 SENSITIVE Sensitive     RIFAMPIN <=0.5 SENSITIVE Sensitive     Inducible Clindamycin NEGATIVE Sensitive     *  ABUNDANT METHICILLIN RESISTANT STAPHYLOCOCCUS AUREUS  Surgical PCR screen     Status: Abnormal   Collection Time: 03/11/20  7:25 AM   Specimen: Nasal Mucosa; Nasal Swab  Result Value Ref Range Status   MRSA, PCR POSITIVE (A) NEGATIVE Final    Comment: RESULT CALLED TO, READ BACK BY AND VERIFIED WITH: RN G.SCALCL ON 03/11/2020 AT 0954 BY E.PARRISH    Staphylococcus aureus POSITIVE (A) NEGATIVE Final    Comment: (NOTE) The Xpert SA Assay (FDA approved for NASAL specimens in patients 55 years of age and older), is one component of a comprehensive surveillance program. It is not intended to diagnose infection nor to guide or monitor treatment. Performed at [facility], [place] [address], [place], [place] [address]   SARS CORONAVIRUS 2 (TAT 6-24 HRS) Nasopharyngeal Nasopharyngeal Swab     Status:  None   Collection Time: 03/13/20  2:19 PM   Specimen: Nasopharyngeal Swab  Result Value Ref Range Status   SARS Coronavirus 2 NEGATIVE NEGATIVE Final    Comment: (NOTE) SARS-CoV-2 target nucleic acids are NOT DETECTED.  The SARS-CoV-2 RNA is generally detectable in upper and lower respiratory specimens during the acute phase of infection. Negative results do not preclude SARS-CoV-2 infection, do not rule out co-infections with other pathogens, and should not be used as the sole basis for treatment or other patient management decisions. Negative results must be combined with clinical observations, patient history, and epidemiological information. The expected result is Negative.  Fact Sheet for Patients: SugarRoll.be  Fact Sheet for Healthcare Providers: https://www.woods-mathews.com/  This test is not yet approved or cleared by the [place] FDA and  has been authorized for detection and/or diagnosis of SARS-CoV-2 by FDA under an Emergency Use Authorization (EUA). This EUA will remain  in effect (meaning this test can be used) for the duration of the COVID-19 declaration under Se ction 564(b)(1) of the Act, 21 U.S.C. section 360bbb-3(b)(1), unless the authorization is terminated or revoked sooner.  Performed at [facility], [place] [address] [place],  [address]     FURTHER DISCHARGE INSTRUCTIONS:  Get Medicines reviewed and adjusted: Please take all your medications with you for your next visit with your Primary MD  Laboratory/radiological data: Please request your Primary MD to go over all hospital tests and procedure/radiological results at the follow up, please ask your Primary MD to get all Hospital records sent to his/her office.  In some cases, they will be blood work, cultures and biopsy results pending at the time of your discharge. Please request that your primary care M.D. goes through all the records of  your hospital data and follows up on these results.  Also Note the following: If you experience worsening of your admission symptoms, develop shortness of breath, life threatening emergency, suicidal or homicidal thoughts you must seek medical attention immediately by calling 911 or calling your MD immediately  if symptoms less severe.  You must read complete instructions/literature along with all the possible adverse reactions/side effects for all the Medicines you take and that have been prescribed to you. Take any new Medicines after you have completely understood and accpet all the possible adverse reactions/side effects.   Do not drive when taking Pain medications or sleeping medications (Benzodaizepines)  Do not take more than prescribed Pain, Sleep and Anxiety Medications. It is not advisable to combine anxiety,sleep and pain medications without talking with your primary care practitioner  Special Instructions: If you have smoked or chewed Tobacco  in the last 2 yrs please stop smoking, stop any regular Alcohol  and or any Recreational drug use.  Wear Seat belts while driving.  Please note: You were cared for by a hospitalist during your hospital stay. Once you are discharged, your primary care physician will handle any further medical issues. Please note that NO REFILLS for any discharge medications will be authorized once you are discharged, as it is imperative that you return to your primary care physician (or establish a relationship with a primary care physician if you do not have one) for your post hospital discharge needs so that they can reassess your need for medications and monitor your lab values.  Total Time spent coordinating discharge including counseling, education and face to face time equals 35 minutes.  SignedOren Binet 03/14/2020 9:37 AM

## 2020-03-23 ENCOUNTER — Encounter: Payer: Self-pay | Admitting: Infectious Diseases

## 2020-03-23 ENCOUNTER — Other Ambulatory Visit: Payer: Self-pay

## 2020-03-23 ENCOUNTER — Ambulatory Visit (INDEPENDENT_AMBULATORY_CARE_PROVIDER_SITE_OTHER): Payer: BLUE CROSS/BLUE SHIELD | Admitting: Infectious Diseases

## 2020-03-23 VITALS — BP 93/55 | HR 87 | Temp 98.3°F

## 2020-03-23 DIAGNOSIS — I96 Gangrene, not elsewhere classified: Secondary | ICD-10-CM | POA: Diagnosis not present

## 2020-03-23 DIAGNOSIS — M86671 Other chronic osteomyelitis, right ankle and foot: Secondary | ICD-10-CM

## 2020-03-23 NOTE — Progress Notes (Signed)
Patient: Curtis Solis  DOB: Nov 12, 1955 MRN: [id number] PCP: [name], MD    Subjective:  Ardian Nawaz is a 65 y.o. male.   Here for hospital follow up after right sided transmetatarsal amputation 3/2 with Dr. Sharol Given.   Admitted 2/23 with ischemic gangrene to R toes x 5, and discharged to [facility] with Prevena wound vac in place - this was not in place today but he says that it has been dry and no drainage on dressing material when they change it. Dr. Tommy Medal saw the patient and d/c'd him on 2 additional weeks of zyvox to mop up after amputation. Intraoperative findings revealed no concern for abscess or residual infection. Still has some throbbing pain in the foot around where his great toe was. Tries to stay on top of his pain medications to ward that off.   No fevers or chills. No s/e to current antibiotic he can tell.    Review of Systems  Constitutional: Negative for chills and fever.  HENT: Negative for tinnitus.   Eyes: Negative for blurred vision and photophobia.  Respiratory: Negative for cough and sputum production.   Cardiovascular: Negative for chest pain.  Gastrointestinal: Negative for diarrhea, nausea and vomiting.  Genitourinary: Negative for dysuria.  Musculoskeletal: Positive for joint pain (throbbing foot pain R).  Skin: Negative for rash.  Neurological: Negative for headaches.    Past Medical History:  Diagnosis Date  . A-fib [facility])    During hospital stay  . CKD (chronic kidney disease)   . Hyperlipidemia   . Hyperthyroidism   . PVD (peripheral vascular disease) ([place])     Outpatient Medications Prior to Visit  Medication Sig Dispense Refill  . acetaminophen (TYLENOL) 500 MG tablet Take 1,000 mg by mouth every 6 (six) hours as needed for mild pain (or headaches).    Marland Kitchen atorvastatin (LIPITOR) 40 MG tablet Take 40 mg by mouth daily at 6 PM.    . calcium carbonate (TUMS - DOSED IN MG ELEMENTAL CALCIUM) 500 MG chewable tablet Chew 1  tablet by mouth every 8 (eight) hours as needed for indigestion or heartburn.    . carboxymethylcellulose (REFRESH PLUS) 0.5 % SOLN Place 2 drops into both eyes every 12 (twelve) hours as needed (for dryness).    . diphenoxylate-atropine (LOMOTIL) 2.5-0.025 MG tablet Take 1 tablet by mouth 4 (four) times daily. 30 tablet 0  . fiber (NUTRISOURCE FIBER) PACK packet Place 1 packet into feeding tube 2 (two) times daily.    . fludrocortisone (FLORINEF) 0.1 MG tablet Take 0.1 mg by mouth daily.    Marland Kitchen gabapentin (NEURONTIN) 100 MG capsule Take 200 mg by mouth at bedtime.    . insulin aspart (NOVOLOG) 100 UNIT/ML injection 0-9 Units, Subcutaneous, Every 4 hours CBG 70 - 120: 0 units CBG 121 - 150: 1 unit CBG 151 - 200: 2 units CBG 201 - 250: 3 units CBG 251 - 300: 5 units CBG 301 - 350: 7 units CBG 351 - 400: 9 units CBG > 400: call MD 10 mL 11  . ipratropium-albuterol (DUONEB) 0.5-2.5 (3) MG/3ML SOLN Take 3 mLs by nebulization every 6 (six) hours as needed (wheezing/cough/shortness of breath).    Marland Kitchen linezolid (ZYVOX) 600 MG tablet Take 1 tablet (600 mg total) by mouth 2 (two) times daily for 13 days. Continue through 3/13 to complete 2 weeks 26 tablet 0  . liver oil-zinc oxide (DESITIN) 40 % ointment Apply topically 2 (two) times daily. Apply to buttocks  twice daily or PRN soiling 56.7 g 0  . melatonin 5 MG TABS Take 1 tablet (5 mg total) by mouth at bedtime.  0  . multivitamin (RENA-VIT) TABS tablet Take 1 tablet by mouth at bedtime.  0  . Nutritional Supplements (FEEDING SUPPLEMENT, PROSOURCE TF,) liquid Place 45 mLs into feeding tube 2 (two) times daily.    . Nutritional Supplements (FEEDING SUPPLEMENT, VITAL AF 1.2 CAL,) LIQD Place 237 mLs into feeding tube 3 (three) times daily.    . ondansetron (ZOFRAN) 4 MG tablet Take 4 mg by mouth every 4 (four) hours as needed for nausea or vomiting.    Marland Kitchen oxyCODONE 10 MG TABS Take 1-1.5 tablets (10-15 mg total) by mouth every 4 (four) hours as needed for  moderate pain. 20 tablet 0  . pantoprazole (PROTONIX) 20 MG tablet Take 20 mg by mouth daily before breakfast.    . simethicone (MYLICON) 80 MG chewable tablet Chew 80 mg by mouth every 8 (eight) hours as needed for flatulence.    . sodium bicarbonate 650 MG tablet Take 2 tablets (1,300 mg total) by mouth 2 (two) times daily.     No facility-administered medications prior to visit.     No Known Allergies  Social History   Tobacco Use  . Smoking status: Former Smoker    Quit date: 09/26/2019    Years since quitting: 0.4  . Smokeless tobacco: Never Used  Substance Use Topics  . Alcohol use: Yes    Family History  Problem Relation Age of Onset  . Heart disease Other     Objective:   Vitals:   03/23/20 1030  BP: (!) 93/55  Pulse: 87  Temp: 98.3 F (36.8 C)  TempSrc: Oral   There is no height or weight on file to calculate BMI.  Physical Exam Constitutional:      Appearance: He is well-developed.     Comments: Seated comfortably in chair during visit.   HENT:     Mouth/Throat:     Dentition: Normal dentition. No dental abscesses.  Cardiovascular:     Rate and Rhythm: Normal rate and regular rhythm.  Pulmonary:     Effort: Pulmonary effort is normal.     Breath sounds: Normal breath sounds.  Abdominal:     General: There is no distension.     Palpations: Abdomen is soft.     Tenderness: There is no abdominal tenderness.  Musculoskeletal:     Comments: R Foot S/P transmetatarsal amputation. Surgical site is clean and dry and appears well approximated. Sutures intact. Warm and perfused with palpable dorsalis pulse.   Lymphadenopathy:     Cervical: No cervical adenopathy.  Skin:    General: Skin is warm and dry.     Capillary Refill: Capillary refill takes less than 2 seconds.     Findings: No rash.  Neurological:     Mental Status: He is alert and oriented to person, place, and time.  Psychiatric:        Judgment: Judgment normal.     Comments: In good spirits  today and engaged in care discussion.          Lab Results: Lab Results  Component Value Date   WBC 18.2 (H) 03/12/2020   HGB 8.4 (L) 03/12/2020   HCT 26.5 (L) 03/12/2020   MCV 92.0 03/12/2020   PLT 290 03/12/2020    Lab Results  Component Value Date   CREATININE 2.07 (H) 03/14/2020   BUN 42 (H) 03/14/2020  NA 135 03/14/2020   K 4.9 03/14/2020   CL 109 03/14/2020   CO2 20 (L) 03/14/2020    Lab Results  Component Value Date   ALT 30 03/11/2020   AST 25 03/11/2020   ALKPHOS 40 03/11/2020   BILITOT 1.0 03/11/2020     Assessment & Plan:   Problem List Items Addressed This Visit      Unprioritized   Osteomyelitis ([place]) - Primary   Gangrene of right foot ([place])    Ischemic wounds 2/2 PAD with dry gangrenous changes. Curative following amputation. Site is healing well today without any residual signs of infection. He can finish out his last day of antibiotics and then stop. FU with Dr. Sharol Given team as scheduled.  PRN follow up with ID.           Janene Madeira, MSN, NP-C [facility] for Infectious [place] Pager: [phone] Office: [phone]  03/23/20  11:06 AM

## 2020-03-23 NOTE — Assessment & Plan Note (Signed)
Ischemic wounds 2/2 PAD with dry gangrenous changes. Curative following amputation. Site is healing well today without any residual signs of infection. He can finish out his last day of antibiotics and then stop. FU with Dr. Sharol Given team as scheduled.  PRN follow up with ID.

## 2020-03-23 NOTE — Patient Instructions (Signed)
Your foot appears to be healing well.   Please follow up as scheduled with your orthopedic surgery team on 3/16.   Will have you finish out the last day or so of your antibiotics and then stop.   Please return as needed if any changes / concerns with your foot.

## 2020-03-25 ENCOUNTER — Ambulatory Visit (INDEPENDENT_AMBULATORY_CARE_PROVIDER_SITE_OTHER): Payer: BLUE CROSS/BLUE SHIELD | Admitting: Family

## 2020-03-25 ENCOUNTER — Other Ambulatory Visit: Payer: Self-pay

## 2020-03-25 ENCOUNTER — Encounter: Payer: Self-pay | Admitting: Family

## 2020-03-25 DIAGNOSIS — Z89431 Acquired absence of right foot: Secondary | ICD-10-CM

## 2020-03-25 MED ORDER — GABAPENTIN 100 MG PO CAPS: 200.0000 mg | ORAL_CAPSULE | Freq: Three times a day (TID) | ORAL | 2 refills | Status: AC

## 2020-03-25 NOTE — Progress Notes (Signed)
Post-Op Visit Note   Patient: Curtis Solis           Date of Birth: [birth year]           MRN: [id number] Visit Date: 03/25/2020 PCP: [name], MD  Chief Complaint: No chief complaint on file.   HPI:  HPI  The patient is a 65 year old gentleman seen status post right TMA on 03/11/20.  Was discharged on Zyvox for 2 weeks post op. States he completed this yesterday. Is followed by ID.  Wound vac removed prior to today. Is residing at [place]  Is complaining of extensive phantom pain shooting pains in his foot he is currently taking 100 mg of gabapentin at bedtime  Ortho Exam  On examination of the right foot his incision is well approximated with sutures healing well there is no active drainage no erythema no edema  Visit Diagnoses:  1. Partial nontraumatic amputation of right foot ([place])     Plan: Continue daily Dial soap cleansing.  Protected weightbearing.  Follow-up in 1 week for suture removal.  We will increase his gabapentin to 3 times daily to see if this will help with his phantom pain  Follow-Up Instructions: Return in about 1 week (around 04/01/2020).   Imaging: No results found.  Orders:  No orders of the defined types were placed in this encounter.  Meds ordered this encounter  Medications  . gabapentin (NEURONTIN) 100 MG capsule    Sig: Take 2 capsules (200 mg total) by mouth 3 (three) times daily.    Dispense:  90 capsule    Refill:  2     PMFS History: Patient Active Problem List   Diagnosis Date Noted  . Osteomyelitis ([place])   . AKI (acute kidney injury) ([place])   . PVD (peripheral vascular disease) ([place])   . Controlled type 2 diabetes mellitus with stage 4 chronic kidney disease, without long-term current use of insulin ([place]) 03/05/2020  . Hypotension 03/05/2020  . Urinary retention 03/04/2020  . 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 GERD without esophagitis 03/04/2020  .  Hyponatremia 03/04/2020  . Severe protein-calorie malnutrition ([place]) 02/04/2020  . Acute renal failure with acute tubular necrosis superimposed on stage 4 chronic kidney disease ([place]) 01/31/2020  . Hyperkalemia 01/31/2020  . Uremia 01/31/2020  . Gangrene of right foot ([place]) 01/31/2020  . Ileostomy present ([place]) 01/31/2020  . Open abdominal wall wound, subsequent encounter 01/31/2020  . Open wound of penis 01/31/2020  . Pressure injury of skin 01/31/2020  . S/P AKA (above knee amputation) unilateral, left ([place]) 10/23/2019  . 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 AAA (abdominal aortic aneurysm) ([place]) 10/06/2019  . CKD (chronic kidney disease) stage 4, GFR 15-29 ml/min ([place]) 10/06/2019   Past Medical History:  Diagnosis Date  . A-fib [facility])    During hospital stay  . CKD (chronic kidney disease)   . Hyperlipidemia   . Hyperthyroidism   . PVD (peripheral vascular disease) ([facility])     Family History  Problem Relation Age of Onset  . Heart disease Other     Past Surgical History:  Procedure Laterality Date  . ABDOMINAL AORTIC ANEURYSM REPAIR    . ABOVE KNEE LEG AMPUTATION Left   . AMPUTATION Right 03/11/2020   Procedure: RIGHT TRANSMETATARSAL AMPUTATION;  Surgeon: Newt Minion, MD;  Location: [place];  Service: Orthopedics;  Laterality: Right;  . BOWEL RESECTION  for dead bowel, s/p open abdomen  . COLOSTOMY    . GASTROSTOMY    . left leg amputation    . PERICARDIAL FLUID DRAINAGE     Placed for tamponade and removed during the [facility] hospitalization   Social History   Occupational History  . Not on file  Tobacco Use  . Smoking status: Former Smoker    Quit date: 09/26/2019    Years since quitting: 0.4  . Smokeless tobacco: Never Used  Substance and Sexual Activity  . Alcohol use: Yes  . Drug use: Not on file  . Sexual activity: Not on file

## 2020-04-01 ENCOUNTER — Ambulatory Visit: Payer: BLUE CROSS/BLUE SHIELD | Admitting: Family

## 2020-04-02 ENCOUNTER — Encounter: Payer: Self-pay | Admitting: *Deleted

## 2020-04-02 ENCOUNTER — Ambulatory Visit: Payer: BLUE CROSS/BLUE SHIELD | Admitting: Internal Medicine

## 2020-04-02 ENCOUNTER — Encounter: Payer: Self-pay | Admitting: Internal Medicine

## 2020-04-02 ENCOUNTER — Ambulatory Visit (INDEPENDENT_AMBULATORY_CARE_PROVIDER_SITE_OTHER): Payer: BLUE CROSS/BLUE SHIELD | Admitting: Internal Medicine

## 2020-04-02 ENCOUNTER — Other Ambulatory Visit: Payer: Self-pay

## 2020-04-02 ENCOUNTER — Ambulatory Visit (INDEPENDENT_AMBULATORY_CARE_PROVIDER_SITE_OTHER): Payer: BLUE CROSS/BLUE SHIELD

## 2020-04-02 VITALS — BP 90/50 | HR 80 | Ht 71.0 in

## 2020-04-02 DIAGNOSIS — Z89612 Acquired absence of left leg above knee: Secondary | ICD-10-CM

## 2020-04-02 DIAGNOSIS — I4892 Unspecified atrial flutter: Secondary | ICD-10-CM | POA: Diagnosis not present

## 2020-04-02 DIAGNOSIS — I739 Peripheral vascular disease, unspecified: Secondary | ICD-10-CM | POA: Diagnosis not present

## 2020-04-02 DIAGNOSIS — I313 Pericardial effusion (noninflammatory): Secondary | ICD-10-CM | POA: Diagnosis not present

## 2020-04-02 DIAGNOSIS — Z932 Ileostomy status: Secondary | ICD-10-CM

## 2020-04-02 DIAGNOSIS — I3139 Other pericardial effusion (noninflammatory): Secondary | ICD-10-CM | POA: Insufficient documentation

## 2020-04-02 DIAGNOSIS — N184 Chronic kidney disease, stage 4 (severe): Secondary | ICD-10-CM

## 2020-04-02 DIAGNOSIS — I714 Abdominal aortic aneurysm, without rupture, unspecified: Secondary | ICD-10-CM

## 2020-04-02 NOTE — Patient Instructions (Signed)
Medication Instructions:  Your physician recommends that you continue on your current medications as directed. Please refer to the Current Medication list given to you today.  *If you need a refill on your cardiac medications before your next appointment, please call your pharmacy*   Lab Work: TODAY: CBC If you have labs (blood work) drawn today and your tests are completely normal, you will receive your results only by: Marland Kitchen MyChart Message (if you have MyChart) OR . A paper copy in the mail If you have any lab test that is abnormal or we need to change your treatment, we will call you to review the results.   Testing/Procedures: Your physician has requested that you have an echocardiogram. Echocardiography is a painless test that uses sound waves to create images of your heart. It provides your doctor with information about the size and shape of your heart and how well your heart's chambers and valves are working. This procedure takes approximately one hour. There are no restrictions for this procedure.   Your physician has requested that you wear a 14 day heart monitor.    Follow-Up: At [facility], you and your health needs are our priority.  As part of our continuing mission to provide you with exceptional heart care, we have created designated Provider Care Teams.  These Care Teams include your primary Cardiologist (physician) and Advanced Practice Providers (APPs -  Physician Assistants and Nurse Practitioners) who all work together to provide you with the care you need, when you need it.  We recommend signing up for the patient portal called "MyChart".  Sign up information is provided on this After Visit Summary.  MyChart is used to connect with patients for Virtual Visits (Telemedicine).  Patients are able to view lab/test results, encounter notes, upcoming appointments, etc.  Non-urgent messages can be sent to your provider as well.   To learn more about what you can do with  MyChart, go to [email or web address].    Your next appointment:   6-7 month(s)  The format for your next appointment:   In Person  Provider:   You may see Rudean Haskell, MD or one of the following Advanced Practice Providers on your designated Care Team:    Melina Copa, PA-C  Ermalinda Barrios, PA-C    Other Instructions Bryn Gulling- Long Term Monitor Instructions   Your physician has requested you wear your ZIO patch monitor___14____days.   This is a single patch monitor.  [employer] supplies one patch monitor per enrollment.  Additional stickers are not available.   Please do not apply patch if you will be having a Nuclear Stress Test, Echocardiogram, Cardiac CT, MRI, or Chest Xray during the time frame you would be wearing the monitor. The patch cannot be worn during these tests.  You cannot remove and re-apply the ZIO XT patch monitor.   Your ZIO patch monitor will be sent USPS Priority mail from [facility] directly to your home address. The monitor may also be mailed to a PO BOX if home delivery is not available.   It may take 3-5 days to receive your monitor after you have been enrolled.   Once you have received you monitor, please review enclosed instructions.  Your monitor has already been registered assigning a specific monitor serial # to you.   Applying the monitor   Shave hair from upper left chest.   Hold abrader disc by orange tab.  Rub abrader in 40 strokes over left upper chest as indicated in your  monitor instructions.   Clean area with 4 enclosed alcohol pads .  Use all pads to assure are is cleaned thoroughly.  Let dry.   Apply patch as indicated in monitor instructions.  Patch will be place under collarbone on left side of chest with arrow pointing upward.   Rub patch adhesive wings for 2 minutes.Remove white label marked "1".  Remove white label marked "2".  Rub patch adhesive wings for 2 additional minutes.   While looking in a mirror, press and  release button in center of patch.  A small green light will flash 3-4 times .  This will be your only indicator the monitor has been turned on.     Do not shower for the first 24 hours.  You may shower after the first 24 hours.   Press button if you feel a symptom. You will hear a small click.  Record Date, Time and Symptom in the Patient Log Book.   When you are ready to remove patch, follow instructions on last 2 pages of Patient Log Book.  Stick patch monitor onto last page of Patient Log Book.   Place Patient Log Book in [place] box.  Use locking tab on box and tape box closed securely.  The [employer] and [employer] has [employer] on it.  Please place in mailbox as soon as possible.  Your physician should have your test results approximately 7 days after the monitor has been mailed back to [facility].   Call [place] at [phone] if you have questions regarding your ZIO XT patch monitor.  Call them immediately if you see an orange light blinking on your monitor.   If your monitor falls off in less than 4 days contact our Monitor department at [phone].  If your monitor becomes loose or falls off after 4 days call [employer] at [phone] for suggestions on securing your monitor.

## 2020-04-02 NOTE — Progress Notes (Signed)
Cardiology Office Note:    Date:  04/02/2020   ID:  Curtis Solis, DOB [birth year], MRN [id number]  PCP:  [name], [place]  Cardiologist:  Werner Lean, MD  Advanced Practice Provider:  No care team member to display Electrophysiologist:  None       Referring MD: [name], MD   CC: Get things sorted Consulted for the evaluation of atrial fibrillation at the behest of [name], MD  History of Present Illness:    [name] is a 65 y.o. male with a hx of HTN, HLD, PAD with AAA complicated by Atrial flutter, Cardiac tamponade, with placement of pericardial drain; DM with CKD Stage IV-? needing CCRT, Severe protein calorie malnutrition with PEG tube; s/p AKA.  Patient was seen 03/14/2020 after evaluation pose AAA surgery at [facility] and had ischemic bowel disease and a pericardial effusion.  Also complicated by adrenal insuffiencey.  Patient is currently at [facility].  After all of this is smoke free.  Patient notes that he is feeling fine, all things considered.  Doesn't remember having atrial flutter or dying.  Has had no chest pain, chest pressure, chest tightness, chest stinging.  Patient exertion notable for doing rehab and feels pain with stable.  No shortness of breath at rest, but fees DOE after doing a lot.  Feels tired.  No syncope or near syncope. Notes no palpitations or funny heart beats.   Bleeding issues.  One ECG from [facility] 11/06/19 May have had atrial flutter with variable block: Unable to see EKG.   Ambulatory BP not done.  Past Medical History:  Diagnosis Date  . A-fib [facility])    During hospital stay  . CKD (chronic kidney disease)   . Hyperlipidemia   . Hyperthyroidism   . PVD (peripheral vascular disease) ([place])     Past Surgical History:  Procedure Laterality Date  . ABDOMINAL AORTIC ANEURYSM REPAIR    . ABOVE KNEE LEG AMPUTATION Left   . AMPUTATION Right 03/11/2020   Procedure: RIGHT  TRANSMETATARSAL AMPUTATION;  Surgeon: Newt Minion, MD;  Location: [place];  Service: Orthopedics;  Laterality: Right;  . BOWEL RESECTION     for dead bowel, s/p open abdomen  . COLOSTOMY    . GASTROSTOMY    . left leg amputation    . PERICARDIAL FLUID DRAINAGE     Placed for tamponade and removed during the [facility] hospitalization    Current Medications: Current Meds  Medication Sig  . acetaminophen (TYLENOL) 500 MG tablet Take 1,000 mg by mouth every 6 (six) hours as needed for mild pain (or headaches).  Marland Kitchen atorvastatin (LIPITOR) 40 MG tablet Take 40 mg by mouth daily at 6 PM.  . calcium carbonate (TUMS - DOSED IN MG ELEMENTAL CALCIUM) 500 MG chewable tablet Chew 1 tablet by mouth every 8 (eight) hours as needed for indigestion or heartburn.  . carboxymethylcellulose (REFRESH PLUS) 0.5 % SOLN Place 2 drops into both eyes every 12 (twelve) hours as needed (for dryness).  . diphenhydrAMINE (BENADRYL) 25 MG tablet Take 25 mg by mouth every 6 (six) hours as needed.  . diphenoxylate-atropine (LOMOTIL) 2.5-0.025 MG tablet Take 1 tablet by mouth 4 (four) times daily.  . fiber (NUTRISOURCE FIBER) PACK packet Place 1 packet into feeding tube 2 (two) times daily.  . fludrocortisone (FLORINEF) 0.1 MG tablet Take 0.1 mg by mouth daily.  Marland Kitchen gabapentin (NEURONTIN) 100 MG capsule Take 2 capsules (200 mg total)  by mouth 3 (three) times daily.  . insulin aspart (NOVOLOG) 100 UNIT/ML injection 0-9 Units, Subcutaneous, Every 4 hours CBG 70 - 120: 0 units CBG 121 - 150: 1 unit CBG 151 - 200: 2 units CBG 201 - 250: 3 units CBG 251 - 300: 5 units CBG 301 - 350: 7 units CBG 351 - 400: 9 units CBG > 400: call MD  . insulin lispro (HUMALOG) 100 UNIT/ML injection Inject 100 Units into the skin 3 (three) times daily before meals. SLIDING SCALE INJECTION.  Marland Kitchen ipratropium-albuterol (DUONEB) 0.5-2.5 (3) MG/3ML SOLN Take 3 mLs by nebulization every 6 (six) hours as needed (wheezing/cough/shortness of breath).  Marland Kitchen  linezolid (ZYVOX) 600 MG tablet Take 600 mg by mouth 2 (two) times daily.  Marland Kitchen liver oil-zinc oxide (DESITIN) 40 % ointment Apply topically 2 (two) times daily. Apply to buttocks twice daily or PRN soiling  . melatonin 5 MG TABS Take 1 tablet (5 mg total) by mouth at bedtime.  . multivitamin (RENA-VIT) TABS tablet Take 1 tablet by mouth at bedtime.  . Nutritional Supplements (FEEDING SUPPLEMENT, PROSOURCE TF,) liquid Place 45 mLs into feeding tube 2 (two) times daily.  . Nutritional Supplements (FEEDING SUPPLEMENT, VITAL AF 1.2 CAL,) LIQD Place 237 mLs into feeding tube 3 (three) times daily.  . ondansetron (ZOFRAN) 4 MG tablet Take 4 mg by mouth every 4 (four) hours as needed for nausea or vomiting.  Marland Kitchen oxyCODONE 10 MG TABS Take 1-1.5 tablets (10-15 mg total) by mouth every 4 (four) hours as needed for moderate pain.  . pantoprazole (PROTONIX) 20 MG tablet Take 20 mg by mouth daily before breakfast.  . simethicone (MYLICON) 80 MG chewable tablet Chew 80 mg by mouth every 8 (eight) hours as needed for flatulence.  . sodium bicarbonate 650 MG tablet Take 2 tablets (1,300 mg total) by mouth 2 (two) times daily.     Allergies:   Patient has no known allergies.   Social History   Socioeconomic History  . Marital status: Married    Spouse name: Not on file  . Number of children: Not on file  . Years of education: Not on file  . Highest education level: Not on file  Occupational History  . Not on file  Tobacco Use  . Smoking status: Former Smoker    Quit date: 09/26/2019    Years since quitting: 0.5  . Smokeless tobacco: Never Used  Substance and Sexual Activity  . Alcohol use: Yes  . Drug use: Not on file  . Sexual activity: Not on file  Other Topics Concern  . Not on file  Social History Narrative  . Not on file   Social Determinants of Health   Financial Resource Strain: Not on file  Food Insecurity: Not on file  Transportation Needs: Not on file  Physical Activity: Not on file   Stress: Not on file  Social Connections: Not on file     Family History: The patient's family history includes Heart disease in an other family member. History of coronary artery disease notable for brother needing PCI. History of heart failure notable for no members. History of arrhythmia notable for no members.  ROS:   Please see the history of present illness.     All other systems reviewed and are negative.  EKGs/Labs/Other Studies Reviewed:    The following studies were reviewed today:  EKG:  EKG is  ordered today.  The ekg ordered today demonstrates  04/02/20: SR 92 WNL  Outside Records  Echo - small pericardial effusion with normal EF 09/27/2019   Recent Labs: 03/11/2020: ALT 30; B Natriuretic Peptide 244.3 03/12/2020: Hemoglobin 8.4; Magnesium 2.5; Platelets 290 03/14/2020: BUN 42; Creatinine, Ser 2.07; Potassium 4.9; Sodium 135  Recent Lipid Panel No results found for: CHOL, TRIG, HDL, CHOLHDL, VLDL, LDLCALC, LDLDIRECT   Risk Assessment/Calculations:     N/A  Physical Exam:    VS:  BP (!) 90/50   Pulse 80   Ht '5\' 11"'$  (1.803 m)   SpO2 98%   BMI 20.23 kg/m     Wt Readings from Last 3 Encounters:  03/14/20 145 lb 1 oz (65.8 kg)  02/06/20 109 lb (49.4 kg)    GEN: Well developed in no acute distress HEENT: Normal NECK: No JVD; No carotid bruits LYMPHATICS: No lymphadenopathy CARDIAC: RRR, no murmurs, rubs, gallops RESPIRATORY:  Clear to auscultation without rales, wheezing or rhonchi  ABDOMEN: PEG tube and ostomy C/D/I without bleeding MUSCULOSKELETAL:  No edema SKIN: Warm and dry no hematoma at AKA site (left) did not assess pressure ulcer NEUROLOGIC:  Alert and oriented x 3 PSYCHIATRIC:  Normal affect   ASSESSMENT:    1. Atrial flutter, unspecified type ([place])   2. Pericardial effusion   3. Abdominal aortic aneurysm (AAA) without rupture ([place])   4. PVD (peripheral vascular disease) ([place])   5. CKD (chronic kidney disease) stage 4, GFR 15-29 ml/min  (H[facility])   6. S/P AKA (above knee amputation) unilateral, left ([place])   7. Ileostomy present ([place])    PLAN:    In order of problems listed above:  Question of Atrial Flutter With HTN, DM PAD (vascular disease with recent amputations) CKD Stage IV Malnutrition making Xarelto a poor choice Pericardial effusion Patient has a high risk of clinical decompensation:  Presently Full Code - AFl may have been related to has pericardial effusion and tamponade - will get 14 day ZioPatchto assess burden  - will get CBC today - will get echocardiogram so assess post tamponade/CRRT baseline - continue current statin - likely would not tolerate BB meds on Florinef - at next evaluation and post rehab; will discussed more aggressive cholesterol mgmt Depending on GOC   Medication Adjustments/Labs and Tests Ordered: Current medicines are reviewed at length with the patient today.  Concerns regarding medicines are outlined above.  Orders Placed This Encounter  Procedures  . CBC  . LONG TERM MONITOR (3-14 DAYS)  . ECHOCARDIOGRAM COMPLETE   No orders of the defined types were placed in this encounter.   Patient Instructions  Medication Instructions:  Your physician recommends that you continue on your current medications as directed. Please refer to the Current Medication list given to you today.  *If you need a refill on your cardiac medications before your next appointment, please call your pharmacy*   Lab Work: TODAY: CBC If you have labs (blood work) drawn today and your tests are completely normal, you will receive your results only by: Marland Kitchen MyChart Message (if you have MyChart) OR . A paper copy in the mail If you have any lab test that is abnormal or we need to change your treatment, we will call you to review the results.   Testing/Procedures: Your physician has requested that you have an echocardiogram. Echocardiography is a painless test that uses sound waves to create images of your  heart. It provides your doctor with information about the size and shape of your heart and how well your heart's chambers and valves are working. This procedure takes  approximately one hour. There are no restrictions for this procedure.   Your physician has requested that you wear a 14 day heart monitor.    Follow-Up: At [facility], you and your health needs are our priority.  As part of our continuing mission to provide you with exceptional heart care, we have created designated Provider Care Teams.  These Care Teams include your primary Cardiologist (physician) and Advanced Practice Providers (APPs -  Physician Assistants and Nurse Practitioners) who all work together to provide you with the care you need, when you need it.  We recommend signing up for the patient portal called "MyChart".  Sign up information is provided on this After Visit Summary.  MyChart is used to connect with patients for Virtual Visits (Telemedicine).  Patients are able to view lab/test results, encounter notes, upcoming appointments, etc.  Non-urgent messages can be sent to your provider as well.   To learn more about what you can do with MyChart, go to [email or web address].    Your next appointment:   6-7 month(s)  The format for your next appointment:   In Person  Provider:   You may see Rudean Haskell, MD or one of the following Advanced Practice Providers on your designated Care Team:    Melina Copa, PA-C  Ermalinda Barrios, PA-C    Other Instructions Bryn Gulling- Long Term Monitor Instructions   Your physician has requested you wear your ZIO patch monitor___14____days.   This is a single patch monitor.  [employer] supplies one patch monitor per enrollment.  Additional stickers are not available.   Please do not apply patch if you will be having a Nuclear Stress Test, Echocardiogram, Cardiac CT, MRI, or Chest Xray during the time frame you would be wearing the monitor. The patch cannot be worn during  these tests.  You cannot remove and re-apply the ZIO XT patch monitor.   Your ZIO patch monitor will be sent USPS Priority mail from [facility] directly to your home address. The monitor may also be mailed to a PO BOX if home delivery is not available.   It may take 3-5 days to receive your monitor after you have been enrolled.   Once you have received you monitor, please review enclosed instructions.  Your monitor has already been registered assigning a specific monitor serial # to you.   Applying the monitor   Shave hair from upper left chest.   Hold abrader disc by orange tab.  Rub abrader in 40 strokes over left upper chest as indicated in your monitor instructions.   Clean area with 4 enclosed alcohol pads .  Use all pads to assure are is cleaned thoroughly.  Let dry.   Apply patch as indicated in monitor instructions.  Patch will be place under collarbone on left side of chest with arrow pointing upward.   Rub patch adhesive wings for 2 minutes.Remove white label marked "1".  Remove white label marked "2".  Rub patch adhesive wings for 2 additional minutes.   While looking in a mirror, press and release button in center of patch.  A small green light will flash 3-4 times .  This will be your only indicator the monitor has been turned on.     Do not shower for the first 24 hours.  You may shower after the first 24 hours.   Press button if you feel a symptom. You will hear a small click.  Record Date, Time and Symptom in the Patient Log Book.  When you are ready to remove patch, follow instructions on last 2 pages of Patient Log Book.  Stick patch monitor onto last page of Patient Log Book.   Place Patient Log Book in [place] box.  Use locking tab on box and tape box closed securely.  The [employer] has [employer] on it.  Please place in mailbox as soon as possible.  Your physician should have your test results approximately 7 days after the monitor has been  mailed back to [facility].   Call [place] at [phone] if you have questions regarding your ZIO XT patch monitor.  Call them immediately if you see an orange light blinking on your monitor.   If your monitor falls off in less than 4 days contact our Monitor department at [phone].  If your monitor becomes loose or falls off after 4 days call [employer] at [phone] for suggestions on securing your monitor.       Signed, Werner Lean, MD  04/02/2020 2:44 PM    [place]

## 2020-04-02 NOTE — Progress Notes (Signed)
Patient ID: Curtis Solis, male   DOB: 12-29-1955, 65 y.o.   MRN: [id number] [employer] notified to ship patients ZIO XT monitor to [facility]  [place], [place], [place]  [id number]

## 2020-04-02 NOTE — Progress Notes (Signed)
Patient ID: Curtis Solis, male   DOB: 1955/04/29, 65 y.o.   MRN: [id number] Patient enrolled for [employer] to ship a 14 day ZIO XT long term holter monitor to his home.

## 2020-04-03 LAB — CBC
Hematocrit: 28.8 % — ABNORMAL LOW (ref 37.5–51.0)
Hemoglobin: 9.3 g/dL — ABNORMAL LOW (ref 13.0–17.7)
MCH: 29.2 pg (ref 26.6–33.0)
MCHC: 32.3 g/dL (ref 31.5–35.7)
MCV: 90 fL (ref 79–97)
Platelets: 264 10*3/uL (ref 150–450)
RBC: 3.19 x10E6/uL — ABNORMAL LOW (ref 4.14–5.80)
RDW: 15.6 % — ABNORMAL HIGH (ref 11.6–15.4)
WBC: 7.7 10*3/uL (ref 3.4–10.8)

## 2020-04-07 ENCOUNTER — Ambulatory Visit: Payer: BLUE CROSS/BLUE SHIELD | Admitting: Family

## 2020-04-08 DIAGNOSIS — I4892 Unspecified atrial flutter: Secondary | ICD-10-CM | POA: Diagnosis not present

## 2020-04-16 ENCOUNTER — Other Ambulatory Visit: Payer: Self-pay

## 2020-04-16 ENCOUNTER — Emergency Department (HOSPITAL_COMMUNITY)
Admission: EM | Admit: 2020-04-16 | Discharge: 2020-04-16 | Disposition: A | Discharge status: Home / Self Care | Payer: BLUE CROSS/BLUE SHIELD | Attending: Emergency Medicine | Admitting: Emergency Medicine

## 2020-04-16 ENCOUNTER — Encounter (HOSPITAL_COMMUNITY): Payer: Self-pay | Admitting: Pharmacy Technician

## 2020-04-16 ENCOUNTER — Emergency Department (HOSPITAL_COMMUNITY): Payer: BLUE CROSS/BLUE SHIELD

## 2020-04-16 DIAGNOSIS — E1122 Type 2 diabetes mellitus with diabetic chronic kidney disease: Secondary | ICD-10-CM | POA: Diagnosis not present

## 2020-04-16 DIAGNOSIS — K9423 Gastrostomy malfunction: Secondary | ICD-10-CM

## 2020-04-16 DIAGNOSIS — Z87891 Personal history of nicotine dependence: Secondary | ICD-10-CM | POA: Diagnosis not present

## 2020-04-16 DIAGNOSIS — I129 Hypertensive chronic kidney disease with stage 1 through stage 4 chronic kidney disease, or unspecified chronic kidney disease: Secondary | ICD-10-CM | POA: Diagnosis not present

## 2020-04-16 DIAGNOSIS — N184 Chronic kidney disease, stage 4 (severe): Secondary | ICD-10-CM | POA: Insufficient documentation

## 2020-04-16 DIAGNOSIS — Z794 Long term (current) use of insulin: Secondary | ICD-10-CM | POA: Insufficient documentation

## 2020-04-16 DIAGNOSIS — Z79899 Other long term (current) drug therapy: Secondary | ICD-10-CM | POA: Insufficient documentation

## 2020-04-16 DIAGNOSIS — Z431 Encounter for attention to gastrostomy: Secondary | ICD-10-CM

## 2020-04-16 MED ORDER — IOHEXOL 180 MG/ML  SOLN
50.0000 mL | Freq: Once | INTRAMUSCULAR | Status: AC | PRN
  Administered 2020-04-16: 50 mL

## 2020-04-16 NOTE — ED Notes (Signed)
Report given to [facility] , PTAR notified by Network engineer for transport.

## 2020-04-16 NOTE — Discharge Instructions (Addendum)
Curtis Solis was seen in the ER for displaced PEG tube.  He had a 22 French PEG tube in place originally.  We were able to replace it with an 36 French PEG tube.  We have contacted our interventional radiology team.  They will call the nursing home to schedule an appointment for larger PEG tube placement.  Call the number provided if you do not hear from them by noon tomorrow.

## 2020-04-16 NOTE — ED Triage Notes (Signed)
Pt bib ptar from [facility] after feeding tube came out sometime after 4pm today. Pt in NAD.

## 2020-04-16 NOTE — ED Notes (Signed)
Colostomy bag emptied at this time

## 2020-04-16 NOTE — ED Provider Notes (Signed)
[facility] EMERGENCY DEPARTMENT Provider Note   CSN: [id number] Arrival date & time: 04/16/20  1735     History No chief complaint on file.   Curtis Solis is a 65 y.o. male.  HPI     65 year old male with history of A. fib, CKD hypertension and recent admission at [facility] for AAA repair comes in a chief complaint of PEG tube displacement.  According to the patient, his last feed was around 4 PM.  Thereafter, he started having some discomfort in his abdomen and when he looked at the PEG tube site, the PEG tube had come loose.  He is able to tolerate orals as well, but does require certain medications and shakes through the PEG tube.  Past Medical History:  Diagnosis Date  . A-fib [facility])    During hospital stay  . CKD (chronic kidney disease)   . Hyperlipidemia   . Hyperthyroidism   . PVD (peripheral vascular disease) [facility])     Patient Active Problem List   Diagnosis Date Noted  . Pericardial effusion 04/02/2020  . Atrial flutter ([place]) 04/02/2020  . Osteomyelitis ([place])   . AKI (acute kidney injury) ([place])   . PVD (peripheral vascular disease) ([place])   . Controlled type 2 diabetes mellitus with stage 4 chronic kidney disease, without long-term current use of insulin ([place]) 03/05/2020  . Hypotension 03/05/2020  . Urinary retention 03/04/2020  . 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 GERD without esophagitis 03/04/2020  . Hyponatremia 03/04/2020  . Severe protein-calorie malnutrition ([place]) 02/04/2020  . Acute renal failure with acute tubular necrosis superimposed on stage 4 chronic kidney disease ([place]) 01/31/2020  . Hyperkalemia 01/31/2020  . Uremia 01/31/2020  . Gangrene of right foot ([place]) 01/31/2020  . Ileostomy present ([place]) 01/31/2020  . Open abdominal wall wound, subsequent encounter 01/31/2020  . Open wound of penis 01/31/2020  . Pressure injury of skin 01/31/2020  . S/P AKA (above knee amputation) unilateral,  left ([place]) 10/23/2019  . 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 AAA (abdominal aortic aneurysm) ([place]) 10/06/2019  . CKD (chronic kidney disease) stage 4, GFR 15-29 ml/min ([place]) 10/06/2019    Past Surgical History:  Procedure Laterality Date  . ABDOMINAL AORTIC ANEURYSM REPAIR    . ABOVE KNEE LEG AMPUTATION Left   . AMPUTATION Right 03/11/2020   Procedure: RIGHT TRANSMETATARSAL AMPUTATION;  Surgeon: Newt Minion, MD;  Location: [place];  Service: Orthopedics;  Laterality: Right;  . BOWEL RESECTION     for dead bowel, s/p open abdomen  . COLOSTOMY    . GASTROSTOMY    . left leg amputation    . PERICARDIAL FLUID DRAINAGE     Placed for tamponade and removed during the [facility] hospitalization       Family History  Problem Relation Age of Onset  . Heart disease Other     Social History   Tobacco Use  . Smoking status: Former Smoker    Quit date: 09/26/2019    Years since quitting: 0.5  . Smokeless tobacco: Never Used  Substance Use Topics  . Alcohol use: Yes    Home Medications Prior to Admission medications   Medication Sig Start Date End Date Taking? Authorizing Provider  acetaminophen (TYLENOL) 500 MG tablet Take 1,000 mg by mouth every 6 (six) hours as needed for mild pain (or headaches).    [provider]  atorvastatin (LIPITOR) 40 MG tablet Take 40 mg  by mouth daily at 6 PM.    [provider]  calcium carbonate (TUMS - DOSED IN MG ELEMENTAL CALCIUM) 500 MG chewable tablet Chew 1 tablet by mouth every 8 (eight) hours as needed for indigestion or heartburn.    [provider]  carboxymethylcellulose (REFRESH PLUS) 0.5 % SOLN Place 2 drops into both eyes every 12 (twelve) hours as needed (for dryness).    [provider]  diphenhydrAMINE (BENADRYL) 25 MG tablet Take 25 mg by mouth every 6 (six) hours as needed.    [provider]  diphenoxylate-atropine (LOMOTIL) 2.5-0.025 MG tablet Take 1 tablet by  mouth 4 (four) times daily. 03/14/20   Ghimire, Henreitta Leber, MD  fiber (NUTRISOURCE FIBER) PACK packet Place 1 packet into feeding tube 2 (two) times daily. 03/14/20   Ghimire, Henreitta Leber, MD  fludrocortisone (FLORINEF) 0.1 MG tablet Take 0.1 mg by mouth daily.    [provider]  gabapentin (NEURONTIN) 100 MG capsule Take 2 capsules (200 mg total) by mouth 3 (three) times daily. 03/25/20   Suzan Slick, NP  insulin aspart (NOVOLOG) 100 UNIT/ML injection 0-9 Units, Subcutaneous, Every 4 hours CBG 70 - 120: 0 units CBG 121 - 150: 1 unit CBG 151 - 200: 2 units CBG 201 - 250: 3 units CBG 251 - 300: 5 units CBG 301 - 350: 7 units CBG 351 - 400: 9 units CBG > 400: call MD 03/14/20   Jonetta Osgood, MD  insulin lispro (HUMALOG) 100 UNIT/ML injection Inject 100 Units into the skin 3 (three) times daily before meals. SLIDING SCALE INJECTION.    [provider]  ipratropium-albuterol (DUONEB) 0.5-2.5 (3) MG/3ML SOLN Take 3 mLs by nebulization every 6 (six) hours as needed (wheezing/cough/shortness of breath).    [provider]  linezolid (ZYVOX) 600 MG tablet Take 600 mg by mouth 2 (two) times daily.    [provider]  linezolid (ZYVOX) 600 MG tablet TAKE 1 TABLET (600 MG TOTAL) BY MOUTH 2 (TWO) TIMES DAILY FOR 13 DAYS. CONTINUE THROUGH 3/13 TO COMPLETE 2 WEEKS 03/14/20 03/14/21  Ghimire, Henreitta Leber, MD  liver oil-zinc oxide (DESITIN) 40 % ointment Apply topically 2 (two) times daily. Apply to buttocks twice daily or PRN soiling 03/14/20   Ghimire, Henreitta Leber, MD  melatonin 5 MG TABS Take 1 tablet (5 mg total) by mouth at bedtime. 03/14/20   Ghimire, Henreitta Leber, MD  multivitamin (RENA-VIT) TABS tablet Take 1 tablet by mouth at bedtime. 03/14/20   Ghimire, Henreitta Leber, MD  Nutritional Supplements (FEEDING SUPPLEMENT, PROSOURCE TF,) liquid Place 45 mLs into feeding tube 2 (two) times daily. 03/14/20   Ghimire, Henreitta Leber, MD  Nutritional Supplements (FEEDING SUPPLEMENT, VITAL AF 1.2 CAL,)  LIQD Place 237 mLs into feeding tube 3 (three) times daily. 03/14/20   Ghimire, Henreitta Leber, MD  ondansetron (ZOFRAN) 4 MG tablet Take 4 mg by mouth every 4 (four) hours as needed for nausea or vomiting.    [provider]  oxyCODONE 10 MG TABS Take 1-1.5 tablets (10-15 mg total) by mouth every 4 (four) hours as needed for moderate pain. 03/14/20   Ghimire, Henreitta Leber, MD  pantoprazole (PROTONIX) 20 MG tablet Take 20 mg by mouth daily before breakfast.    [provider]  simethicone (MYLICON) 80 MG chewable tablet Chew 80 mg by mouth every 8 (eight) hours as needed for flatulence.    [provider]  sodium bicarbonate 650 MG tablet Take 2 tablets (1,300 mg  total) by mouth 2 (two) times daily. 02/05/20   Charlynne Cousins, MD    Allergies    Patient has no known allergies.  Review of Systems   Review of Systems  Constitutional: Negative for activity change.  Respiratory: Negative for shortness of breath.   Cardiovascular: Negative for chest pain.  Gastrointestinal: Negative for abdominal pain, nausea and vomiting.  Hematological: Does not bruise/bleed easily.    Physical Exam Updated Vital Signs BP 112/68 (BP Location: Right Arm)   Pulse 84   Temp 97.6 F (36.4 C) (Temporal)   Resp 18   SpO2 99%   Physical Exam Vitals and nursing note reviewed.  Constitutional:      Appearance: He is well-developed.  HENT:     Head: Atraumatic.  Cardiovascular:     Rate and Rhythm: Normal rate.  Pulmonary:     Effort: Pulmonary effort is normal.  Abdominal:     Comments: Abdominal wall has protruding stoma at the site of the original PEG tube placement. There is minimal drainage around the site. Abdomen is soft, mild tenderness around the PEG tube site.  Musculoskeletal:     Cervical back: Neck supple.  Skin:    General: Skin is warm.  Neurological:     Mental Status: He is alert and oriented to person, place, and time.     ED Results / Procedures /  Treatments   Labs (all labs ordered are listed, but only abnormal results are displayed) Labs Reviewed - No data to display  EKG None  Radiology DG ABDOMEN PEG TUBE LOCATION  Result Date: 04/16/2020 CLINICAL DATA:  Peg tube placement. EXAM: ABDOMEN - 1 VIEW COMPARISON:  None. FINDINGS: A percutaneous gastrostomy tube is seen with its distal tip and insufflator bulb overlying the gastric antrum. Radiopaque contrast is noted throughout the gastric lumen and proximal duodenum following injection of radiopaque contrast material. The bowel gas pattern is normal. A right lower quadrant ostomy site is seen. Radiopaque surgical sutures are also noted within this region. No radio-opaque calculi or other significant radiographic abnormality are seen. IMPRESSION: Percutaneous gastrostomy tube positioning, as described above. Electronically Signed   By: Virgina Norfolk M.D.   On: 04/16/2020 21:33    Procedures Gastrostomy tube replacement  Date/Time: 04/16/2020 10:36 PM Performed by: Varney Biles, MD Authorized by: Varney Biles, MD  Consent: Verbal consent obtained. Risks and benefits: risks, benefits and alternatives were discussed Consent given by: patient Patient understanding: patient states understanding of the procedure being performed Imaging studies: imaging studies available Required items: required blood products, implants, devices, and special equipment available Patient identity confirmed: arm band Time out: Immediately prior to procedure a "time out" was called to verify the correct patient, procedure, equipment, support staff and site/side marked as required. Preparation: Patient was prepped and draped in the usual sterile fashion. Local anesthesia used: no  Anesthesia: Local anesthesia used: no  Sedation: Patient sedated: no  Patient tolerance: patient tolerated the procedure well with no immediate complications      Medications Ordered in ED Medications  iohexol  (OMNIPAQUE) 180 MG/ML injection 50 mL (50 mLs Per Tube Contrast Given 04/16/20 2125)    ED Course  I have reviewed the triage vital signs and the nursing notes.  Pertinent labs & imaging results that were available during my care of the patient were reviewed by me and considered in my medical decision making (see chart for details).    MDM Rules/Calculators/A&P  65 year old male comes in a chief complaint of PEG tube displacement.  The abdominal exam is reassuring.  We were able to place an 42 French gastrotomy tube in the ER.  Originally patient had a 33 [place] G-tube.  I spoke with Dr. Sandi Mariscal, interventional radiology.  He has taken patient's information, and they will reach out to the nursing home for placement of the 22 [place] G-tube.  Patient is stable for discharge.   Final Clinical Impression(s) / ED Diagnoses Final diagnoses:  PEG tube malfunction [facility])    Rx / DC Orders ED Discharge Orders    None       Varney Biles, MD 04/16/20 2239

## 2020-04-16 NOTE — ED Notes (Signed)
Fr.18 G-tube inserted by EDP .

## 2020-04-16 NOTE — ED Notes (Signed)
EDP inserted temporary foley catheter as G- tube.

## 2020-04-16 NOTE — ED Notes (Signed)
[facility] called  

## 2020-04-17 ENCOUNTER — Telehealth (HOSPITAL_COMMUNITY): Payer: Self-pay | Admitting: Radiology

## 2020-04-17 NOTE — Telephone Encounter (Signed)
[facility] to have patient come in for G tube upsize from 56F to 69F. Could not get anyone to answer the phone on his hall. Called multiple times. JM

## 2020-04-23 ENCOUNTER — Encounter: Payer: Self-pay | Admitting: Orthopedic Surgery

## 2020-04-23 ENCOUNTER — Ambulatory Visit (INDEPENDENT_AMBULATORY_CARE_PROVIDER_SITE_OTHER): Payer: BLUE CROSS/BLUE SHIELD | Admitting: Physician Assistant

## 2020-04-23 DIAGNOSIS — M8600 Acute hematogenous osteomyelitis, unspecified site: Secondary | ICD-10-CM

## 2020-04-23 NOTE — Progress Notes (Signed)
Office Visit Note   Patient: Curtis Solis           Date of Birth: 06/02/1955           MRN: [id number] Visit Date: 04/23/2020              Requested by: Ma Rings, MD [address] [place],  [place] [address] PCP: Ma Rings, MD  Chief Complaint  Patient presents with  . Right Foot - Routine Post Op    03/11/20 right transmet met amputation       HPI: Patient is a pleasant 65 year old gentleman who is status post right transmetatarsal amputation 6 weeks ago.  He has been nonweightbearing from his shoe still has stitches in place.  Assessment & Plan: Visit Diagnoses: No diagnosis found.  Plan: Patient was provided a prescription for 2 large shrinkers I talked to him about placing this directly on the skin.  Will need to follow-up for reevaluation in a week to see if the remaining sutures can be removed  Follow-Up Instructions: No follow-ups on file.   Ortho Exam  Patient is alert, oriented, no adenopathy, well-dressed, normal affect, normal respiratory effort. Right foot he has a strong dorsalis pedis pulse.  Overall well apposed wound edges sutures are still in place.  There is just 1 small area of about 2 mm in the central portion has some bloody drainage but has not quite healed.  The surrounding sutures were removed with some difficulty as they were embedded in the skin.  Patient will follow up in 1 week.  No ascending cellulitis or signs of infection  Imaging: No results found. No images are attached to the encounter.  Labs: Lab Results  Component Value Date   HGBA1C 5.1 03/05/2020   ESRSEDRATE 48 (H) 03/10/2020   CRP 0.8 03/10/2020   REPTSTATUS 03/07/2020 FINAL 03/05/2020   GRAMSTAIN  03/05/2020    RARE WBC PRESENT, PREDOMINANTLY PMN MODERATE GRAM POSITIVE COCCI Performed at [facility], [place] [address], [place], [place] [address]    CULT  03/05/2020    ABUNDANT METHICILLIN RESISTANT STAPHYLOCOCCUS AUREUS   LABORGA METHICILLIN RESISTANT  STAPHYLOCOCCUS AUREUS 03/05/2020     Lab Results  Component Value Date   ALBUMIN 2.1 (L) 03/11/2020   ALBUMIN 2.0 (L) 03/10/2020   ALBUMIN 2.0 (L) 03/09/2020    Lab Results  Component Value Date   MG 2.5 (H) 03/12/2020   MG 1.5 (L) 03/11/2020   MG 1.7 03/10/2020   No results found for: VD25OH  No results found for: PREALBUMIN CBC EXTENDED Latest Ref Rng & Units 04/02/2020 03/12/2020 03/11/2020  WBC 3.4 - 10.8 x10E3/uL 7.7 18.2(H) 11.0(H)  RBC 4.14 - 5.80 x10E6/uL 3.19(L) 2.88(L) 2.65(L)  HGB 13.0 - 17.7 g/dL 9.3(L) 8.4(L) 7.7(L)  HCT 37.5 - 51.0 % 28.8(L) 26.5(L) 24.3(L)  PLT 150 - 450 x10E3/uL 264 290 259  NEUTROABS 1.7 - 7.7 K/uL - - -  LYMPHSABS 0.7 - 4.0 K/uL - - -     There is no height or weight on file to calculate BMI.  Orders:  No orders of the defined types were placed in this encounter.  No orders of the defined types were placed in this encounter.    Procedures: No procedures performed  Clinical Data: No additional findings.  ROS:  All other systems negative, except as noted in the HPI. Review of Systems  Objective: Vital Signs: There were no vitals taken for this visit.  Specialty Comments:  No  specialty comments available.  PMFS History: Patient Active Problem List   Diagnosis Date Noted  . Pericardial effusion 04/02/2020  . Atrial flutter ([place]) 04/02/2020  . Osteomyelitis ([place])   . AKI (acute kidney injury) ([place])   . PVD (peripheral vascular disease) ([place])   . Controlled type 2 diabetes mellitus with stage 4 chronic kidney disease, without long-term current use of insulin ([place]) 03/05/2020  . Hypotension 03/05/2020  . Urinary retention 03/04/2020  . 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 GERD without esophagitis 03/04/2020  . Hyponatremia 03/04/2020  . Severe protein-calorie malnutrition ([place]) 02/04/2020  . Acute renal failure with acute tubular necrosis superimposed on stage 4 chronic kidney disease  ([place]) 01/31/2020  . Hyperkalemia 01/31/2020  . Uremia 01/31/2020  . Gangrene of right foot ([place]) 01/31/2020  . Ileostomy present ([place]) 01/31/2020  . Open abdominal wall wound, subsequent encounter 01/31/2020  . Open wound of penis 01/31/2020  . Pressure injury of skin 01/31/2020  . S/P AKA (above knee amputation) unilateral, left ([place]) 10/23/2019  . 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 AAA (abdominal aortic aneurysm) ([place]) 10/06/2019  . CKD (chronic kidney disease) stage 4, GFR 15-29 ml/min ([place]) 10/06/2019   Past Medical History:  Diagnosis Date  . A-fib [facility])    During hospital stay  . CKD (chronic kidney disease)   . Hyperlipidemia   . Hyperthyroidism   . PVD (peripheral vascular disease) ([facility])     Family History  Problem Relation Age of Onset  . Heart disease Other     Past Surgical History:  Procedure Laterality Date  . ABDOMINAL AORTIC ANEURYSM REPAIR    . ABOVE KNEE LEG AMPUTATION Left   . AMPUTATION Right 03/11/2020   Procedure: RIGHT TRANSMETATARSAL AMPUTATION;  Surgeon: Newt Minion, MD;  Location: [place];  Service: Orthopedics;  Laterality: Right;  . BOWEL RESECTION     for dead bowel, s/p open abdomen  . COLOSTOMY    . GASTROSTOMY    . left leg amputation    . PERICARDIAL FLUID DRAINAGE     Placed for tamponade and removed during the [facility] hospitalization   Social History   Occupational History  . Not on file  Tobacco Use  . Smoking status: Former Smoker    Quit date: 09/26/2019    Years since quitting: 0.5  . Smokeless tobacco: Never Used  Substance and Sexual Activity  . Alcohol use: Yes  . Drug use: Not on file  . Sexual activity: Not on file

## 2020-04-28 ENCOUNTER — Telehealth: Payer: Self-pay

## 2020-04-28 NOTE — Telephone Encounter (Signed)
Curtis Solis from [facility] called she is requesting clarification regarding shrinker patient received in the last visit she stated there were no written instructions she is requesting the order to be faxed or to be given to patient during his appointment on Thursday, Curtis Solis also wants to know if patient can start therapy there if so she is requesting a order to be faxed as well call back:[phone] [phone]

## 2020-04-29 NOTE — Telephone Encounter (Signed)
This pt has an appt in the office tomorrow will hold message and address while in office. I have spoken to the nurse we have sent pt back with detailed instructions will address again in office tomorrow.

## 2020-04-30 ENCOUNTER — Encounter: Payer: Self-pay | Admitting: Orthopedic Surgery

## 2020-04-30 ENCOUNTER — Ambulatory Visit (INDEPENDENT_AMBULATORY_CARE_PROVIDER_SITE_OTHER): Payer: BLUE CROSS/BLUE SHIELD | Admitting: Physician Assistant

## 2020-04-30 DIAGNOSIS — M8600 Acute hematogenous osteomyelitis, unspecified site: Secondary | ICD-10-CM

## 2020-04-30 NOTE — Progress Notes (Signed)
Office Visit Note   Patient: Curtis Solis           Date of Birth: 1955/07/31           MRN: [id number] Visit Date: 04/30/2020              Requested by: Ma Rings, MD [address] [place],  [place] [address] PCP: Ma Rings, MD  Chief Complaint  Patient presents with  . Right Foot - Routine Post Op    03/11/20 right transmet amputation       HPI: Patient presents today he is status post right transmetatarsal amputation approximately 8 weeks ago.  He is also status post a left above-knee amputation last year.  He is in a nursing facility but is hopeful to discharge to family near [place] he did obtain some large shrinkers to use on the transmetatarsal amputation stump.  The physical therapist at his facility thought these were supposed to be used on the left side and is wondering why we are using them  Assessment & Plan: Visit Diagnoses: No diagnosis found.  Plan: Explained to the patient the shrinker is for the right side.  I have provided prescriptions for a prosthetic for the left AKA and a filler and carbon fiber plate for the right TMA.  He may wash the amputation stumps with antibacterial soap and water.  The rest of the sutures were harvested today  Follow-Up Instructions: No follow-ups on file.   Ortho Exam  Patient is alert, oriented, no adenopathy, well-dressed, normal affect, normal respiratory effort. Left above-knee amputation completely healed right transmetatarsal amputation stump well apposed wound edges he does have some dryness and scabbing of the skin.  Remaining sutures were harvested no ascending cellulitis or signs of infection  Imaging: No results found. No images are attached to the encounter.  Labs: Lab Results  Component Value Date   HGBA1C 5.1 03/05/2020   ESRSEDRATE 48 (H) 03/10/2020   CRP 0.8 03/10/2020   REPTSTATUS 03/07/2020 FINAL 03/05/2020   GRAMSTAIN  03/05/2020    RARE WBC PRESENT, PREDOMINANTLY PMN MODERATE GRAM  POSITIVE COCCI Performed at [facility], [place] [address] [place], [place] [address]    CULT  03/05/2020    ABUNDANT METHICILLIN RESISTANT STAPHYLOCOCCUS AUREUS   LABORGA METHICILLIN RESISTANT STAPHYLOCOCCUS AUREUS 03/05/2020     Lab Results  Component Value Date   ALBUMIN 2.1 (L) 03/11/2020   ALBUMIN 2.0 (L) 03/10/2020   ALBUMIN 2.0 (L) 03/09/2020    Lab Results  Component Value Date   MG 2.5 (H) 03/12/2020   MG 1.5 (L) 03/11/2020   MG 1.7 03/10/2020   No results found for: VD25OH  No results found for: PREALBUMIN CBC EXTENDED Latest Ref Rng & Units 04/02/2020 03/12/2020 03/11/2020  WBC 3.4 - 10.8 x10E3/uL 7.7 18.2(H) 11.0(H)  RBC 4.14 - 5.80 x10E6/uL 3.19(L) 2.88(L) 2.65(L)  HGB 13.0 - 17.7 g/dL 9.3(L) 8.4(L) 7.7(L)  HCT 37.5 - 51.0 % 28.8(L) 26.5(L) 24.3(L)  PLT 150 - 450 x10E3/uL 264 290 259  NEUTROABS 1.7 - 7.7 K/uL - - -  LYMPHSABS 0.7 - 4.0 K/uL - - -     There is no height or weight on file to calculate BMI.  Orders:  No orders of the defined types were placed in this encounter.  No orders of the defined types were placed in this encounter.    Procedures: No procedures performed  Clinical Data: No additional findings.  ROS:  All other systems negative,  except as noted in the HPI. Review of Systems  Objective: Vital Signs: There were no vitals taken for this visit.  Specialty Comments:  No specialty comments available.  PMFS History: Patient Active Problem List   Diagnosis Date Noted  . Pericardial effusion 04/02/2020  . Atrial flutter ([place]) 04/02/2020  . Osteomyelitis ([place])   . AKI (acute kidney injury) ([place])   . PVD (peripheral vascular disease) ([place])   . Controlled type 2 diabetes mellitus with stage 4 chronic kidney disease, without long-term current use of insulin ([place]) 03/05/2020  . Hypotension 03/05/2020  . Urinary retention 03/04/2020  . 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 GERD without  esophagitis 03/04/2020  . Hyponatremia 03/04/2020  . Severe protein-calorie malnutrition ([place]) 02/04/2020  . Acute renal failure with acute tubular necrosis superimposed on stage 4 chronic kidney disease ([place]) 01/31/2020  . Hyperkalemia 01/31/2020  . Uremia 01/31/2020  . Gangrene of right foot ([place]) 01/31/2020  . Ileostomy present ([place]) 01/31/2020  . Open abdominal wall wound, subsequent encounter 01/31/2020  . Open wound of penis 01/31/2020  . Pressure injury of skin 01/31/2020  . S/P AKA (above knee amputation) unilateral, left ([place]) 10/23/2019  . 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 AAA (abdominal aortic aneurysm) ([place]) 10/06/2019  . CKD (chronic kidney disease) stage 4, GFR 15-29 ml/min ([place]) 10/06/2019   Past Medical History:  Diagnosis Date  . A-fib [facility])    During hospital stay  . CKD (chronic kidney disease)   . Hyperlipidemia   . Hyperthyroidism   . PVD (peripheral vascular disease) ([facility])     Family History  Problem Relation Age of Onset  . Heart disease Other     Past Surgical History:  Procedure Laterality Date  . ABDOMINAL AORTIC ANEURYSM REPAIR    . ABOVE KNEE LEG AMPUTATION Left   . AMPUTATION Right 03/11/2020   Procedure: RIGHT TRANSMETATARSAL AMPUTATION;  Surgeon: Newt Minion, MD;  Location: [place];  Service: Orthopedics;  Laterality: Right;  . BOWEL RESECTION     for dead bowel, s/p open abdomen  . COLOSTOMY    . GASTROSTOMY    . left leg amputation    . PERICARDIAL FLUID DRAINAGE     Placed for tamponade and removed during the [facility] hospitalization   Social History   Occupational History  . Not on file  Tobacco Use  . Smoking status: Former Smoker    Quit date: 09/26/2019    Years since quitting: 0.5  . Smokeless tobacco: Never Used  Substance and Sexual Activity  . Alcohol use: Yes  . Drug use: Not on file  . Sexual activity: Not on file

## 2020-05-01 ENCOUNTER — Other Ambulatory Visit: Payer: Self-pay

## 2020-05-01 ENCOUNTER — Ambulatory Visit (HOSPITAL_COMMUNITY): Payer: BLUE CROSS/BLUE SHIELD | Attending: Internal Medicine

## 2020-05-01 DIAGNOSIS — I3139 Other pericardial effusion (noninflammatory): Secondary | ICD-10-CM

## 2020-05-01 DIAGNOSIS — I313 Pericardial effusion (noninflammatory): Secondary | ICD-10-CM | POA: Diagnosis not present

## 2020-05-01 LAB — ECHOCARDIOGRAM COMPLETE
Area-P 1/2: 1.81 cm2
S' Lateral: 2.6 cm

## 2020-05-11 ENCOUNTER — Telehealth: Payer: Self-pay | Admitting: Physician Assistant

## 2020-05-11 NOTE — Telephone Encounter (Signed)
Order faxed to [facility] and to the facility advised for the SNF to call and make an appt for the pt.

## 2020-05-11 NOTE — Telephone Encounter (Signed)
Received call from [facility] with [facility] asked if she can get an order to schedule an appointment for the patient at [facility].  The fax# is [phone]

## 2020-05-14 ENCOUNTER — Other Ambulatory Visit: Payer: Self-pay

## 2020-05-14 ENCOUNTER — Encounter (HOSPITAL_BASED_OUTPATIENT_CLINIC_OR_DEPARTMENT_OTHER): Payer: BLUE CROSS/BLUE SHIELD | Attending: Internal Medicine | Admitting: Internal Medicine

## 2020-05-14 DIAGNOSIS — S31109A Unspecified open wound of abdominal wall, unspecified quadrant without penetration into peritoneal cavity, initial encounter: Secondary | ICD-10-CM | POA: Diagnosis not present

## 2020-05-14 DIAGNOSIS — T8189XA Other complications of procedures, not elsewhere classified, initial encounter: Secondary | ICD-10-CM | POA: Diagnosis present

## 2020-05-14 DIAGNOSIS — Z833 Family history of diabetes mellitus: Secondary | ICD-10-CM | POA: Diagnosis not present

## 2020-05-14 DIAGNOSIS — I129 Hypertensive chronic kidney disease with stage 1 through stage 4 chronic kidney disease, or unspecified chronic kidney disease: Secondary | ICD-10-CM | POA: Diagnosis not present

## 2020-05-14 DIAGNOSIS — K219 Gastro-esophageal reflux disease without esophagitis: Secondary | ICD-10-CM | POA: Diagnosis not present

## 2020-05-14 DIAGNOSIS — E43 Unspecified severe protein-calorie malnutrition: Secondary | ICD-10-CM | POA: Diagnosis not present

## 2020-05-14 DIAGNOSIS — Z89612 Acquired absence of left leg above knee: Secondary | ICD-10-CM | POA: Diagnosis not present

## 2020-05-14 DIAGNOSIS — Z87891 Personal history of nicotine dependence: Secondary | ICD-10-CM | POA: Diagnosis not present

## 2020-05-14 DIAGNOSIS — X58XXXA Exposure to other specified factors, initial encounter: Secondary | ICD-10-CM | POA: Insufficient documentation

## 2020-05-14 DIAGNOSIS — E1122 Type 2 diabetes mellitus with diabetic chronic kidney disease: Secondary | ICD-10-CM | POA: Insufficient documentation

## 2020-05-14 DIAGNOSIS — N184 Chronic kidney disease, stage 4 (severe): Secondary | ICD-10-CM | POA: Diagnosis not present

## 2020-05-14 DIAGNOSIS — I4891 Unspecified atrial fibrillation: Secondary | ICD-10-CM | POA: Insufficient documentation

## 2020-05-14 NOTE — Progress Notes (Signed)
Curtis Solis ([id number]) Visit Report for 05/14/2020 Abuse/Suicide Risk Screen Details Patient Name: Date of Service: Curtis Solis 05/14/2020 7:30 A M Medical Record Number: [id number] Patient Account Number: [id number] Date of Birth/Sex: Treating RN: 11-Jul-1955 (65 y.o. Male) Lorrin Jackson Primary Care Curtis Solis: Curtis Solis Solis Other Clinician: Referring Curtis Solis: Treating Curtis Solis/Extender: Curtis Solis Curtis Solis, Curtis Solis Curtis Solis: 0 Abuse/Suicide Risk Screen Items Answer ABUSE RISK SCREEN: Has anyone close to you tried to hurt or harm you recentlyo No Do you feel uncomfortable with anyone in your familyo No Has anyone forced you do things that you didnt want to doo No Electronic Signature(s) Signed: 05/14/2020 5:52:28 PM By: Lorrin Jackson Entered By: Lorrin Jackson on 05/14/2020 08:11:56 -------------------------------------------------------------------------------- Activities of Daily Living Details Patient Name: Date of Service: Curtis Solis 05/14/2020 7:30 [place] Record Number: [id number] Patient Account Number: [id number] Date of Birth/Sex: Treating RN: December 10, 1955 65 y.o. Male) Lorrin Jackson Primary Care Sharian Delia: Curtis Solis Solis Other Clinician: Referring Curtis Solis: Treating Curtis Solis/Extender: Curtis Solis Curtis Solis, Curtis Curtis Solis: 0 Activities of Daily Living Items Answer Activities of Daily Living (Please select one for each item) Drive Automobile Not Able T Medications ake Need Assistance Use T elephone Completely Able Care for Appearance Need Assistance Use T oilet Completely Able Bath / Shower Need Assistance Dress Self Need Assistance Feed Self Completely Able Walk Need Assistance Get In / Out Bed Need Assistance Housework Not Able Prepare Meals Not Able Handle Money Completely Able Shop for Self Completely Able Electronic Signature(s) Signed: 05/14/2020 5:52:28 PM By: Lorrin Jackson Entered By: Lorrin Jackson on 05/14/2020 08:12:38 -------------------------------------------------------------------------------- Education Screening Details Patient Name: Date of Service: Curtis Solis, Curtis Solis 05/14/2020 7:30 [place] Record Number: [id number] Patient Account Number: [id number] Date of Birth/Sex: Treating RN: 05/05/1955 (65 y.o. Male) Lorrin Jackson Primary Care Alegandra Sommers: Curtis Solis Solis Other Clinician: Referring Curtis Solis: Treating Curtis Solis/Extender: Curtis Solis, Curtis Solis Curtis Solis: 0 Primary Learner Assessed: Patient Learning Preferences/Education Level/Primary Language Learning Preference: Explanation, Demonstration, Printed Material Highest Education Level: College or Above Preferred Language: English Cognitive Barrier Language Barrier: No Translator Needed: No Memory Deficit: No Emotional Barrier: No Cultural/Religious Beliefs Affecting Medical Care: No Physical Barrier Impaired Vision: Yes Glasses Impaired Hearing: No Decreased Hand dexterity: No Knowledge/Comprehension Knowledge Level: Medium Comprehension Level: High Ability to understand written instructions: High Ability to understand verbal instructions: High Motivation Anxiety Level: Calm Cooperation: Cooperative Education Importance: Acknowledges Need Interest in Health Problems: Asks Questions Perception: Coherent Willingness to Engage in Self-Management High Activities: Readiness to Engage in Self-Management High Activities: Electronic Signature(s) Signed: 05/14/2020 5:52:28 PM By: Lorrin Jackson Entered By: Lorrin Jackson on 05/14/2020 08:13:13 -------------------------------------------------------------------------------- Fall Risk Assessment Details Patient Name: Date of Service: Curtis Solis, Curtis Solis 05/14/2020 7:30 [place] Record Number: [id number] Patient Account Number: [id number] Date of Birth/Sex: Treating RN: 31-Jan-1955 (65 y.o. Male) Lorrin Jackson Primary Care  Curtis Solis: Curtis Solis Solis Other Clinician: Referring Curtis Solis: Treating Curtis Solis/Extender: Curtis Solis Curtis Solis, Curtis Solis Curtis Solis: 0 Fall Risk Assessment Items Have you had 2 or more falls in the last 12 monthso 0 No Have you had any fall that resulted in injury in the last 12 monthso 0 No FALLS RISK SCREEN History of falling - immediate or within 3 months 0 No Secondary diagnosis (Do you have 2 or more medical diagnoseso) 0 No Ambulatory aid None/bed rest/wheelchair/nurse 0 Yes Crutches/cane/walker 0 No Furniture 0 No Intravenous therapy  Access/Saline/Heparin Lock 0 No Gait/Transferring Normal/ bed rest/ wheelchair 0 Yes Weak (short steps with or without shuffle, stooped but able to lift head while walking, may seek 10 Yes support from furniture) Impaired (short steps with shuffle, may have difficulty arising from chair, head down, impaired 0 No balance) Mental Status Oriented to own ability 0 Yes Electronic Signature(s) Signed: 05/14/2020 5:52:28 PM By: Lorrin Jackson Entered By: Lorrin Jackson on 05/14/2020 08:13:44 -------------------------------------------------------------------------------- Foot Assessment Details Patient Name: Date of Service: Curtis Solis, Curtis Solis 05/14/2020 7:30 A M Medical Record Number: [id number] Patient Account Number: [id number] Date of Birth/Sex: Treating RN: 07-Jan-1956 (65 y.o. Male) Lorrin Jackson Primary Care Curtis Solis: Curtis Solis Solis Other Clinician: Referring Curtis Solis: Treating Curtis Solis/Extender: Curtis Solis Curtis Solis, Curtis Solis Curtis Solis: 0 Foot Assessment Items Site Locations + = Sensation present, - = Sensation absent, C = Callus, U = Ulcer R = Redness, W = Warmth, M = Maceration, PU = Pre-ulcerative lesion F = Fissure, S = Swelling, D = Dryness Assessment Right: Left: Other Deformity: No No Prior Foot Ulcer: Yes No Prior Amputation: Yes No Charcot Joint: No No Ambulatory Status: Ambulatory With  Help Assistance Device: Wheelchair Gait: Steady Notes Right Transmet/ Left AKA Electronic Signature(s) Signed: 05/14/2020 5:52:28 PM By: Lorrin Jackson Entered By: Lorrin Jackson on 05/14/2020 08:18:00 -------------------------------------------------------------------------------- Nutrition Risk Screening Details Patient Name: Date of Service: Thurnell Lose Solis 05/14/2020 7:30 A M Medical Record Number: [id number] Patient Account Number: [id number] Date of Birth/Sex: Treating RN: 05-Apr-1955 (65 y.o. Male) Lorrin Jackson Primary Care Ramondo Dietze: Curtis Solis Solis Other Clinician: Referring Tirsa Gail: Treating Montel Vanderhoof/Extender: Curtis Solis Curtis Solis, Curtis Solis Curtis Solis: 0 Height (in): 71 Weight (lbs): 130 Body Mass Index (BMI): 18.1 Nutrition Risk Screening Items Score Screening NUTRITION RISK SCREEN: I have an illness or condition that made me change the kind and/or amount of food I eat 2 Yes I eat fewer than two meals per day 0 No I eat few fruits and vegetables, or milk products 0 No I have three or more drinks of beer, liquor or wine almost every day 0 No I have tooth or mouth problems that make it hard for me to eat 0 No I don't always have enough money to buy the food I need 0 No I eat alone most of the time 0 No I take three or more different prescribed or over-the-counter drugs a day 1 Yes Without wanting to, I have lost or gained 10 pounds in the last six months 2 Yes I am not always physically able to shop, cook and/or feed myself 0 No Nutrition Protocols Good Risk Protocol Moderate Risk Protocol 0 Provide education on nutrition High Risk Proctocol Risk Level: Moderate Risk Score: 5 Electronic Signature(s) Signed: 05/14/2020 5:52:28 PM By: Lorrin Jackson Entered By: Lorrin Jackson on 05/14/2020 08:14:14

## 2020-05-14 NOTE — Progress Notes (Signed)
Curtis Solis, Curtis Solis ([id number]) Visit Report for 05/14/2020 Chief Complaint Document Details Patient Name: Date of Service: Curtis Solis 05/14/2020 7:30 [place] Record Number: [id number] Patient Account Number: [id number] Date of Birth/Sex: Treating RN: [birth year] (65 y.o. Male) Curtis Solis Primary Care Provider: Angus Solis EL Other Clinician: Referring Provider: Treating Provider/Extender: Curtis Solis, MICHA EL Weeks in Treatment: 0 Information Obtained from: Patient Chief Complaint Abdominal wall wound Electronic Signature(s) Signed: 05/14/2020 3:45:45 PM By: Curtis Shan DO Entered By: Curtis Solis on 05/14/2020 11:42:58 -------------------------------------------------------------------------------- HPI Details Patient Name: Date of Service: Curtis Solis, Curtis Solis 05/14/2020 7:30 A M Medical Record Number: [id number] Patient Account Number: [id number] Date of Birth/Sex: Treating RN: 05-07-1955 (66 y.o. Male) Curtis Solis Primary Care Provider: Angus Solis EL Other Clinician: Referring Provider: Treating Provider/Extender: Curtis Solis, MICHA EL Weeks in Treatment: 0 History of Present Illness HPI Description: Admission 5/5 Curtis Solis is a 65 year old male with a past medical history of left AKA, right transmetatarsal, CKD stage IV, and severe protein calorie malnutrition with an albumin of 2.1 that presents to our clinic for an abdominal wall wound. He has been followed at the [facility] wound care center for this. He is not sure why he is following with [place] versus going back to [facility]. He states that the abdominal wound started after his AAA surgery at [facility] In September 2021. He had complications from the surgery. 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 He states he has not followed up with general  surgery. For the wound care he has only been covering it at this time. He currently denies signs of infection. Of note In February 2022 he developed osteomyelitis of the right foot and subsequently had a right transmetatarsal. Electronic Signature(s) Signed: 05/14/2020 3:45:45 PM By: Curtis Shan DO Entered By: Curtis Solis on 05/14/2020 11:58:06 -------------------------------------------------------------------------------- Physical Exam Details Patient Name: Date of Service: Curtis Solis, Curtis Solis 05/14/2020 7:30 Curtis Solis Record Number: [id number] Patient Account Number: [id number] Date of Birth/Sex: Treating RN: 09-29-1955 (65 y.o. Male) Curtis Solis Primary Care Provider: Angus Solis EL Other Clinician: Referring Provider: Treating Provider/Extender: Curtis Solis NTIL, MICHA EL Weeks in Treatment: 0 Constitutional respirations regular, non-labored and within target range for patient.Marland Kitchen Psychiatric pleasant and cooperative. Notes Abdominal wall: There are a few small open wounds with exposed mesh protruding. No signs of infection. No tenderness to palpation. Electronic Signature(s) Signed: 05/14/2020 3:45:45 PM By: Curtis Shan DO Entered By: Curtis Solis on 05/14/2020 11:58:19 -------------------------------------------------------------------------------- Physician Orders Details Patient Name: Date of Service: Curtis Solis, Curtis Solis 05/14/2020 7:30 A M Medical Record Number: [id number] Patient Account Number: [id number] Date of Birth/Sex: Treating RN: 01-Oct-1955 (65 y.o. 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 - Facility to ensure patient schedules an appointment with this vascular surgeon at [facility] related to  expose mesh in Return A abdomen. Wound Center will send referral to Dr. Evelina Bucy. Margorie John, MD [address] [place], [place] [address] [phone] (Work) [phone] (Fax) Wound Treatment Wound #1 - Abdomen - midline Cleanser: Wound Cleanser 1 x Per Day/30 Days Discharge Instructions: Cleanse the wound with wound cleanser prior to applying a clean dressing using gauze sponges, not tissue or cotton balls. Prim Dressing: FIBRACOL Plus Dressing, 2x2 in (collagen) 1 x Per Day/30 Days ary Discharge Instructions: Moisten collagen with saline or hydrogel Secondary Dressing: Woven Gauze Sponge, Non-Sterile 4x4 in 1 x Per Day/30 Days Discharge Instructions: Apply over primary dressing as directed. Secured With: 71M Medipore H Soft Cloth Surgical T 4 x 2 (in/yd) 1 x Per Day/30 Days ape Discharge Instructions: Secure dressing with tape as directed. Consults General Surgery- [facility] - Patient to follow with surgeon related to exposed mesh in abdomen wound. Margorie John, MD [address] [place], [place] [address] [phone] (Work) [phone] (Fax) Electronic Signature(s) Signed: 05/14/2020 3:45:45 PM By: Curtis Shan DO Signed: 05/14/2020 6:21:36 PM By: Curtis Solis Entered By: Curtis Solis on 05/14/2020 12:49:58 Prescription 05/14/2020 -------------------------------------------------------------------------------- Wilford Sports DO Patient Name: Provider: Oct 04, 1955 [id number] Date of Birth: NPI#: Male  [id number] Sex: DEA #: [phone] Phone #: License #: [place] Patient Address: [address] [place], [place] [address] [place], [place] [address] [phone] Allergies No Known Allergies Provider's Orders General Surgery- [facility] - Patient to follow with surgeon related to exposed mesh in abdomen wound. Margorie John, MD [address] [place],  [address] [phone] (Work) [phone] (Fax) Hand Signature: Date(s): Electronic Signature(s) Signed: 05/14/2020 3:45:45 PM By: Curtis Shan DO Signed: 05/14/2020 6:21:36 PM By: Curtis Solis Entered By: Curtis Solis on 05/14/2020 12:49:58 -------------------------------------------------------------------------------- Problem List Details Patient Name: Date of Service: Curtis Solis, Curtis Solis 05/14/2020 7:30 A M Medical Record Number: [id number] Patient Account Number: [id number] Date of Birth/Sex: Treating RN: 11/29/1955 (65 y.o. Male) Curtis Solis Primary Care Provider: Angus Solis EL Other Clinician: Referring Provider: Treating Provider/Extender: Curtis Solis, MICHA EL Weeks in Treatment: 0 Active Problems ICD-10 Encounter Code Description Active Date MDM Diagnosis S31.109A Unspecified open wound of abdominal wall, unspecified quadrant without 05/14/2020 No Yes penetration into peritoneal cavity, initial encounter Z89.612 Acquired absence of left leg above knee 05/14/2020 No Yes Z89.431 Acquired absence of right foot 05/14/2020 No Yes Z93.2 Ileostomy status 05/14/2020 No Yes E43 Unspecified severe protein-calorie malnutrition 05/14/2020 No Yes E11.22 Type 2 diabetes mellitus with diabetic chronic kidney disease 05/14/2020 No Yes N18.4 Chronic kidney disease, stage 4 (severe) 05/14/2020 No Yes Inactive Problems Resolved Problems Electronic Signature(s) Signed: 05/14/2020 3:45:45 PM By: Curtis Shan DO Entered By: Curtis Solis  on 05/14/2020 11:42:37 -------------------------------------------------------------------------------- Progress Note Details Patient Name: Date of Service: Curtis Solis, Curtis Solis 05/14/2020 7:30 A M Medical Record Number: [id number] Patient Account Number: [id number] Date of Birth/Sex: Treating RN: 01-26-1955 (65 y.o. Male) Curtis Solis Primary Care Provider: Angus Solis EL Other Clinician: Referring Provider: Treating Provider/Extender: Curtis Solis, MICHA EL Weeks in Treatment: 0 Subjective Chief Complaint Information obtained from Patient Abdominal wall wound History of Present Illness (HPI) Admission 5/5 Mr. Demello is a 65 year old male with a past medical history of left AKA, right transmetatarsal, CKD stage IV, and severe protein calorie malnutrition with an albumin of 2.1 that presents to our clinic for an abdominal wall wound. He has been followed at  the [facility] wound care center for this. He is not sure why he is following with [place] versus going back to [facility]. He states that the abdominal wound started after his AAA surgery at [facility] In September 2021. He had complications from the surgery. 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 He states he has not followed up with general surgery. For the wound care he has only been covering it at this time. He currently denies signs of infection. Of note In February 2022 he developed osteomyelitis of the right foot and subsequently had a right transmetatarsal. Patient History Information obtained from Patient. Allergies No Known Allergies Family History Diabetes - Siblings, No family history of Cancer, Heart Disease, Hereditary Spherocytosis, Hypertension, Kidney Disease, Lung Disease, Seizures, Stroke, Thyroid Problems, Tuberculosis. Social History Former smoker, Marital Status -  Married, Alcohol Use - Rarely, Drug Use - No History, Caffeine Use - Rarely. Medical History Cardiovascular Patient has history of Arrhythmia - A-Fib, Peripheral Venous Disease Endocrine Patient has history of Type II Diabetes Patient is treated with Insulin. Medical A Surgical History Notes nd Cardiovascular AAA Rupture Gastrointestinal PEG Tube / GERD Genitourinary CKD Stage 4 Review of Systems (ROS) Eyes Complains or has symptoms of Glasses / Contacts - Reading. Ear/Nose/Mouth/Throat Denies complaints or symptoms of Chronic sinus problems or rhinitis. Respiratory Denies complaints or symptoms of Chronic or frequent coughs, Shortness of Breath. Integumentary (Skin) Complains or has symptoms of Wounds. Musculoskeletal Denies complaints or symptoms of Muscle Pain, Muscle Weakness. Neurologic Denies complaints or symptoms of Numbness/parasthesias. Psychiatric Denies complaints or symptoms of Claustrophobia, Suicidal. Objective Constitutional respirations regular, non-labored and within target range for patient.. Vitals Time Taken: 7:58 AM, Height: 71 in, Source: Stated, Weight: 130 lbs, Source: Stated, BMI: 18.1, Temperature: 98.3 F, Pulse: 64 bpm, Respiratory Rate: 16 breaths/min, Blood Pressure: 124/69 mmHg, Capillary Blood Glucose: 85 mg/dl. General Notes: Glucose per patient report Psychiatric pleasant and cooperative. General Notes: Abdominal wall: There are a few small open wounds with exposed mesh protruding. No signs of infection. No tenderness to palpation. Integumentary (Hair, Skin) Wound #1 status is Open. Original cause of wound was Surgical Injury. The date acquired was: 10/21/2019. The wound is located on the Abdomen - midline. The wound measures 4cm length x 5cm width x 0.1cm depth; 15.708cm^2 area and 1.571cm^3 volume. There is Fat Layer (Subcutaneous Tissue) exposed. There is no tunneling or undermining noted. There is a medium amount of purulent drainage  noted. The wound margin is distinct with the outline attached to the wound base. There is large (67-100%) red, pink granulation within the wound bed. There is a small (1-33%) amount of necrotic tissue within the wound bed including Eschar and Adherent Slough. 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 He had a complicated hospitalization course at that time. He states he has not followed up with general surgery or vascular surgery since his discharge from the hospital. He has been seen at [facility] wound care center for his abdominal wound and they referred him to general surgery. He states he  has not been contacted for this. He states he is moving in a week to [place]. We will try and set him up Dr. Darrin Luis at [facility] who did the origianl abd ex lap. The wound does not appear infected however will not heal if there is exposed mesh throughout. This needs to be addressed. At this time will use collagen daily. Plan Follow-up Appointments: Return Appointment in 2 weeks. - Facility to ensure patient schedules an appointment with this vascular surgeon at [facility] related to expose mesh in abdomen. Wound Center will send referral to Dr. Evelina Bucy. Margorie John, MD [address] [place], [place] [address] [phone] (Work) [phone] (Fax) Consults ordered were: [place] - Patient to follow with surgeon related to exposed mesh in abdomen wound. Margorie John, MD [address] [place], [place] [address] [phone] (Work) [phone]  (Fax) WOUND #1: - Abdomen - midline Wound Laterality: Cleanser: Wound Cleanser 1 x Per Day/30 Days Discharge Instructions: Cleanse the wound with wound cleanser prior to applying a clean dressing using gauze sponges, not tissue or cotton balls. Prim Dressing: FIBRACOL Plus Dressing, 2x2 in (collagen) 1 x Per Day/30 Days ary Discharge Instructions: Moisten collagen with saline or hydrogel Secondary Dressing: Woven Gauze Sponge, Non-Sterile 4x4 in 1 x Per Day/30 Days Discharge Instructions: Apply over primary dressing as directed. Secured With: 25M Medipore H Soft Cloth Surgical T 4 x 2 (in/yd) 1 x Per Day/30 Days ape Discharge Instructions: Secure dressing with tape as directed. 1. Collagen daily 2. Follow-up in 1 week 3. General surgery referral Electronic Signature(s) Signed: 05/14/2020 3:45:45 PM By: Curtis Shan DO Signed: 05/14/2020 6:21:36 PM By: Curtis Solis Entered By: Curtis Solis on 05/14/2020 12:50:20 -------------------------------------------------------------------------------- HxROS Details Patient Name: Date of Service: Curtis Solis, Curtis Solis 05/14/2020 7:30 [place] Number: [id number] Patient Account Number: [id number] Date of Birth/Sex: Treating RN: 1955/03/05 (65 y.o. Male) Lorrin Jackson Primary Care Provider: Angus Solis EL Other Clinician: Referring Provider: Treating Provider/Extender: Curtis Solis NTIL, MICHA EL Weeks in Treatment: 0 Information Obtained From Patient Eyes Complaints and Symptoms: Positive for: Glasses / Contacts - Reading Ear/Nose/Mouth/Throat Complaints and Symptoms: Negative for: Chronic sinus problems or rhinitis Respiratory Complaints and Symptoms: Negative for: Chronic or frequent coughs; Shortness of Breath Integumentary (Skin) Complaints and Symptoms: Positive for: Wounds Musculoskeletal Complaints and Symptoms: Negative for: Muscle Pain; Muscle Weakness Neurologic Complaints and Symptoms: Negative for:  Numbness/parasthesias Psychiatric Complaints and Symptoms: Negative for: Claustrophobia; Suicidal Hematologic/Lymphatic Cardiovascular Medical History: Positive for: Arrhythmia - A-Fib; Peripheral Venous Disease Past Medical History Notes: AAA Rupture Gastrointestinal Medical History: Past Medical History Notes: PEG Tube / GERD Endocrine Medical History: Positive for: Type II Diabetes Time with diabetes: Unknown Treated with: Insulin Genitourinary Medical History: Past Medical History Notes: CKD Stage 4 Oncologic Immunizations Pneumococcal Vaccine: Received Pneumococcal Vaccination: No Implantable Devices None Family and Social History Cancer: No; Diabetes: Yes - Siblings; Heart Disease: No; Hereditary Spherocytosis: No; Hypertension: No; Kidney Disease: No; Lung Disease: No; Seizures: No; Stroke: No; Thyroid Problems: No; Tuberculosis: No; Former smoker; Marital Status - Married; Alcohol Use: Rarely; Drug Use: No History; Caffeine Use: Rarely; Financial Concerns: No; Food, Clothing or Shelter Needs: No; Support System Lacking: No; Transportation Concerns: No Electronic Signature(s) Signed: 05/14/2020 3:45:45 PM By: Curtis Shan DO Signed: 05/14/2020 5:52:28 PM By: Lorrin Jackson Entered By: Lorrin Jackson on 05/14/2020 08:11:49 -------------------------------------------------------------------------------- SuperBill Details Patient Name: Date of Service: Curtis Solis, Curtis Solis 05/14/2020 Medical Record Number: [id number] Patient Account Number: [id number] Date  of Birth/Sex: Treating RN: [birth year] (65 y.o. 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 Chronic kidney disease, stage 4 (severe) Facility Procedures CPT4 Code: [id number] Description: [id number] - WOUND CARE VISIT-LEV 5 EST PT Modifier: Quantity: 1 Physician Procedures : CPT4 Code Description Modifier [id number] - WC PHYS LEVEL 4 - NEW PT ICD-10 Diagnosis Description S31.109A Unspecified open wound of abdominal wall, unspecified quadrant without penetration into peritoneal cavit encounter E43 Unspecified severe  protein-calorie malnutrition Quantity: 1 y, initial Electronic Signature(s) Signed: 05/14/2020 3:45:45 PM By: Curtis Shan DO Entered By: Curtis Solis on 05/14/2020 12:07:07

## 2020-05-15 NOTE — Progress Notes (Addendum)
Curtis, Solis ([id number]) Visit Report for 05/14/2020 Allergy List Details Patient Name: Date of Service: Curtis Solis 05/14/2020 7:30 [place] Record Number: [id number] Patient Account Number: [id number] Date of Birth/Sex: Treating RN: [birth year] (65 y.o. Male) Lorrin Jackson Primary Care Curtis Solis: Curtis Solis Other Clinician: Referring Curtis Solis Curtis Solis/Extender: Curtis Solis, Curtis Solis Weeks in Treatment: 0 Allergies Active Allergies No Known Allergies Allergy Notes Electronic Signature(s) Signed: 05/14/2020 5:52:28 PM By: Lorrin Jackson Entered By: Lorrin Jackson on 05/14/2020 08:04:18 -------------------------------------------------------------------------------- Arrival Information Details Patient Name: Date of Service: Curtis Solis 05/14/2020 7:30 A M Medical Record Number: [id number] Patient Account Number: [id number] Date of Birth/Sex: Treating RN: 1955-11-09 (65 y.o. Male) Lorrin Jackson Primary Care Curtis Solis: Curtis Solis Other Clinician: Referring Curtis Solis: Solis Curtis Solis/Extender: Curtis Solis Weeks in Treatment: 0 Visit Information Patient Arrived: Wheel Chair Arrival Time: 07:58 Transfer Assistance: Manual Patient Identification Verified: Solis Secondary Verification Process Completed: Solis Patient Requires Transmission-Based Precautions: No Patient Has Alerts: No Electronic Signature(s) Signed: 05/14/2020 5:52:28 PM By: Lorrin Jackson Entered By: Lorrin Jackson on 05/14/2020 07:58:38 -------------------------------------------------------------------------------- Clinic Level of Care Assessment Details Patient Name: Date of Service: Curtis Solis, Curtis Solis 05/14/2020 7:30 A M Medical Record Number: [id number] Patient Account Number: [id number] Date of Birth/Sex: Treating RN: 12/28/1955 (65 y.o. Male) Curtis Solis Primary Care Curtis Solis: Other Clinician: Angus Seller Solis Referring  Curtis Solis: Solis Curtis Solis Weeks in Treatment: 0 Clinic Level of Care Assessment Items TOOL 2 Quantity Score X- 1 0 Use when only an EandM is performed on the INITIAL visit ASSESSMENTS - Nursing Assessment / Reassessment X- 1 20 General Physical Exam (combine w/ comprehensive assessment (listed just below) when performed on new pt. evals) X- 1 25 Comprehensive Assessment (HX, ROS, Risk Assessments, Wounds Hx, etc.) ASSESSMENTS - Wound and Skin A ssessment / Reassessment '[]'$  - 0 Simple Wound Assessment / Reassessment - one wound X- 1 5 Complex Wound Assessment / Reassessment - multiple wounds X- 1 10 Dermatologic / Skin Assessment (not related to wound area) ASSESSMENTS - Ostomy and/or Continence Assessment and Care '[]'$  - 0 Incontinence Assessment and Management '[]'$  - 0 Ostomy Care Assessment and Management (repouching, etc.) PROCESS - Coordination of Care '[]'$  - 0 Simple Patient / Family Education for ongoing care X- 1 20 Complex (extensive) Patient / Family Education for ongoing care X- 1 10 Staff obtains Programmer, systems, Records, T Results / Process Orders est X- 1 10 Staff telephones HHA, Nursing Homes / Clarify orders / etc '[]'$  - 0 Routine Transfer to another Facility (non-emergent condition) '[]'$  - 0 Routine Hospital Admission (non-emergent condition) X- 1 15 New Admissions / Biomedical engineer / Ordering NPWT Apligraf, etc. , '[]'$  - 0 Emergency Hospital Admission (emergent condition) '[]'$  - 0 Simple Discharge Coordination X- 1 15 Complex (extensive) Discharge Coordination PROCESS - Special Needs '[]'$  - 0 Pediatric / Minor Patient Management '[]'$  - 0 Isolation Patient Management '[]'$  - 0 Hearing / Language / Visual special needs '[]'$  - 0 Assessment of Community assistance (transportation, D/C planning, etc.) '[]'$  - 0 Additional assistance / Altered mentation '[]'$  - 0 Support Surface(s) Assessment (bed, cushion, seat,  etc.) INTERVENTIONS - Wound Cleansing / Measurement X- 1 5 Wound Imaging (photographs - any number of wounds) '[]'$  - 0 Wound Tracing (instead of photographs) X- 1 5 Simple Wound Measurement - one wound '[]'$  - 0 Complex Wound Measurement - multiple wounds X- 1 5 Simple Wound Cleansing -  one wound '[]'$  - 0 Complex Wound Cleansing - multiple wounds INTERVENTIONS - Wound Dressings '[]'$  - 0 Small Wound Dressing one or multiple wounds X- 1 15 Medium Wound Dressing one or multiple wounds '[]'$  - 0 Large Wound Dressing one or multiple wounds '[]'$  - 0 Application of Medications - injection INTERVENTIONS - Miscellaneous '[]'$  - 0 External ear exam '[]'$  - 0 Specimen Collection (cultures, biopsies, blood, body fluids, etc.) '[]'$  - 0 Specimen(s) / Culture(s) sent or taken to Lab for analysis '[]'$  - 0 Patient Transfer (multiple staff / [name] Lift / Similar devices) '[]'$  - 0 Simple Staple / Suture removal (25 or less) '[]'$  - 0 Complex Staple / Suture removal (26 or more) '[]'$  - 0 Hypo / Hyperglycemic Management (close monitor of Blood Glucose) '[]'$  - 0 Ankle / Brachial Index (ABI) - do not check if billed separately Has the patient been seen at the hospital within the last three years: Solis Total Score: 160 Level Of Care: New/Established - Level 5 Electronic Signature(s) Signed: 05/14/2020 6:21:36 PM By: Curtis Solis Entered By: Curtis Solis on 05/14/2020 09:19:33 -------------------------------------------------------------------------------- Encounter Discharge Information Details Patient Name: Date of Service: Curtis Solis 05/14/2020 7:30 A M Medical Record Number: [id number] Patient Account Number: [id number] Date of Birth/Sex: Treating RN: 1955-04-25 (65 y.o. Male) Curtis Solis Primary Care Shelina Luo: Curtis Solis Other Clinician: Referring Curtis Solis: Solis Curtis Solis Weeks in Treatment: 0 Encounter Discharge Information Items Discharge  Condition: Stable Ambulatory Status: Wheelchair Discharge Destination: Curtis Solis Transportation: Other Accompanied By: self Schedule Follow-up Appointment: Solis Clinical Summary of Care: Patient Declined Notes transportation service Electronic Signature(s) Signed: 05/15/2020 5:11:03 PM By: Curtis Gouty RN, BSN Entered By: Curtis Solis on 05/14/2020 09:39:39 -------------------------------------------------------------------------------- Lower Extremity Assessment Details Patient Name: Date of Service: Curtis Solis, Curtis Solis 05/14/2020 7:30 A M Medical Record Number: [id number] Patient Account Number: [id number] Date of Birth/Sex: Treating RN: 05-Feb-1955 (64 y.o. Male) Lorrin Jackson Primary Care Clarissia Mckeen: Curtis Solis Other Clinician: Referring Kym Fenter: Solis Joshau Code/Extender: Curtis Solis, Curtis Solis Weeks in Treatment: 0 Edema Assessment Assessed: [Left: No] [Right: Solis] Edema: [Left: N] [Right: o] Calf Left: Right: Point of Measurement: 34 cm From Medial Instep 26 cm Ankle Left: Right: Point of Measurement: 9 cm From Medial Instep 18 cm Vascular Assessment Pulses: Dorsalis Pedis Palpable: [Right:Solis] Electronic Signature(s) Signed: 05/14/2020 5:52:28 PM By: Lorrin Jackson Entered By: Lorrin Jackson on 05/14/2020 08:19:26 -------------------------------------------------------------------------------- Multi Wound Chart Details Patient Name: Date of Service: Curtis Solis 05/14/2020 7:30 A M Medical Record Number: [id number] Patient Account Number: [id number] Date of Birth/Sex: Treating RN: [birth year] (65 y.o. Male) Curtis Solis Primary Care Corinda Ammon: Curtis Solis Other Clinician: Referring Shereda Graw: Solis Baltasar Twilley/Extender: Curtis Solis, Curtis Solis Weeks in Treatment: 0 Vital Signs Height(in): 71 Capillary Blood Glucose(mg/dl): 85 Weight(lbs): 130 Pulse(bpm): 64 Body Mass  Index(BMI): 18 Blood Pressure(mmHg): 124/69 Temperature(F): 98.3 Respiratory Rate(breaths/min): 16 Photos: [1:No Photos Abdomen - midline] [N/A:N/A N/A] Wound Location: [1:Surgical Injury] [N/A:N/A] Wounding Event: [1:Open Surgical Wound] [N/A:N/A] Primary Etiology: [1:Arrhythmia, Peripheral Venous] [N/A:N/A] Comorbid History: [1:Disease, Type II Diabetes 10/21/2019] [N/A:N/A] Date Acquired: [1:0] [N/A:N/A] Weeks of Treatment: [1:Open] [N/A:N/A] Wound Status: [1:4x5x0.1] [N/A:N/A] Measurements L x W x D (cm) [1:15.708] [N/A:N/A] A (cm) : rea [1:1.571] [N/A:N/A] Volume (cm) : [1:Full Thickness Without Exposed] [N/A:N/A] Classification: [1:Support Structures Medium] [N/A:N/A] Exudate A mount: [1:Purulent] [N/A:N/A] Exudate Type: [1:yellow, brown, green] [N/A:N/A] Exudate Color: [1:Distinct, outline  attached] [N/A:N/A] Wound Margin: [1:Large (67-100%)] [N/A:N/A] Granulation Amount: [1:Red, Pink] [N/A:N/A] Granulation Quality: [1:Small (1-33%)] [N/A:N/A] Necrotic Amount: [1:Eschar, Adherent Slough] [N/A:N/A] Necrotic Tissue: [1:Fat Layer (Subcutaneous Tissue): Solis N/A] Exposed Structures: [1:Fascia: No Tendon: No Muscle: No Joint: No Bone: No None] [N/A:N/A] Treatment Notes Wound #1 (Abdomen - midline) Cleanser Wound Cleanser Discharge Instruction: Cleanse the wound with wound cleanser prior to applying a clean dressing using gauze sponges, not tissue or cotton balls. Peri-Wound Care Topical Primary Dressing FIBRACOL Plus Dressing, 2x2 in (collagen) Discharge Instruction: Moisten collagen with saline or hydrogel Secondary Dressing Woven Gauze Sponge, Non-Sterile 4x4 in Discharge Instruction: Apply over primary dressing as directed. Secured With 80M Medipore H Soft Cloth Surgical T 4 x 2 (in/yd) ape Discharge Instruction: Secure dressing with tape as directed. Compression Wrap Compression Stockings Add-Ons Electronic Signature(s) Signed: 05/14/2020 3:45:45 PM By: Kalman Shan DO Signed: 05/14/2020 6:21:36 PM By: Curtis Solis Entered By: Kalman Shan on 05/14/2020 11:42:43 -------------------------------------------------------------------------------- Multi-Disciplinary Care Plan Details Patient Name: Date of Service: Curtis Solis 05/14/2020 7:30 A M Medical Record Number: [id number] Patient Account Number: [id number] Date of Birth/Sex: Treating RN: [birth year] (65 y.o. Male) Curtis Solis Primary Care Anna Livers: Curtis Solis Other Clinician: Referring Oscar Forman: Solis Arnella Pralle/Extender: Curtis Solis Weeks in Treatment: 0 Active Inactive Electronic Signature(s) Signed: 07/27/2020 9:22:46 PM By: Curtis Solis Signed: 07/31/2020 2:03:23 PM By: Curtis Gouty RN, BSN Previous Signature: 05/14/2020 6:21:36 PM Version By: Curtis Solis Entered By: Curtis Solis on 07/02/2020 10:14:10 -------------------------------------------------------------------------------- Pain Assessment Details Patient Name: Date of Service: Curtis Solis 05/14/2020 7:30 [place] Record Number: [id number] Patient Account Number: [id number] Date of Birth/Sex: Treating RN: [birth year] (65 y.o. Male) Lorrin Jackson Primary Care Madalyne Husk: Curtis Solis Other Clinician: Referring Lana Flaim: Solis Dianey Suchy/Extender: Curtis Solis, Curtis Solis Weeks in Treatment: 0 Active Problems Location of Pain Severity and Description of Pain Patient Has Paino No Site Locations Pain Management and Medication Current Pain Management: Electronic Signature(s) Signed: 05/14/2020 5:52:28 PM By: Lorrin Jackson Entered By: Lorrin Jackson on 05/14/2020 08:27:17 -------------------------------------------------------------------------------- Patient/Caregiver Education Details Patient Name: Date of Service: Curtis Solis 5/5/2022andnbsp7:30 [place] Record Number: [id number] Patient Account Number: [id number] Date of  Birth/Gender: Treating RN: [birth year] (64 y.o. Male) Curtis Solis Primary Care Physician: Curtis Solis Other Clinician: Referring Physician: Treating Physician/Extender: Curtis Solis Weeks in Treatment: 0 Education Assessment Education Provided To: Patient Education Topics Provided [place]: o Handouts: Welcome T [facility] [place] o Methods: Explain/Verbal Responses: Reinforcements needed Electronic Signature(s) Signed: 05/14/2020 6:21:36 PM By: Curtis Solis Entered By: Curtis Solis on 05/14/2020 07:56:12 -------------------------------------------------------------------------------- Wound Assessment Details Patient Name: Date of Service: Curtis Solis 05/14/2020 7:30 A M Medical Record Number: [id number] Patient Account Number: [id number] Date of Birth/Sex: Treating RN: Aug 16, 1955 (65 y.o. Male) Lorrin Jackson Primary Care Judy Pollman: Curtis Solis Other Clinician: Referring Nga Rabon: Solis [place]/Extender: Curtis Solis Weeks in Treatment: 0 Wound Status Wound Number: 1 Primary Etiology: Open Surgical Wound Wound Location: Abdomen - midline Wound Status: Open Wounding Event: Surgical Injury Comorbid History: Arrhythmia, Peripheral Venous Disease, Type II Diabetes Date Acquired: 10/21/2019 Weeks Of Treatment: 0 Clustered Wound: No Photos Wound Measurements Length: (cm) 4 Width: (cm) 5 Depth: (cm) 0.1 Area: (cm) 15.708 Volume: (cm) 1.571 % Reduction in Area: 0% % Reduction in Volume: 0% Epithelialization: None Tunneling: No Undermining: No Wound Description Classification: Full  Thickness Without Exposed Support Structures Wound Margin: Distinct, outline attached Exudate Amount: Medium Exudate Type: Purulent Exudate Color: yellow, brown, green Foul Odor After Cleansing: No Slough/Fibrino Solis Wound Bed Granulation Amount: Large (67-100%) Exposed Structure Granulation  Quality: Red, Pink Fascia Exposed: No Necrotic Amount: Small (1-33%) Fat Layer (Subcutaneous Tissue) Exposed: Solis Necrotic Quality: Eschar, Adherent Slough Tendon Exposed: No Muscle Exposed: No Joint Exposed: No Bone Exposed: No Electronic Signature(s) Signed: 05/14/2020 5:12:45 PM By: Sandre Kitty Signed: 05/14/2020 5:52:28 PM By: Lorrin Jackson Entered By: Sandre Kitty on 05/14/2020 16:32:23 -------------------------------------------------------------------------------- [place] Details Patient Name: Date of Service: Curtis Solis 05/14/2020 7:30 [place] Number: [id number] Patient Account Number: [id number] Date of Birth/Sex: Treating RN: May 05, 1955 (65 y.o. Male) Lorrin Jackson Primary Care Nickayla Mcinnis: Curtis Solis Other Clinician: Referring Seira Cody: Solis Dona Walby/Extender: Curtis Solis, Curtis Solis Weeks in Treatment: 0 Vital Signs Time Taken: 07:58 Temperature (F): 98.3 Height (in): 71 Pulse (bpm): 64 Source: Stated Respiratory Rate (breaths/min): 16 Weight (lbs): 130 Blood Pressure (mmHg): 124/69 Source: Stated Capillary Blood Glucose (mg/dl): 85 Body Mass Index (BMI): 18.1 Reference Range: 80 - 120 mg / dl Notes Glucose per patient report Electronic Signature(s) Signed: 05/14/2020 5:52:28 PM By: Lorrin Jackson Entered By: Lorrin Jackson on 05/14/2020 08:02:42

## 2020-05-19 ENCOUNTER — Other Ambulatory Visit: Payer: Self-pay | Admitting: Physician Assistant

## 2020-05-19 ENCOUNTER — Telehealth: Payer: Self-pay | Admitting: Physician Assistant

## 2020-05-19 ENCOUNTER — Telehealth: Payer: Self-pay

## 2020-05-19 MED ORDER — OXYCODONE-ACETAMINOPHEN 5-325 MG PO TABS: 1.0000 | ORAL_TABLET | Freq: Three times a day (TID) | ORAL | 0 refills | Status: AC | PRN

## 2020-05-19 NOTE — Telephone Encounter (Signed)
I spoke with the patient.  I explained to him that we will call in 5 mg Percocets 3 times daily.  He is approaching 3 months since surgery so he understands this will be most likely the only prescription we can send.  If he needs something beyond that we would refer him to chronic pain management

## 2020-05-19 NOTE — Telephone Encounter (Signed)
Patient called needing Rx filled (Oxycodone) [facility] uses [facility] on [address]. Patient will be leaving the rehab facility on Thursday. The number to contact patient is [phone]

## 2020-05-19 NOTE — Telephone Encounter (Signed)
Cover my meds prior auth for Oxycodone 5/325 complete with favorable outcome. Approved effective 05/19/20 through 06/17/2020

## 2020-05-28 ENCOUNTER — Encounter (HOSPITAL_BASED_OUTPATIENT_CLINIC_OR_DEPARTMENT_OTHER): Payer: BLUE CROSS/BLUE SHIELD | Admitting: Internal Medicine

## 2020-05-28 ENCOUNTER — Ambulatory Visit: Payer: BLUE CROSS/BLUE SHIELD | Admitting: Physician Assistant

## 2020-06-03 ENCOUNTER — Ambulatory Visit: Payer: BLUE CROSS/BLUE SHIELD | Admitting: Physician Assistant

## 2020-06-11 ENCOUNTER — Encounter: Payer: Self-pay | Admitting: Orthopedic Surgery

## 2020-06-11 ENCOUNTER — Ambulatory Visit (INDEPENDENT_AMBULATORY_CARE_PROVIDER_SITE_OTHER): Payer: BLUE CROSS/BLUE SHIELD | Admitting: Physician Assistant

## 2020-06-11 DIAGNOSIS — M8600 Acute hematogenous osteomyelitis, unspecified site: Secondary | ICD-10-CM

## 2020-06-11 NOTE — Progress Notes (Signed)
Office Visit Note   Patient: Curtis Solis           Date of Birth: Mar 30, 1955           MRN: [id number] Visit Date: 06/11/2020              Requested by: Ma Rings, MD [address] [place],  [place] [address] PCP: Ma Rings, MD  Chief Complaint  Patient presents with  . Right Foot - Follow-up      HPI: Patient presents in follow-up today he is status post right transmetatarsal amputation.  He was recently in the hospital and bumped his foot.  He now has a little increased pain over the medial side of the amputation.  He is due to see [facility] to receive his prosthetic and his shoes for the right  Assessment & Plan: Visit Diagnoses: No diagnosis found.  Plan: Wash med amputation stump daily with antibacterial soap and water.  Should be using the shrinker that was provided to him.  Follow-up in 3 weeks.  Can apply Band-Aid with small amount of antibacterial.   or Neosporin. We will give him a prescription for physical therapy at his next visit.  He lives 2 hours away in we will have to do therapy closer to home Follow-Up Instructions: No follow-ups on file.   Ortho Exam  Patient is alert, oriented, no adenopathy, well-dressed, normal affect, normal respiratory effort. Examination of the transmetatarsal amputation stump is completely healed he has 1 area of skin tearing on the medial side and he is tender to palpation however there is no swelling no ascending cellulitis no signs of infection  Imaging: No results found. No images are attached to the encounter.  Labs: Lab Results  Component Value Date   HGBA1C 5.1 03/05/2020   ESRSEDRATE 48 (H) 03/10/2020   CRP 0.8 03/10/2020   REPTSTATUS 03/07/2020 FINAL 03/05/2020   GRAMSTAIN  03/05/2020    RARE WBC PRESENT, PREDOMINANTLY PMN MODERATE GRAM POSITIVE COCCI Performed at [facility], [place] [address] [place], [place] [address]    CULT  03/05/2020    ABUNDANT METHICILLIN RESISTANT STAPHYLOCOCCUS  AUREUS   LABORGA METHICILLIN RESISTANT STAPHYLOCOCCUS AUREUS 03/05/2020     Lab Results  Component Value Date   ALBUMIN 2.1 (L) 03/11/2020   ALBUMIN 2.0 (L) 03/10/2020   ALBUMIN 2.0 (L) 03/09/2020    Lab Results  Component Value Date   MG 2.5 (H) 03/12/2020   MG 1.5 (L) 03/11/2020   MG 1.7 03/10/2020   No results found for: VD25OH  No results found for: PREALBUMIN CBC EXTENDED Latest Ref Rng & Units 04/02/2020 03/12/2020 03/11/2020  WBC 3.4 - 10.8 x10E3/uL 7.7 18.2(H) 11.0(H)  RBC 4.14 - 5.80 x10E6/uL 3.19(L) 2.88(L) 2.65(L)  HGB 13.0 - 17.7 g/dL 9.3(L) 8.4(L) 7.7(L)  HCT 37.5 - 51.0 % 28.8(L) 26.5(L) 24.3(L)  PLT 150 - 450 x10E3/uL 264 290 259  NEUTROABS 1.7 - 7.7 K/uL - - -  LYMPHSABS 0.7 - 4.0 K/uL - - -     There is no height or weight on file to calculate BMI.  Orders:  No orders of the defined types were placed in this encounter.  No orders of the defined types were placed in this encounter.    Procedures: No procedures performed  Clinical Data: No additional findings.  ROS:  All other systems negative, except as noted in the HPI. Review of Systems  Objective: Vital Signs: There were no vitals taken for this visit.  Specialty Comments:  No specialty comments available.  PMFS History: Patient Active Problem List   Diagnosis Date Noted  . Pericardial effusion 04/02/2020  . Atrial flutter ([place]) 04/02/2020  . Osteomyelitis ([place])   . AKI (acute kidney injury) ([place])   . PVD (peripheral vascular disease) ([place])   . Controlled type 2 diabetes mellitus with stage 4 chronic kidney disease, without long-term current use of insulin ([place]) 03/05/2020  . Hypotension 03/05/2020  . Urinary retention 03/04/2020  . 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 GERD without esophagitis 03/04/2020  . Hyponatremia 03/04/2020  . Severe protein-calorie malnutrition ([place]) 02/04/2020  . Acute renal failure with acute tubular necrosis  superimposed on stage 4 chronic kidney disease ([place]) 01/31/2020  . Hyperkalemia 01/31/2020  . Uremia 01/31/2020  . Gangrene of right foot ([place]) 01/31/2020  . Ileostomy present ([place]) 01/31/2020  . Open abdominal wall wound, subsequent encounter 01/31/2020  . Open wound of penis 01/31/2020  . Pressure injury of skin 01/31/2020  . S/P AKA (above knee amputation) unilateral, left ([place]) 10/23/2019  . 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 AAA (abdominal aortic aneurysm) ([place]) 10/06/2019  . CKD (chronic kidney disease) stage 4, GFR 15-29 ml/min ([place]) 10/06/2019   Past Medical History:  Diagnosis Date  . A-fib [facility])    During hospital stay  . CKD (chronic kidney disease)   . Hyperlipidemia   . Hyperthyroidism   . PVD (peripheral vascular disease) ([facility])     Family History  Problem Relation Age of Onset  . Heart disease Other     Past Surgical History:  Procedure Laterality Date  . ABDOMINAL AORTIC ANEURYSM REPAIR    . ABOVE KNEE LEG AMPUTATION Left   . AMPUTATION Right 03/11/2020   Procedure: RIGHT TRANSMETATARSAL AMPUTATION;  Surgeon: Newt Minion, MD;  Location: [facility];  Service: Orthopedics;  Laterality: Right;  . BOWEL RESECTION     for dead bowel, s/p open abdomen  . COLOSTOMY    . GASTROSTOMY    . left leg amputation    . PERICARDIAL FLUID DRAINAGE     Placed for tamponade and removed during the [facility] hospitalization   Social History   Occupational History  . Not on file  Tobacco Use  . Smoking status: Former Smoker    Quit date: 09/26/2019    Years since quitting: 0.7  . Smokeless tobacco: Never Used  Substance and Sexual Activity  . Alcohol use: Yes  . Drug use: Not on file  . Sexual activity: Not on file

## 2020-07-02 ENCOUNTER — Ambulatory Visit (INDEPENDENT_AMBULATORY_CARE_PROVIDER_SITE_OTHER): Payer: BLUE CROSS/BLUE SHIELD | Admitting: Physician Assistant

## 2020-07-02 ENCOUNTER — Encounter: Payer: Self-pay | Admitting: Orthopedic Surgery

## 2020-07-02 DIAGNOSIS — M8600 Acute hematogenous osteomyelitis, unspecified site: Secondary | ICD-10-CM

## 2020-07-02 NOTE — Progress Notes (Signed)
Office Visit Note   Patient: Curtis Solis           Date of Birth: [birth year]           MRN: [id number] Visit Date: 07/02/2020              Requested by: Ma Rings, MD [address] [place],  [place] [address] PCP: Ma Rings, MD  Chief Complaint  Patient presents with   Right Foot - Follow-up    03/11/20 right transmet amputation       HPI: Patient is 3-1/91-monthstatus post right transmetatarsal amputation.  He is also status post left above-knee amputation.  He is doing well and has no complaints.  He lives quite a ways away and he is doing home physical therapy he plans on obtaining his shoe and his prosthetic for the next few weeks  Assessment & Plan: Visit Diagnoses: No diagnosis found.  Plan: Continue physical therapy follow-up for final visit in 2 months  Follow-Up Instructions: No follow-ups on file.   Ortho Exam  Patient is alert, oriented, no adenopathy, well-dressed, normal affect, normal respiratory effort. Right transmetatarsal amputation stump well-healed no swelling no pressure areas.  No cellulitis or signs of infection he comes almost up to neutral of dorsiflexion reiterated the importance of doing some Achilles stretching  Imaging: No results found.   Labs: Lab Results  Component Value Date   HGBA1C 5.1 03/05/2020   ESRSEDRATE 48 (H) 03/10/2020   CRP 0.8 03/10/2020   REPTSTATUS 03/07/2020 FINAL 03/05/2020   GRAMSTAIN  03/05/2020    RARE WBC PRESENT, PREDOMINANTLY PMN MODERATE GRAM POSITIVE COCCI Performed at [facility] [address] [id number]   CULT  03/05/2020    ABUNDANT METHICILLIN RESISTANT STAPHYLOCOCCUS AUREUS   LABORGA METHICILLIN RESISTANT STAPHYLOCOCCUS AUREUS 03/05/2020     Lab Results  Component Value Date   ALBUMIN 2.1 (L) 03/11/2020   ALBUMIN 2.0 (L) 03/10/2020   ALBUMIN 2.0 (L) 03/09/2020    Lab Results  Component Value Date   MG 2.5 (H) 03/12/2020   MG 1.5 (L) 03/11/2020    MG 1.7 03/10/2020   No results found for: VD25OH  No results found for: PREALBUMIN CBC EXTENDED Latest Ref Rng & Units 04/02/2020 03/12/2020 03/11/2020  WBC 3.4 - 10.8 x10E3/uL 7.7 18.2(H) 11.0(H)  RBC 4.14 - 5.80 x10E6/uL 3.19(L) 2.88(L) 2.65(L)  HGB 13.0 - 17.7 g/dL 9.3(L) 8.4(L) 7.7(L)  HCT 37.5 - 51.0 % 28.8(L) 26.5(L) 24.3(L)  PLT 150 - 450 x10E3/uL 264 290 259  NEUTROABS 1.7 - 7.7 K/uL - - -  LYMPHSABS 0.7 - 4.0 K/uL - - -     There is no height or weight on file to calculate BMI.  Orders:  No orders of the defined types were placed in this encounter.  No orders of the defined types were placed in this encounter.    Procedures: No procedures performed  Clinical Data: No additional findings.  ROS:  All other systems negative, except as noted in the HPI. Review of Systems  Objective: Vital Signs: There were no vitals taken for this visit.  Specialty Comments:  No specialty comments available.  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 ([place]) 03/04/2020   GERD without esophagitis 03/04/2020   Hyponatremia 03/04/2020   Severe protein-calorie malnutrition ([place]) 02/04/2020   Acute renal failure with acute tubular necrosis superimposed on stage 4 chronic kidney disease ([place]) 01/31/2020   Hyperkalemia 01/31/2020   Uremia 01/31/2020   Gangrene of right foot ([place]) 01/31/2020   Ileostomy present ([place]) 01/31/2020   Open abdominal wall wound, subsequent encounter 01/31/2020   Open wound of penis 01/31/2020   Pressure injury of skin  01/31/2020   S/P AKA (above knee amputation) unilateral, left ([place]) 10/23/2019   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[place]) 10/10/2019   AAA (abdominal aortic aneurysm) ([place]) 10/06/2019   CKD (chronic kidney disease) stage 4, GFR 15-29 ml/min ([place]) 10/06/2019   Past Medical History:  Diagnosis Date   A-fib [facility])    During hospital stay   CKD (chronic kidney disease)    Hyperlipidemia    Hyperthyroidism    PVD (peripheral vascular disease) ([place])     Family History  Problem Relation Age of Onset   Heart disease Other     Past Surgical History:  Procedure Laterality Date   ABDOMINAL AORTIC ANEURYSM REPAIR     ABOVE KNEE LEG AMPUTATION Left    AMPUTATION Right 03/11/2020   Procedure: RIGHT TRANSMETATARSAL AMPUTATION;  Surgeon: Newt Minion, MD;  Location: [facility];  Service: Orthopedics;  Laterality: Right;   BOWEL RESECTION     for dead bowel, s/p open abdomen   COLOSTOMY     GASTROSTOMY     left leg amputation     PERICARDIAL FLUID DRAINAGE     Placed for tamponade and removed during the [facility] hospitalization   Social History   Occupational History   Not on file  Tobacco Use   Smoking status: Former    Pack years: 0.00    Types: Cigarettes    Quit date: 09/26/2019    Years since quitting: 0.7   Smokeless tobacco: Never  Substance and Sexual Activity   Alcohol use: Yes   Drug use: Not on file   Sexual activity: Not on file

## 2020-07-03 ENCOUNTER — Telehealth: Payer: Self-pay | Admitting: Physician Assistant

## 2020-09-03 ENCOUNTER — Ambulatory Visit: Payer: BLUE CROSS/BLUE SHIELD | Admitting: Orthopedic Surgery

## 2020-10-07 ENCOUNTER — Ambulatory Visit: Payer: BLUE CROSS/BLUE SHIELD | Admitting: Internal Medicine

## 2020-10-07 NOTE — Progress Notes (Deleted)
Cardiology Office Note:    Date:  10/07/2020   ID:  Curtis Solis, DOB 05-13-1955, MRN [id number]  PCP:  Ma Rings, MD   [place]  Cardiologist:  Werner Lean, MD  Advanced Practice Provider:  No care team member to display Electrophysiologist:  None       Referring MD: [name], MD   CC: Follow up AF  History of Present Illness:    Curtis Solis is a 65 y.o. male with a hx of HTN, HLD, PAD with AAA complicated by Atrial flutter, Cardiac tamponade, with placement of pericardial drain; DM with CKD Stage IV- needing CCRT, Severe protein calorie malnutrition with PEG tube; s/p AKA.  Patient was seen 03/14/2020 after evaluation pose AAA surgery at [facility] and had ischemic bowel disease and a pericardial effusion.  Also complicated by adrenal insuffiencey.  Patient is currently at [facility].  After all of this is smoke free.  Seen Spring 2022.  In interim of this visit, patient has normal heart monitor, has recent AKI eval, in the ED, not started on [facility]  No further pericardial effusion.  Seen 10/07/20.  Patient notes that he is doing ***.  Since day prior/last visit notes *** changes.  Relevant interval testing or therapy include ***.   No chest pain or pressure ***.  No SOB/DOE*** and no PND/Orthopnea***.  No weight gain or leg swelling***.  No palpitations or syncope ***.  Ambulatory blood pressure ***.  Ambulatory BP not done.  Past Medical History:  Diagnosis Date   A-fib [facility])    During hospital stay   CKD (chronic kidney disease)    Hyperlipidemia    Hyperthyroidism    PVD (peripheral vascular disease) ([place])     Past Surgical History:  Procedure Laterality Date   ABDOMINAL AORTIC ANEURYSM REPAIR     ABOVE KNEE LEG AMPUTATION Left    AMPUTATION Right 03/11/2020   Procedure: RIGHT TRANSMETATARSAL AMPUTATION;  Surgeon: Newt Minion, MD;  Location: [facility];  Service: Orthopedics;  Laterality: Right;   BOWEL RESECTION      for dead bowel, s/p open abdomen   COLOSTOMY     GASTROSTOMY     left leg amputation     PERICARDIAL FLUID DRAINAGE     Placed for tamponade and removed during the [facility] hospitalization    Current Medications: No outpatient medications have been marked as taking for the 10/07/20 encounter (Appointment) with Werner Lean, MD.     Allergies:   Patient has no known allergies.   Social History   Socioeconomic History   Marital status: Married    Spouse name: Not on file   Number of children: Not on file   Years of education: Not on file   Highest education level: Not on file  Occupational History   Not on file  Tobacco Use   Smoking status: Former    Types: Cigarettes    Quit date: 09/26/2019    Years since quitting: 1.0   Smokeless tobacco: Never  Substance and Sexual Activity   Alcohol use: Yes   Drug use: Not on file   Sexual activity: Not on file  Other Topics Concern   Not on file  Social History Narrative   Not on file   Social Determinants of Health   Financial Resource Strain: Not on file  Food Insecurity: Not on file  Transportation Needs: Not on file  Physical Activity: Not on file  Stress: Not on  file  Social Connections: Not on file     Family History: The patient's family history includes Heart disease in an other family member. History of coronary artery disease notable for brother needing PCI. History of heart failure notable for no members. History of arrhythmia notable for no members.  ROS:   Please see the history of present illness.     All other systems reviewed and are negative.  EKGs/Labs/Other Studies Reviewed:    The following studies were reviewed today:  EKG:  EKG is  ordered today.  The ekg ordered today demonstrates  10/07/20: *** 04/02/20: SR 92 WNL  Cardiac Event Monitoring: Date: 04/27/20 Results: Patient had a minimum heart rate of 47 bpm, maximum heart rate of 149 bpm, and average heart rate of 73  bpm. Predominant underlying rhythm was sinus rhythm. Isolated PACs were occasional (3.1%), with rare couplets and triplets present. Isolated PVCs were rare (<1.0%), with rare couplets present. No evidence of complete heart block. No triggered and diary events.   No malignant arrhythmias.    Transthoracic Echocardiogram: Date: 05/01/20 Results:  1. Left ventricular ejection fraction, by estimation, is 55%. The left  ventricle has normal function. The left ventricle has no regional wall  motion abnormalities- there is abnormal septal motion with ventricular  thickening and normal strain. There is  mild concentric left ventricular hypertrophy. Left ventricular diastolic  parameters are consistent with Grade I diastolic dysfunction (impaired  relaxation). The average left ventricular global longitudinal strain is  -22.0 %. The global longitudinal  strain is normal.   2. Right ventricular systolic function is normal. The right ventricular  size is normal. Mildly increased right ventricular wall thickness. There  is normal pulmonary artery systolic pressure.   3. The mitral valve is normal in structure. No evidence of mitral valve  regurgitation.   4. The aortic valve is abnormal. There is mild calcification of the  aortic valve. There is mild thickening of the aortic valve. Aortic valve  regurgitation is not visualized. No aortic stenosis is present.   5. The inferior vena cava is normal in size with greater than 50%  respiratory variability, suggesting right atrial pressure of 3 mmHg.   Comparison(s): A prior study was performed on 09/04/2019. No prior  Echocardiogram. Resolution of pericardial effusion from chart review/care  everywhere. Unable to see prior echo in system.    Recent Labs: 03/11/2020: ALT 30; B Natriuretic Peptide 244.3 03/12/2020: Magnesium 2.5 03/14/2020: BUN 42; Creatinine, Ser 2.07; Potassium 4.9; Sodium 135 04/02/2020: Hemoglobin 9.3; Platelets 264  Recent Lipid  Panel No results found for: CHOL, TRIG, HDL, CHOLHDL, VLDL, LDLCALC, LDLDIRECT   Risk Assessment/Calculations:     N/A  Physical Exam:    VS:  There were no vitals taken for this visit.    Wt Readings from Last 3 Encounters:  03/14/20 145 lb 1 oz (65.8 kg)  02/06/20 109 lb (49.4 kg)    GEN: Well developed in no acute distress HEENT: Normal NECK: No JVD; No carotid bruits LYMPHATICS: No lymphadenopathy CARDIAC: RRR, no murmurs, rubs, gallops RESPIRATORY:  Clear to auscultation without rales, wheezing or rhonchi  ABDOMEN: PEG tube and ostomy C/D/I without bleeding MUSCULOSKELETAL:  No edema SKIN: Warm and dry no hematoma at AKA site (left) did not assess pressure ulcer NEUROLOGIC:  Alert and oriented x 3 PSYCHIATRIC:  Normal affect   ASSESSMENT:    No diagnosis found.  PLAN:    In order of problems listed above:  Question of Atrial Flutter  With HTN, DM PAD (vascular disease with recent amputations) CKD Stage IV Malnutrition  Pericardial effusion in the setting of AKI - continue current statin ***- likely would not tolerate BB meds on [place]    Medication Adjustments/Labs and Tests Ordered: Current medicines are reviewed at length with the patient today.  Concerns regarding medicines are outlined above.  No orders of the defined types were placed in this encounter.  No orders of the defined types were placed in this encounter.   There are no Patient Instructions on file for this visit.   Signed, Werner Lean, MD  10/07/2020 12:53 PM    [place]

## 2021-10-31 IMAGING — US US RENAL
1 series · 13 of 25 positions shown · non-contrast
Comparison: None available.

CLINICAL DATA: Initial evaluation for a KI, evaluate for
obstructive uropathy/urinary retention.

EXAM:
RENAL / URINARY TRACT ULTRASOUND COMPLETE

[Series 1: us renal · 13 of 84 slices shown]
[im 1/84]
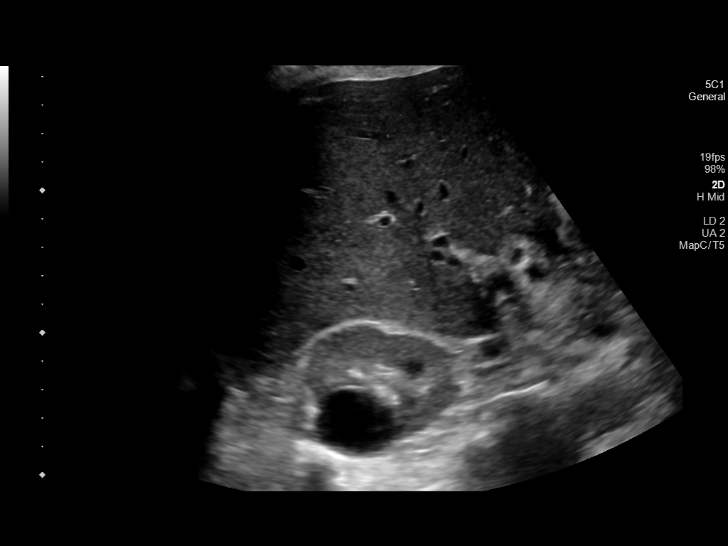
[im 7/84]
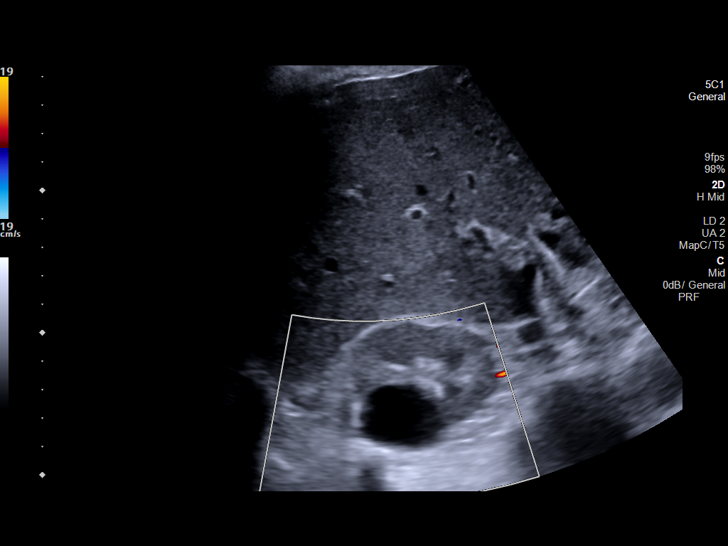
[im 14/84]
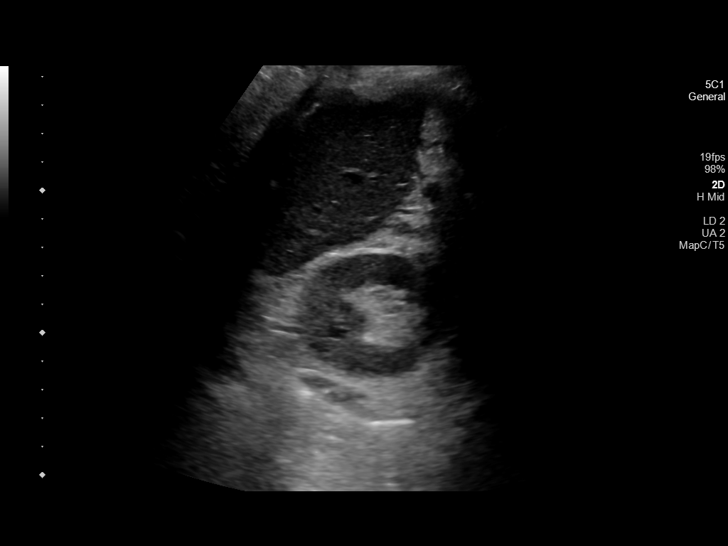
[im 21/84]
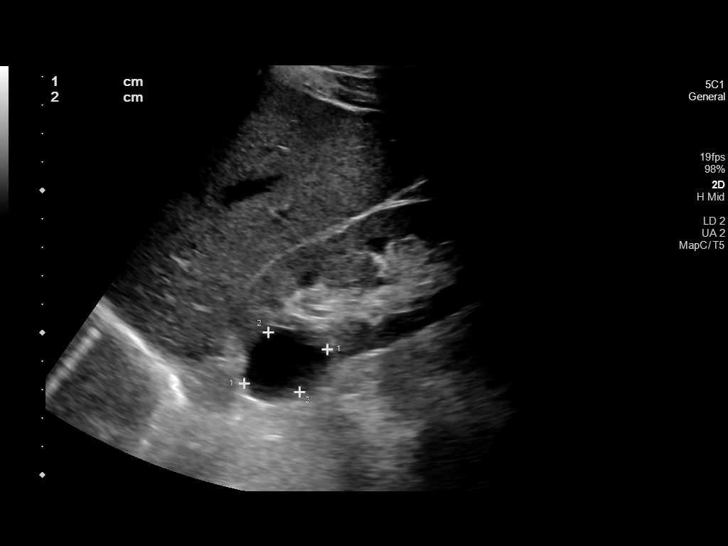
[im 28/84]
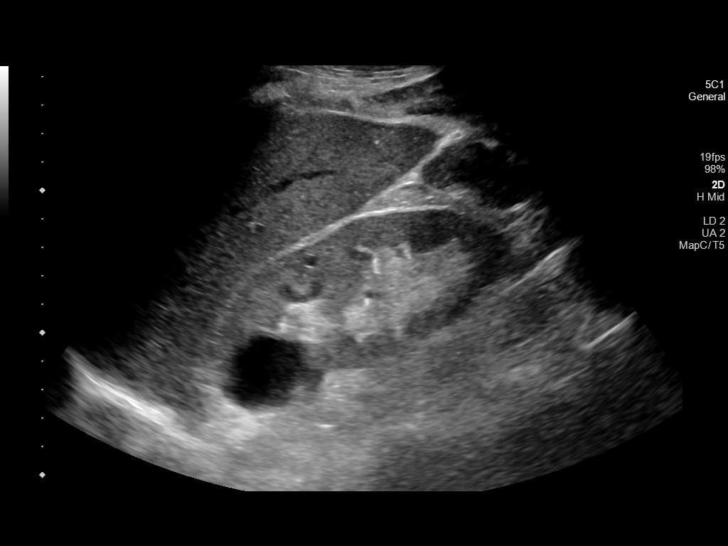
[im 35/84]
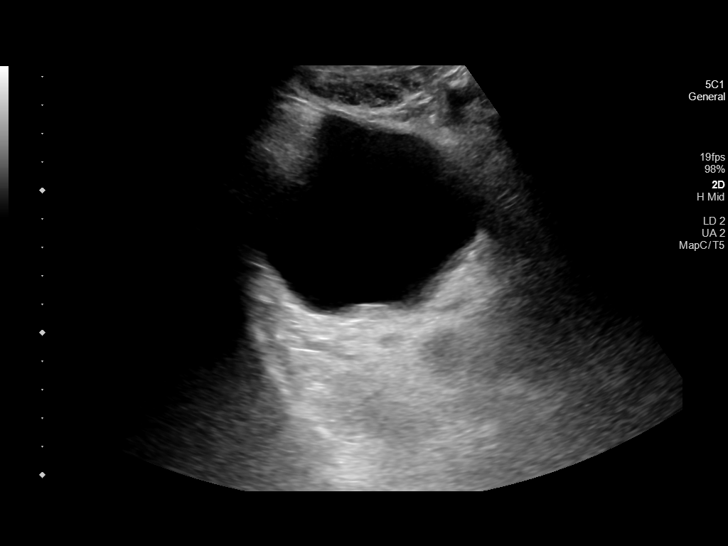
[im 42/84]
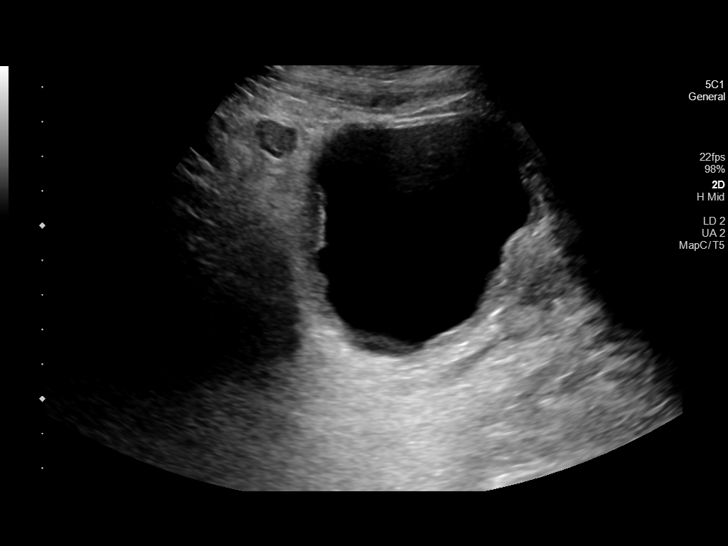
[im 49/84]
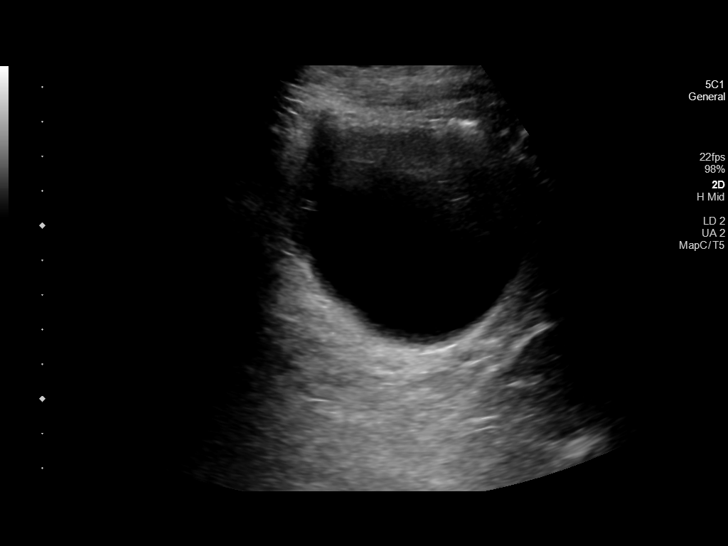
[im 56/84]
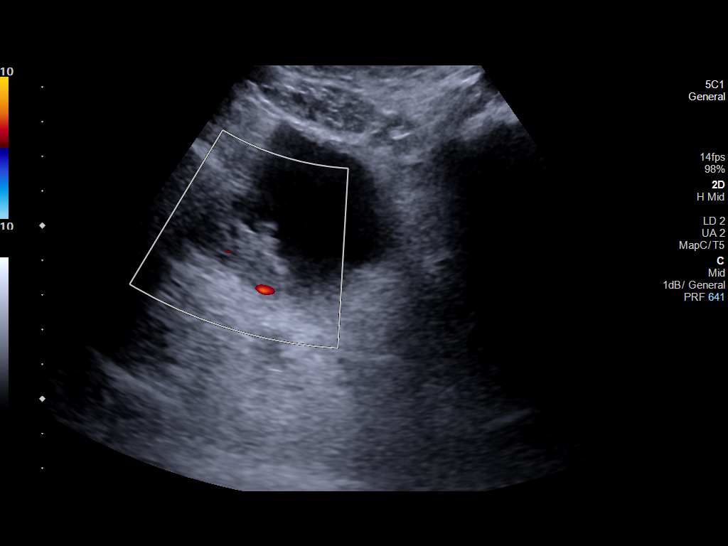
[im 63/84]
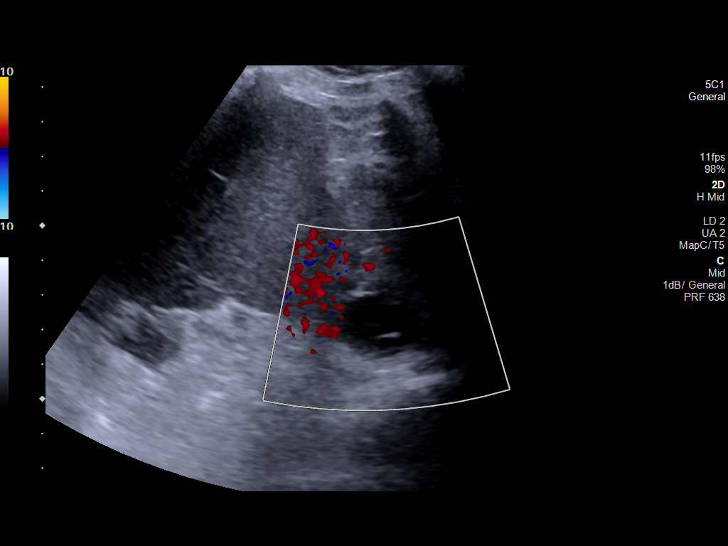
[im 70/84]
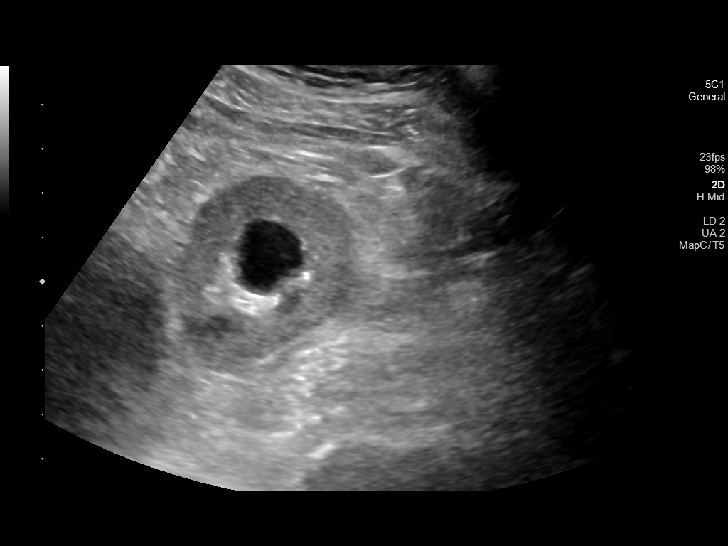
[im 77/84]
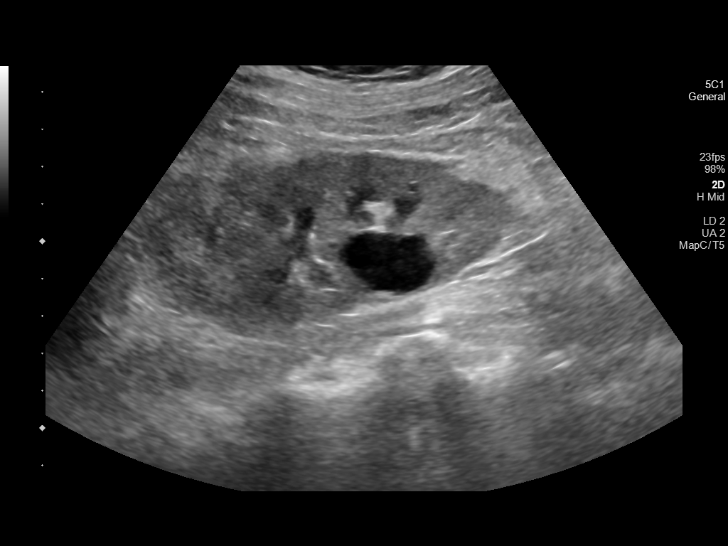
[im 84/84]
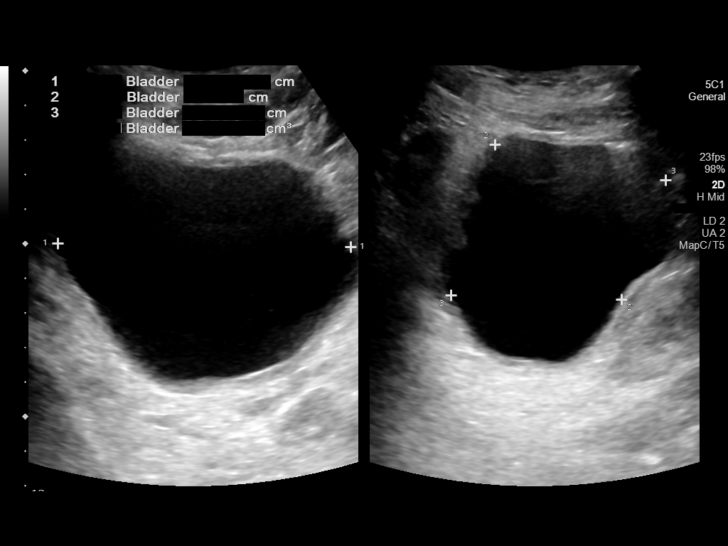

[13 of 25 positions shown; findings below may reference images not displayed]

FINDINGS: Right Kidney:

Renal measurements: 10.6 x 4.6 x 5.2 cm = volume: 134 mL. Mildly
increased echogenicity within the renal parenchyma. No
nephrolithiasis or hydronephrosis. 3.2 cm simple cyst present at the
upper pole.

Left Kidney:

Renal measurements: 11.5 x 4.5 x 4.4 cm = volume: 117 mL. Increased
echogenicity within the renal parenchyma. No nephrolithiasis or
hydronephrosis. 2.3 cm benign appearing cyst present at the upper
pole. Additional 2.6 cm simple cyst present at the lower pole.

Bladder:

Diffuse irregularity of the bladder wall is seen. While this could
reflect trabeculation related incomplete distension, a possible
focal bladder mass is difficult to exclude (image 56). Prevoid
volume measures 181 cc. Bilateral ureteral jets are visualized.

Other:

None.
IMPRESSION: 1. Increased echogenicity within the renal parenchyma, consistent
with medical renal disease.
2. No hydronephrosis.
3. Multifocal irregularity of the bladder wall. While this finding
may reflect trabeculation related to incomplete distension, a
possible focal bladder mass is difficult to exclude based on this
appearance. Further assessment with dedicated cross-sectional
imaging/CT urogram suggested for further evaluation.
4. Few scattered benign appearing renal cysts as above.

## 2021-12-04 IMAGING — DX DG FOOT COMPLETE 3+V*R*
3 series · 3 of 3 positions shown · non-contrast
Comparison: Radiograph 01/31/2020
COMPARISON: Radiograph 01/31/2020

Addendum:
CLINICAL DATA: Osteomyelitis

EXAM:
RIGHT FOOT COMPLETE - 3+ VIEW

[foot ap]
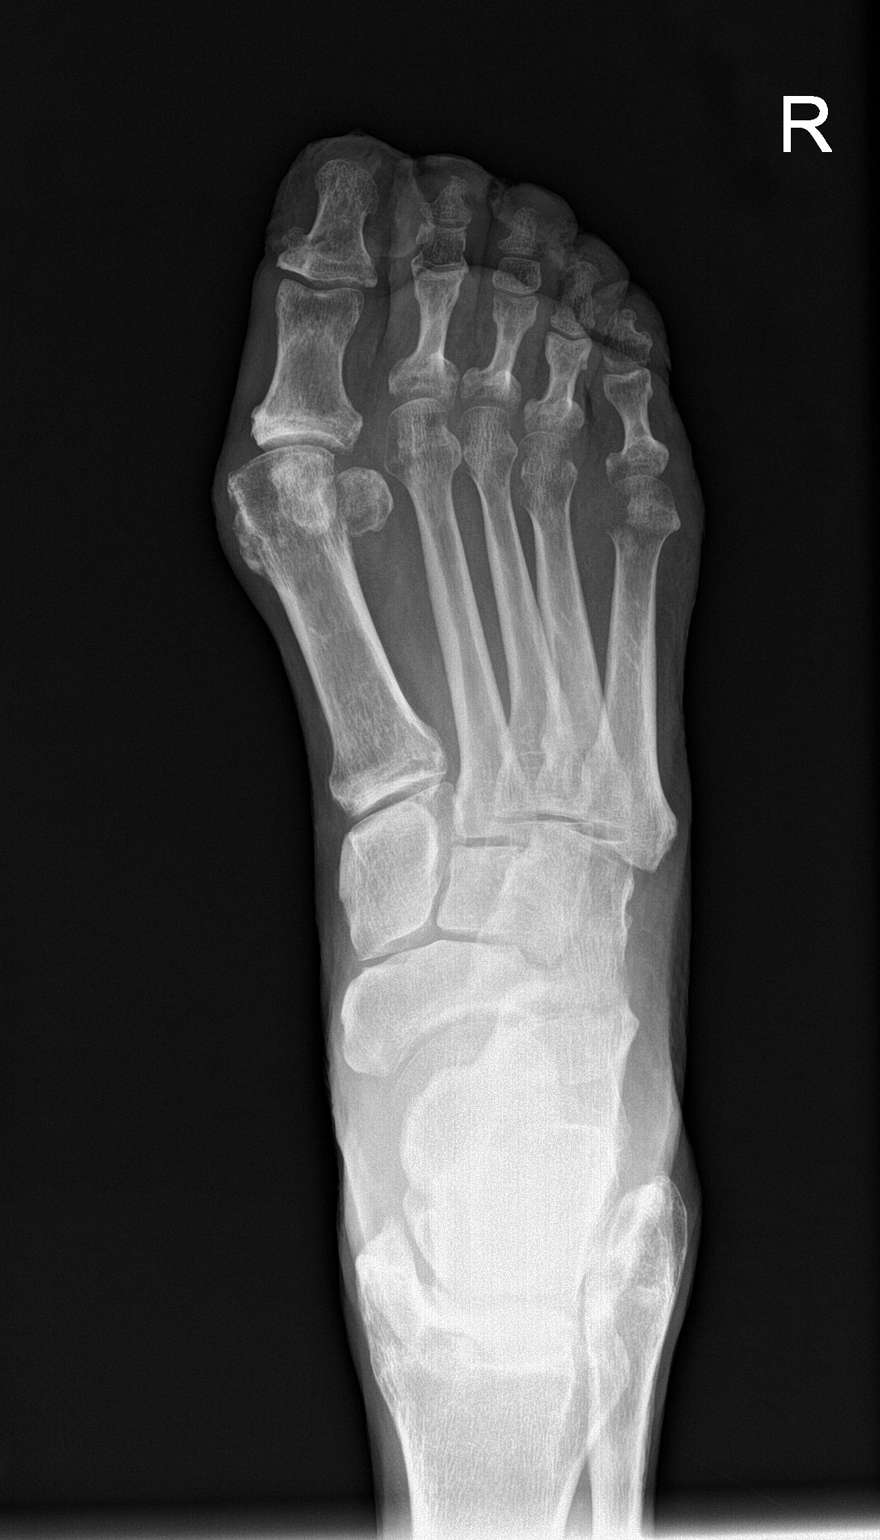

[foot obl]
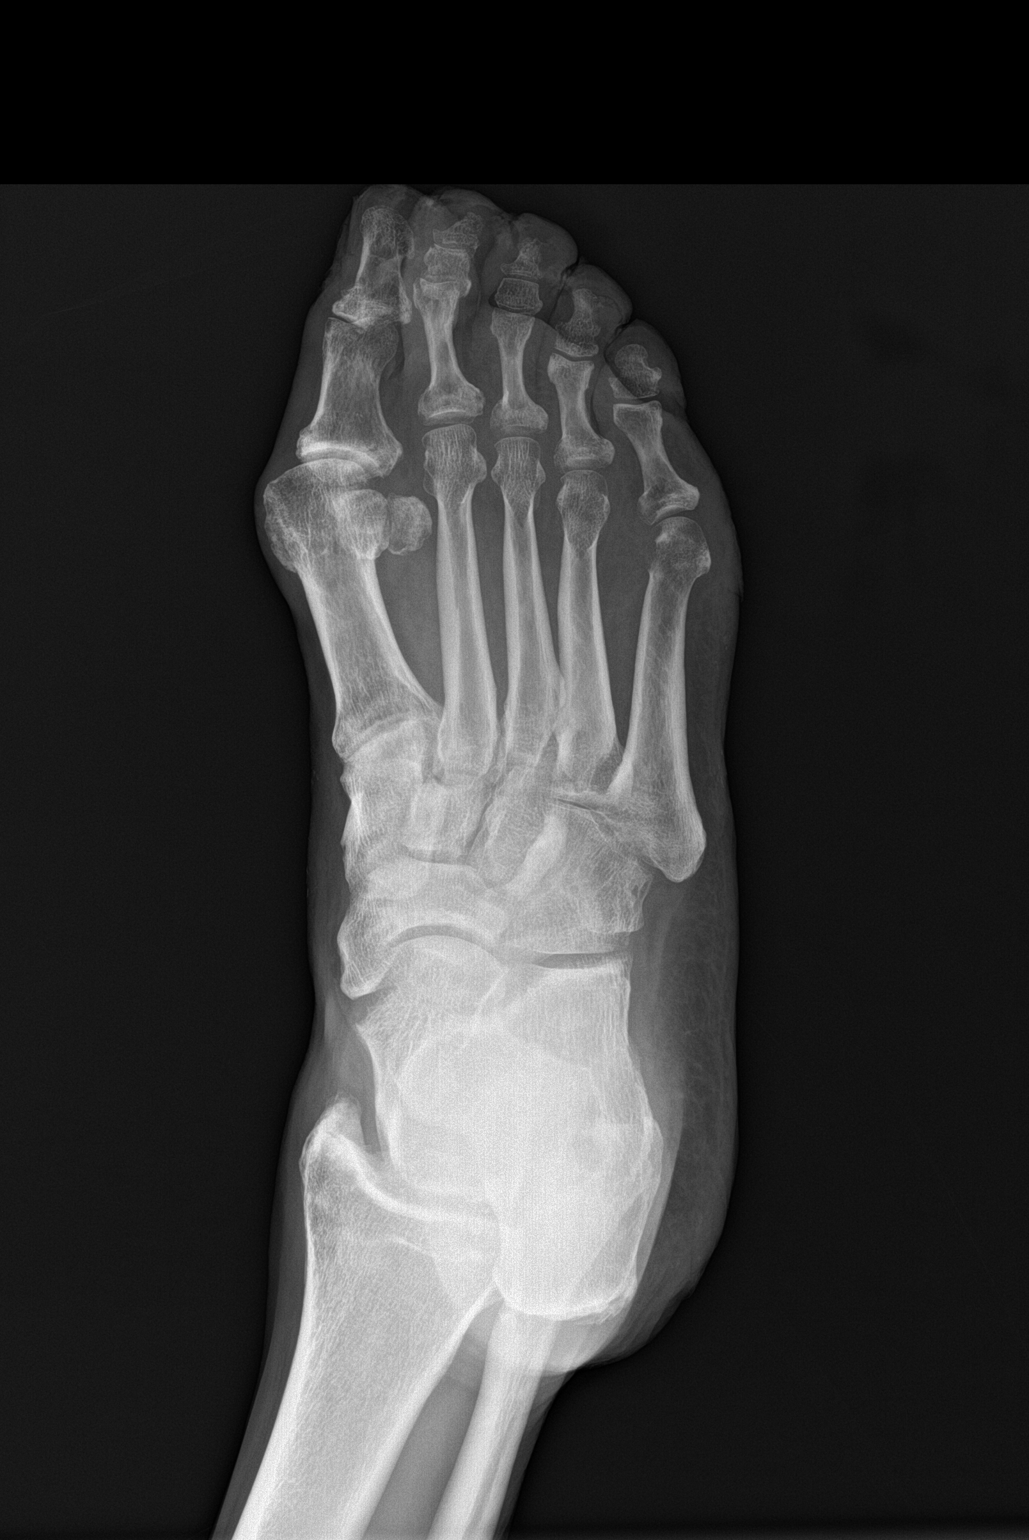

[foot lat]
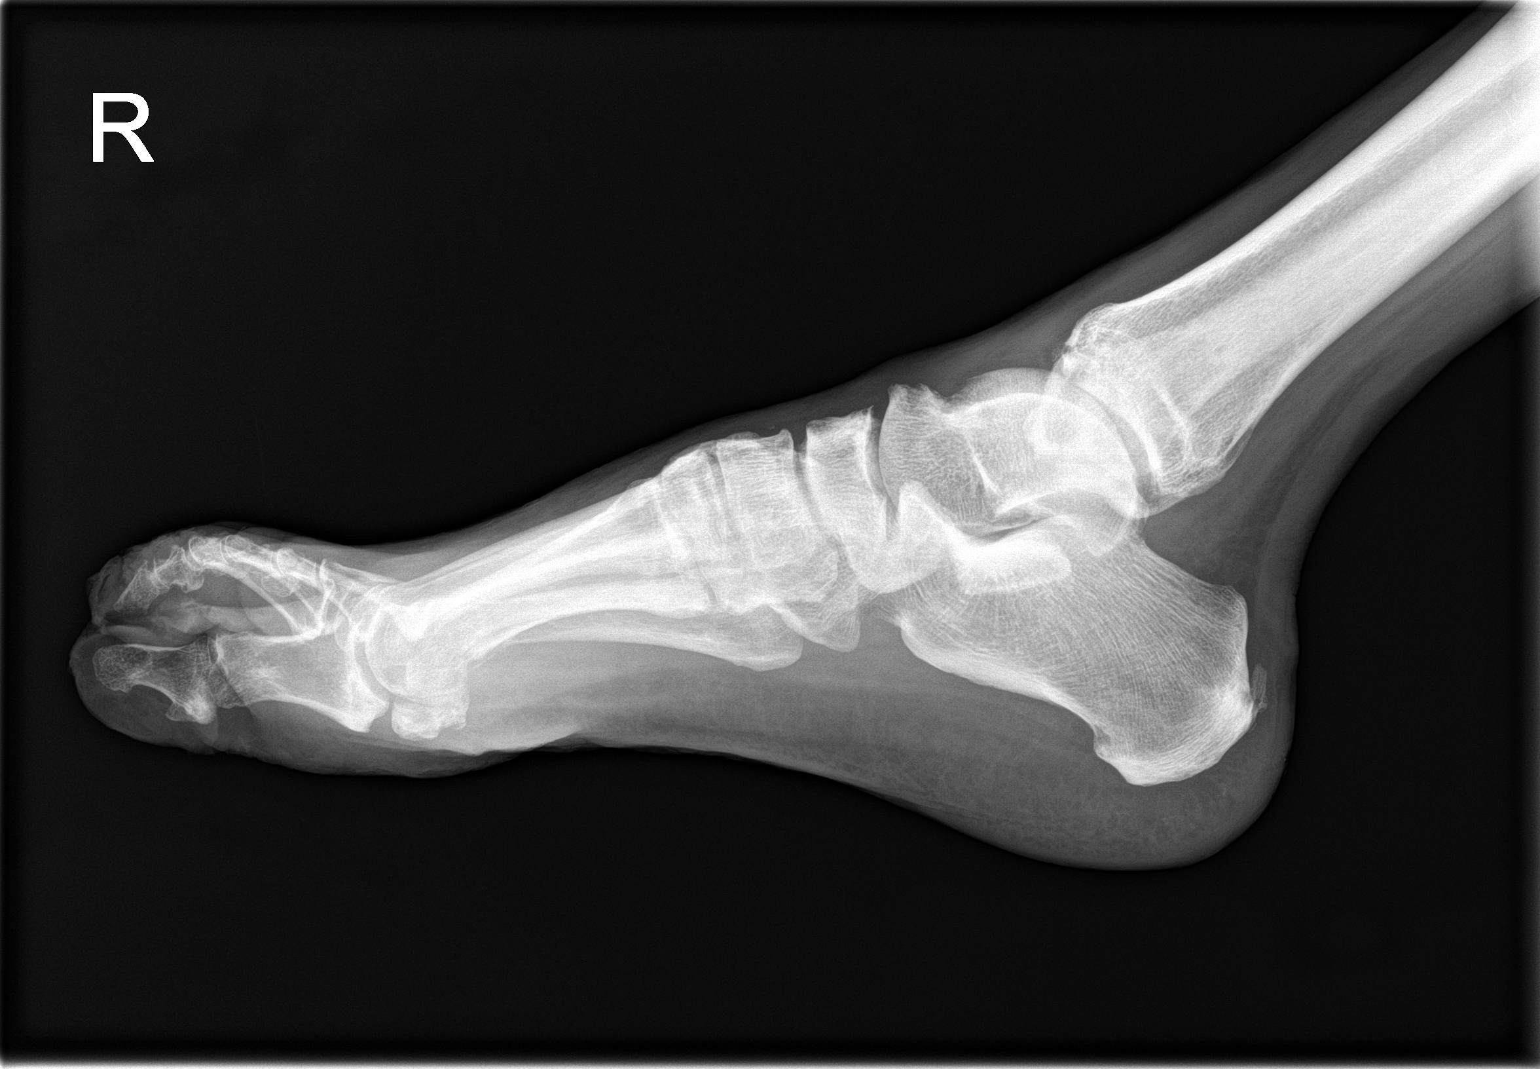

[3 of 3 positions shown; findings below may reference images not displayed]

FINDINGS: Increasingly conspicuous lucency involving the head of the fifth
metacarpal and base of the fifth proximal phalanx with adjacent
focal soft tissue swelling and linear lucency which may reflect a
site of laceration or ulcerative change. Poorly delineated cortical
margins seen along the base of the third and possibly fourth
proximal phalangeal bases as well. More diffuse mild soft tissue
swelling of the forefoot. More irregular ulcerative change in
potential soft tissue gas is seen involving the tips of the first
through fourth digits some of which may be associated with the nail
beds. Bones are diffusely demineralized which significantly limits
detection of subtle osseous abnormalities and injuries. Stable
hallux valgus deformity with advanced arthrosis at the first MTP.
Additional degenerative changes seen throughout the foot elsewhere.
Small ankle joint effusion is noted. Plantar calcaneal spurring.
IMPRESSION: 1. Increasingly conspicuous lucency involving the head of the fifth
metacarpal and base of the fifth proximal phalanx concerning for
osteomyelitis with adjacent focal soft tissue swelling and linear
lucency which may reflect a site of laceration or ulcerative change.
2. Poorly delineated cortical margins along the base of the third
and possibly fourth proximal phalangeal bases. Could reflect
additional site of osteomyelitis.
3. Extensive soft tissue swelling of the forefoot with some more
conspicuous irregularity of the soft tissues involving the tips of
the first through fourth digits. Recommend close clinical assessment
to assess for aggressive soft tissue infection.
4. Chroni[REDACTED]ux valgus and first MTP arthrosis with more diffuse
arthrosis elsewhere throughout the foot.
5. Ankle effusion without gross traumatic malalignment.

Currently attempting to contact the ordering provider with a
critical value result. Addendum will be submitted upon case
discussion.

ADDENDUM:
Critical Value/emergent results were called by telephone at the time
of interpretation on 03/05/2020 at [DATE] to provider Bambucafe Tarla,
who verbally acknowledged these results.

*** End of Addendum ***
FINDINGS: Increasingly conspicuous lucency involving the head of the fifth
metacarpal and base of the fifth proximal phalanx with adjacent
focal soft tissue swelling and linear lucency which may reflect a
site of laceration or ulcerative change. Poorly delineated cortical
margins seen along the base of the third and possibly fourth
proximal phalangeal bases as well. More diffuse mild soft tissue
swelling of the forefoot. More irregular ulcerative change in
potential soft tissue gas is seen involving the tips of the first
through fourth digits some of which may be associated with the nail
beds. Bones are diffusely demineralized which significantly limits
detection of subtle osseous abnormalities and injuries. Stable
hallux valgus deformity with advanced arthrosis at the first MTP.
Additional degenerative changes seen throughout the foot elsewhere.
Small ankle joint effusion is noted. Plantar calcaneal spurring.
IMPRESSION: 1. Increasingly conspicuous lucency involving the head of the fifth
metacarpal and base of the fifth proximal phalanx concerning for
osteomyelitis with adjacent focal soft tissue swelling and linear
lucency which may reflect a site of laceration or ulcerative change.
2. Poorly delineated cortical margins along the base of the third
and possibly fourth proximal phalangeal bases. Could reflect
additional site of osteomyelitis.
3. Extensive soft tissue swelling of the forefoot with some more
conspicuous irregularity of the soft tissues involving the tips of
the first through fourth digits. Recommend close clinical assessment
to assess for aggressive soft tissue infection.
4. Chroni[REDACTED]ux valgus and first MTP arthrosis with more diffuse
arthrosis elsewhere throughout the foot.
5. Ankle effusion without gross traumatic malalignment.

Currently attempting to contact the ordering provider with a
critical value result. Addendum will be submitted upon case
discussion.

## 2022-01-15 IMAGING — DX DG ABDOMEN 1V
1 series · 1 of 1 positions shown · non-contrast
Comparison: None.

CLINICAL DATA: Peg tube placement.

EXAM:
ABDOMEN - 1 VIEW

[abdomen kub]
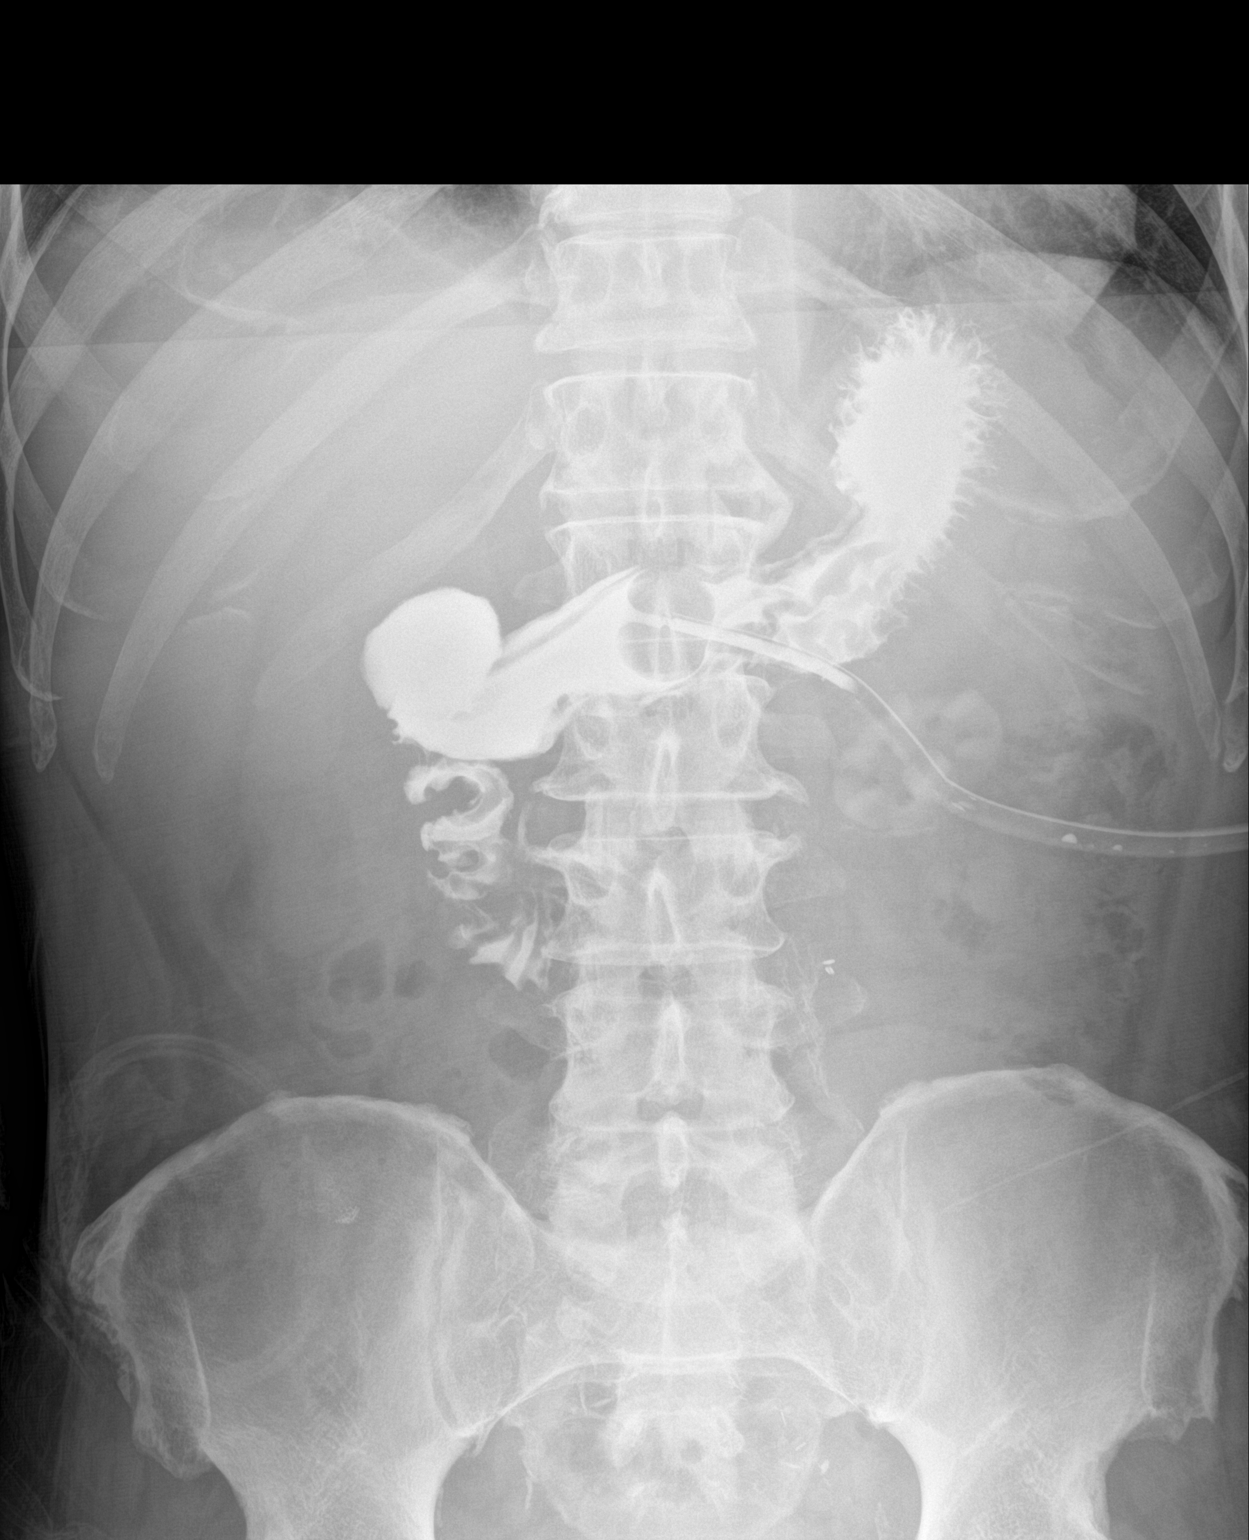

[1 of 1 positions shown; findings below may reference images not displayed]

FINDINGS: A percutaneous gastrostomy tube is seen with its distal tip and
insufflator bulb overlying the gastric antrum. Radiopaque contrast
is noted throughout the gastric lumen and proximal duodenum
following injection of radiopaque contrast material. The bowel gas
pattern is normal. A right lower quadrant ostomy site is seen.
Radiopaque surgical sutures are also noted within this region. No
radio-opaque calculi or other significant radiographic abnormality
are seen.
IMPRESSION: Percutaneous gastrostomy tube positioning, as described above.
